# Patient Record
Sex: Female | Born: 1990 | Race: White | Hispanic: No | Marital: Married | Smoking: Never smoker
Health system: Southern US, Community
[De-identification: ages and names within clinical notes are randomized; demographics above are authoritative.]

## PROBLEM LIST (undated history)

## (undated) ENCOUNTER — Ambulatory Visit: Admission: EM | Payer: 59

## (undated) DIAGNOSIS — F32A Depression, unspecified: Secondary | ICD-10-CM

## (undated) DIAGNOSIS — R1310 Dysphagia, unspecified: Secondary | ICD-10-CM

## (undated) DIAGNOSIS — E559 Vitamin D deficiency, unspecified: Secondary | ICD-10-CM

## (undated) DIAGNOSIS — R070 Pain in throat: Secondary | ICD-10-CM

## (undated) DIAGNOSIS — F419 Anxiety disorder, unspecified: Secondary | ICD-10-CM

## (undated) HISTORY — DX: Dysphagia, unspecified: R13.10

## (undated) HISTORY — DX: Pain in throat: R07.0

## (undated) HISTORY — PX: CHOLECYSTECTOMY: SHX55

---

## 2020-04-11 ENCOUNTER — Ambulatory Visit (INDEPENDENT_AMBULATORY_CARE_PROVIDER_SITE_OTHER): Payer: 59 | Admitting: Family Medicine

## 2020-04-11 ENCOUNTER — Other Ambulatory Visit: Payer: Self-pay

## 2020-04-11 ENCOUNTER — Ambulatory Visit: Payer: Self-pay | Admitting: Family Medicine

## 2020-04-11 ENCOUNTER — Encounter: Payer: Self-pay | Admitting: Family Medicine

## 2020-04-11 VITALS — BP 120/64 | HR 96 | Temp 99.3°F | Ht 62.0 in | Wt 239.4 lb

## 2020-04-11 DIAGNOSIS — L301 Dyshidrosis [pompholyx]: Secondary | ICD-10-CM | POA: Diagnosis not present

## 2020-04-11 DIAGNOSIS — R61 Generalized hyperhidrosis: Secondary | ICD-10-CM | POA: Diagnosis not present

## 2020-04-11 MED ORDER — TRIAMCINOLONE ACETONIDE 0.1 % EX CREA: 1.0000 "application " | TOPICAL_CREAM | Freq: Two times a day (BID) | CUTANEOUS | 0 refills | Status: DC

## 2020-04-11 NOTE — Patient Instructions (Addendum)
You can use over the counter Domeboro solution if you develop oozing and blisters to dry them up  Try to keep your hands and feet dry.   Dyshidrotic Eczema Dyshidrotic eczema, also known as pompholyx, is a type of eczema that causes very itchy, fluid-filled blisters (vesicles) to form on the hands and feet. It is more common before age 32, though it can affect people of any age. There is no cure, but treatment and certain lifestyle changes can help relieve symptoms. What are the causes? The cause of this condition is not known. What increases the risk? You are more likely to develop this condition if:  You wash your hands frequently.  You have a personal or family history of eczema, allergies, asthma, or hay fever.  You are allergic to metals, such as nickel or cobalt.  You work with cement.  You smoke. What are the signs or symptoms? Symptoms of this condition may affect the hands, the feet, or both. Symptoms may come and go (recur), and may include:  Severe itching. This may happen before blisters appear.  Blisters. These may form suddenly. ? In the early stages, blisters may form near the fingertips. ? In severe cases, blisters may grow to large blister masses (bullae). ? Blisters resolve in 2-3 weeks without bursting. This is followed by a dry phase in which itching eases.  Pain and swelling.  Cracks or long, narrow openings (fissures) in the skin.  Severe dryness.  Ridges on the nails. How is this diagnosed? This condition may be diagnosed based on:  Your symptoms and a physical exam.  Your medical history.  Skin scrapings to rule out a fungal infection.  Testing a swab of fluid for bacteria (culture).  Removing a small piece of skin (biopsy) to test for infection or to rule out other conditions.  Skin patch tests. These tests involve using patches that contain possible allergens and placing them on your back. Your health care provider will wait a few days and  then check to see if an allergic reaction occurred. These tests may be done if your health care provider suspects allergic reactions, or to rule out other types of eczema. You may be referred to a health care provider who specializes in skin conditions (dermatologist) to help diagnose and treat this condition. How is this treated? There is no cure for this condition, but treatment can help relieve symptoms. Depending on the amount and severity of the blisters, your health care provider may suggest:  Avoiding allergens, irritants, or triggers that worsen symptoms. This may involve lifestyle changes, such as: ? Using different lotions or soaps. ? Avoiding hot weather or places that will cause you to sweat a lot. ? Managing stress with coping techniques, such as relaxation and exercise, and asking for help when you need it. ? Diet changes as recommended by your health care provider.  Using a clean, damp towel (cool compress) to relieve symptoms.  Soaking in a bath that contains a type of salt that relieves irritation (aluminum acetate soaks).  Medicines, such as: ? Medicine taken by mouth to reduce itching (oral antihistamines). ? Medicine applied to the skin to reduce swelling and irritation (topical corticosteroids). ? Medicine that reduces the activity of the body's disease-fighting system (immunosuppressants) to treat inflammation. This may be given in severe cases. ? Antibiotic medicines to treat bacterial infection.  Light therapy (phototherapy). This involves shining ultraviolet (UV) light on the affected skin in order to reduce itchiness and inflammation.  Follow these instructions at home: Bathing and skin care  Wash skin gently. After bathing or washing your hands, pat your skin dry. Avoid rubbing your skin.  Remove all jewelry before bathing. If the skin under the jewelry stays wet, blisters may form or get worse.  Apply cool compresses as told by your health care provider. To  do this: ? Soak a clean towel in cool water. ? Wring out excess water until towel is damp. ? Place the towel over the affected skin. Leave the towel on for 20 minutes at a time, 2-3 times a day.  Use mild soaps, cleansers, and lotions that do not contain dyes, perfumes, or other irritants.  Keep your skin hydrated. To do this: ? Avoid very hot water. Take lukewarm baths or showers. ? Apply moisturizer within 3 minutes of bathing. This locks in moisture.   Medicines  Take and apply over-the-counter and prescription medicines only as told by your health care provider.  If you were prescribed an antibiotic medicine, take or apply it as told by your health care provider. Do not stop using the antibiotic even if you start to feel better. General instructions  Do not use any products that contain nicotine or tobacco. These include cigarettes, chewing tobacco, and vaping devices, such as e-cigarettes. If you need help quitting, ask your health care provider.  Identify and avoid triggers and allergens.  Keep fingernails short to avoid breaking the skin while scratching.  Use waterproof gloves to protect your hands when doing work that keeps your hands wet for a long time.  Wear socks to keep your feet dry.  Keep all follow-up visits. This is important. Contact a health care provider if:  You have symptoms that do not go away.  You have signs of infection, such as: ? Crusting, pus, or a bad smell. ? More redness, swelling, or pain. ? Increased warmth in the affected area. Get help right away if:  Your skin gets streaking redness with associated pain. Summary  Dyshidrotic eczema, also known as pompholyx, is a type of eczema that causes very itchy, fluid-filled blisters (vesicles) to form on the hands and feet.  The cause of this condition is not known.  There is no cure for this condition, but treatment can help relieve symptoms. Treatment depends on the amount and severity of the  blisters.  Use mild soaps, cleansers, and lotions that do not contain dyes, perfumes, or other irritants. Keep your skin hydrated. This information is not intended to replace advice given to you by your health care provider. Make sure you discuss any questions you have with your health care provider. Document Revised: 11/28/2019 Document Reviewed: 11/28/2019 Elsevier Patient Education  2021 [employer].    Obgyn Offices:   [facility] [address] [place], [place] [address] [phone]  [facility] Address: [address] [place], [place] [address] Phone: [phone]  [facility] [address] [place], [place] [address] Phone: [phone]    [facility] [address] [place] [address] [place], [place] [address] Phone: [phone]
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
## 2020-04-19 ENCOUNTER — Telehealth: Payer: Self-pay | Admitting: Family Medicine

## 2020-04-19 NOTE — Telephone Encounter (Signed)
Requested records received from Dr. Chuck Hint

## 2020-05-07 ENCOUNTER — Telehealth: Payer: Self-pay | Admitting: Family Medicine

## 2020-05-07 NOTE — Telephone Encounter (Signed)
Requested records received from [facility]

## 2020-07-11 ENCOUNTER — Telehealth (INDEPENDENT_AMBULATORY_CARE_PROVIDER_SITE_OTHER): Payer: 59 | Admitting: Medical

## 2020-07-11 ENCOUNTER — Other Ambulatory Visit: Payer: Self-pay

## 2020-07-11 VITALS — BP 122/80 | HR 84 | Temp 99.1°F | Ht 62.0 in | Wt 235.0 lb

## 2020-07-11 DIAGNOSIS — Z9049 Acquired absence of other specified parts of digestive tract: Secondary | ICD-10-CM | POA: Diagnosis not present

## 2020-07-11 DIAGNOSIS — R319 Hematuria, unspecified: Secondary | ICD-10-CM | POA: Diagnosis not present

## 2020-07-11 DIAGNOSIS — R1012 Left upper quadrant pain: Secondary | ICD-10-CM | POA: Diagnosis not present

## 2020-07-11 DIAGNOSIS — R112 Nausea with vomiting, unspecified: Secondary | ICD-10-CM | POA: Insufficient documentation

## 2020-07-11 HISTORY — DX: Nausea with vomiting, unspecified: R11.2

## 2020-07-11 HISTORY — DX: Left upper quadrant pain: R10.12

## 2020-07-11 LAB — POCT URINALYSIS DIP (PROADVANTAGE DEVICE)
Bilirubin, UA: NEGATIVE
Glucose, UA: NEGATIVE mg/dL
Ketones, POC UA: NEGATIVE mg/dL
Leukocytes, UA: NEGATIVE
Nitrite, UA: NEGATIVE
Protein Ur, POC: NEGATIVE mg/dL
Specific Gravity, Urine: 1.01
Urobilinogen, Ur: 0.2
pH, UA: 6 (ref 5.0–8.0)

## 2020-07-11 MED ORDER — ONDANSETRON 4 MG PO TBDP: 4.0000 mg | ORAL_TABLET | Freq: Three times a day (TID) | ORAL | 0 refills | Status: DC | PRN

## 2020-07-11 NOTE — Progress Notes (Signed)
Subjective: Chief Complaint  Patient presents with  . Abdominal Pain    Nausea, vomiting and stomach pain started yesterday   Here for abdominal pain.  Started yesterday morning with pain in the upper abdomen mostly, left sometimes radiating to the right.  Had nausea and vomiting last night and had some more nausea and vomiting today at lunch.  Cannot keep anything down.  She has persistent nausea.  The pain is intermittent.  She does get some pain worse after eating. She has a history of chronic loose stools and she did have a bowel movement Monday that was loose.  No bowel movement so far the last 2 days.  he denies any urinary symptoms.  No burning with urination, no frequency, no urgency, no blood in the urine or stool, no blood in the vomit.  She has a history of gallbladder removed in the past.  No history of kidney stones.  Some left back pain.  No shoulder pain, no fever.  No sick contacts.  No respiratory complaint.  She notes 1 prior episode of pancreatitis in the remote past at the same time she was having gallbladder issues but none since.  She occasionally drinks an alcoholic beverage but not regularly and none in the last few days.  No injury or trauma. She does take ibuprofen daily once daily.  Sometimes in past GERD.  She notes hx/o hiatal hernia prior. She notes occasional alcohol use but none heavy.    No concern for pregnancy.  Has had tubal and has IUD.  No other aggravating or relieving factors.    No other c/o.   No past medical history on file.  Current Outpatient Medications on File Prior to Visit  Medication Sig Dispense Refill  . cetirizine (ZYRTEC) 10 MG tablet Take by mouth.    . clonazePAM (KLONOPIN) 0.5 MG tablet Take 0.5 mg by mouth daily as needed.    . ergocalciferol (VITAMIN D2) 1.25 MG (50000 UT) capsule     . ibuprofen (ADVIL) 600 MG tablet     . loratadine (CLARITIN) 10 MG tablet Take by mouth.    . prazosin (MINIPRESS) 5 MG capsule     . sertraline  (ZOLOFT) 100 MG tablet     . traZODone (DESYREL) 150 MG tablet Take 150 mg by mouth at bedtime.    . triamcinolone (KENALOG) 0.1 % Apply 1 application topically 2 (two) times daily. 30 g 0  . busPIRone (BUSPAR) 7.5 MG tablet Take 7.5 mg by mouth 3 (three) times daily. (Patient not taking: Reported on 07/11/2020)    . QUEtiapine (SEROQUEL) 50 MG tablet Take 50 mg by mouth at bedtime. (Patient not taking: Reported on 07/11/2020)     No current facility-administered medications on file prior to visit.     The following portions of the patient's history were reviewed and updated as appropriate: allergies, current medications, past family history, past medical history, past social history, past surgical history and problem list.  ROS Otherwise as in subjective above    Objective: Ht 5\' 2"  (1.575 m)   Wt 235 lb (106.6 kg)   BMI 42.98 kg/m   General appearance: alert, no distress, well developed, well nourished, white female Oral cavity: MMM, no lesions Neck: supple, no lymphadenopathy, no thyromegaly, no masses Heart: RRR, normal S1, S2, no murmurs Lungs: CTA bilaterally, no wheezes, rhonchi, or rales Abdomen: +somewhat decreased bs, port surgical scars noted right side, soft, mild to moderate generalized left upper and epigastric tenderness, otherwise non  tender, non distended, no masses, no hepatomegaly, no splenomegaly Back: nontender, no CVA tenderness Pulses: 2+ radial pulses, 2+ pedal pulses, normal cap refill Ext: no edema     Assessment: Encounter Diagnoses  Name Primary?  . Left upper quadrant abdominal pain Yes  . Nausea and vomiting, intractability of vomiting not specified, unspecified vomiting type   . S/P cholecystectomy      Plan: Discussed symptoms, possible differential which could include dyspepsia, hiatal hernia, GERD, pancreatitis or other.  Labs today as below.  She will use Tylenol for pain.  She declined stronger pain medication.  I advise no solid food  for the next 24 hours only clear fluids.  Can use Zofran for nausea.  Follow-up pending labs.  If worse in the meantime go to the emergency department  Hollee was seen today for abdominal pain.  Diagnoses and all orders for this visit:  Left upper quadrant abdominal pain -     CBC with Differential/Platelet -     Lipase -     Comprehensive metabolic panel -     POCT Urinalysis DIP (Proadvantage Device) -     POCT urine pregnancy  Nausea and vomiting, intractability of vomiting not specified, unspecified vomiting type -     CBC with Differential/Platelet -     Lipase -     Comprehensive metabolic panel -     POCT Urinalysis DIP (Proadvantage Device)  S/P cholecystectomy  Other orders -     ondansetron (ZOFRAN ODT) 4 MG disintegrating tablet; Take 1 tablet (4 mg total) by mouth every 8 (eight) hours as needed for nausea or vomiting.    Follow up: pending labs

## 2020-07-11 NOTE — Addendum Note (Signed)
Addended by: Victorio Palm on: 07/11/2020 04:11 PM   Modules accepted: Orders

## 2020-07-12 LAB — CBC WITH DIFFERENTIAL/PLATELET
Basophils Absolute: 0.1 10*3/uL (ref 0.0–0.2)
Basos: 1 %
EOS (ABSOLUTE): 0.3 10*3/uL (ref 0.0–0.4)
Eos: 3 %
Hematocrit: 40.1 % (ref 34.0–46.6)
Hemoglobin: 13.7 g/dL (ref 11.1–15.9)
Immature Grans (Abs): 0 10*3/uL (ref 0.0–0.1)
Immature Granulocytes: 0 %
Lymphocytes Absolute: 2.5 10*3/uL (ref 0.7–3.1)
Lymphs: 27 %
MCH: 31.8 pg (ref 26.6–33.0)
MCHC: 34.2 g/dL (ref 31.5–35.7)
MCV: 93 fL (ref 79–97)
Monocytes Absolute: 0.7 10*3/uL (ref 0.1–0.9)
Monocytes: 7 %
Neutrophils Absolute: 5.6 10*3/uL (ref 1.4–7.0)
Neutrophils: 62 %
Platelets: 275 10*3/uL (ref 150–450)
RBC: 4.31 x10E6/uL (ref 3.77–5.28)
RDW: 11.8 % (ref 11.7–15.4)
WBC: 9.2 10*3/uL (ref 3.4–10.8)

## 2020-07-12 LAB — COMPREHENSIVE METABOLIC PANEL
ALT: 45 IU/L — ABNORMAL HIGH (ref 0–32)
AST: 23 IU/L (ref 0–40)
Albumin/Globulin Ratio: 1.9 (ref 1.2–2.2)
Albumin: 4.5 g/dL (ref 3.8–4.8)
Alkaline Phosphatase: 79 IU/L (ref 44–121)
BUN/Creatinine Ratio: 15 (ref 9–23)
BUN: 12 mg/dL (ref 6–20)
Bilirubin Total: 0.4 mg/dL (ref 0.0–1.2)
CO2: 23 mmol/L (ref 20–29)
Calcium: 9.1 mg/dL (ref 8.7–10.2)
Chloride: 104 mmol/L (ref 96–106)
Creatinine, Ser: 0.81 mg/dL (ref 0.57–1.00)
Globulin, Total: 2.4 g/dL (ref 1.5–4.5)
Glucose: 89 mg/dL (ref 65–99)
Potassium: 4.3 mmol/L (ref 3.5–5.2)
Sodium: 139 mmol/L (ref 134–144)
Total Protein: 6.9 g/dL (ref 6.0–8.5)
eGFR: 99 mL/min/{1.73_m2} (ref >59)

## 2020-07-12 LAB — LIPASE: Lipase: 26 U/L (ref 14–72)

## 2020-07-14 LAB — URINE CULTURE

## 2020-10-16 ENCOUNTER — Other Ambulatory Visit: Payer: Self-pay

## 2020-10-16 ENCOUNTER — Ambulatory Visit: Payer: 59 | Admitting: Podiatry

## 2020-10-16 ENCOUNTER — Ambulatory Visit (INDEPENDENT_AMBULATORY_CARE_PROVIDER_SITE_OTHER): Payer: 59

## 2020-10-16 DIAGNOSIS — L600 Ingrowing nail: Secondary | ICD-10-CM

## 2020-10-16 DIAGNOSIS — M778 Other enthesopathies, not elsewhere classified: Secondary | ICD-10-CM

## 2020-10-16 MED ORDER — NEOMYCIN-POLYMYXIN-HC 1 % OT SOLN: OTIC | 1 refills | Status: DC

## 2020-10-16 NOTE — Patient Instructions (Addendum)

## 2020-10-17 NOTE — Progress Notes (Signed)
Subjective:  Patient ID: Cynthia Frazier, female    DOB: 04-Jan-1991,  MRN: [id number] HPI Chief Complaint  Patient presents with   Foot Pain    left foot pain, ingrown great toenail    30 y.o. female presents with the above complaint.   ROS: Denies fever chills nausea vomiting muscle aches pains calf pain back pain chest pain shortness of breath.  No past medical history on file. No past surgical history on file.  Current Outpatient Medications:    NEOMYCIN-POLYMYXIN-HYDROCORTISONE (CORTISPORIN) 1 % SOLN OTIC solution, Apply 1-2 drops to toe BID after soaking, Disp: 10 mL, Rfl: 1   busPIRone (BUSPAR) 7.5 MG tablet, Take 7.5 mg by mouth 3 (three) times daily. (Patient not taking: Reported on 07/11/2020), Disp: , Rfl:    cetirizine (ZYRTEC) 10 MG tablet, Take by mouth., Disp: , Rfl:    clonazePAM (KLONOPIN) 0.5 MG tablet, Take 0.5 mg by mouth daily as needed., Disp: , Rfl:    ergocalciferol (VITAMIN D2) 1.25 MG (50000 UT) capsule, , Disp: , Rfl:    ibuprofen (ADVIL) 600 MG tablet, , Disp: , Rfl:    loratadine (CLARITIN) 10 MG tablet, Take by mouth., Disp: , Rfl:    ondansetron (ZOFRAN ODT) 4 MG disintegrating tablet, Take 1 tablet (4 mg total) by mouth every 8 (eight) hours as needed for nausea or vomiting., Disp: 20 tablet, Rfl: 0   prazosin (MINIPRESS) 5 MG capsule, , Disp: , Rfl:    QUEtiapine (SEROQUEL) 50 MG tablet, Take 50 mg by mouth at bedtime. (Patient not taking: Reported on 07/11/2020), Disp: , Rfl:    sertraline (ZOLOFT) 100 MG tablet, , Disp: , Rfl:    traZODone (DESYREL) 150 MG tablet, Take 150 mg by mouth at bedtime., Disp: , Rfl:    triamcinolone (KENALOG) 0.1 %, Apply 1 application topically 2 (two) times daily., Disp: 30 g, Rfl: 0  Allergies  Allergen Reactions   Sumatriptan Anaphylaxis   Vancomycin Anaphylaxis   Latex Hives   Levofloxacin Rash   Review of Systems Objective:  There were no vitals filed for this visit.  General: Well developed, nourished, in no  acute distress, alert and oriented x3   Dermatological: Skin is warm, dry and supple bilateral. Nails x 10 are well maintained; remaining integument appears unremarkable at this time. There are no open sores, no preulcerative lesions, no rash or signs of infection present.  Sharp incurvated nail margin along the tibiofibular border of the hallux bilaterally.  Mild erythema no purulence no malodor  Vascular: Dorsalis Pedis artery and Posterior Tibial artery pedal pulses are 2/4 bilateral with immedate capillary fill time. Pedal hair growth present. No varicosities and no lower extremity edema present bilateral.   Neruologic: Grossly intact via light touch bilateral. Vibratory intact via tuning fork bilateral. Protective threshold with Semmes Wienstein monofilament intact to all pedal sites bilateral. Patellar and Achilles deep tendon reflexes 2+ bilateral. No Babinski or clonus noted bilateral.   Musculoskeletal: No gross boney pedal deformities bilateral. No pain, crepitus, or limitation noted with foot and ankle range of motion bilateral. Muscular strength 5/5 in all groups tested bilateral.  Gait: Unassisted, Nonantalgic.    Radiographs:  None taken  Assessment & Plan:   Assessment: Ingrown nails hallux bilateral  Plan: Discussed etiology pathology conservative surgical therapies at this point in time went ahead and performed a chemical matricectomy to the tibiofibular border of the hallux bilateral after local anesthetic was administered.  She tolerated procedure well without complications.  Provided her  with both oral and written home-going instruction for the care and soaking of the foot.  Also provided her with a prescription for Corticosporin otic to be applied twice daily after soaking.     [name] T. Buckner, [place]

## 2020-10-30 ENCOUNTER — Ambulatory Visit (INDEPENDENT_AMBULATORY_CARE_PROVIDER_SITE_OTHER): Payer: 59 | Admitting: Podiatry

## 2020-10-30 ENCOUNTER — Encounter: Payer: Self-pay | Admitting: Podiatry

## 2020-10-30 ENCOUNTER — Other Ambulatory Visit: Payer: Self-pay

## 2020-10-30 DIAGNOSIS — L03032 Cellulitis of left toe: Secondary | ICD-10-CM

## 2020-10-30 MED ORDER — DOXYCYCLINE HYCLATE 100 MG PO TABS
100.0000 mg | ORAL_TABLET | Freq: Two times a day (BID) | ORAL | 0 refills | Status: DC
Start: 2020-10-30 — End: 2021-01-29

## 2020-10-30 NOTE — Progress Notes (Signed)
She presents today for follow-up of her matricectomy's hallux bilateral.  States the right was doing great the left with is a little red with drainage.  Continues to soak Epson salts and warm water and vinegar and warm water.  Objective: Vital signs are stable alert oriented x3.  There is no erythema right left foot does demonstrate some mild erythema with serosanguineous drainage tibial border hallux left.  Assessment: Mild paronychia hallux left well-healing surgical toe hallux right.  Plan: At this point start her on doxycycline continue Epson salts and warm water soaks bilaterally follow-up with me in 2 weeks

## 2020-11-13 ENCOUNTER — Other Ambulatory Visit (INDEPENDENT_AMBULATORY_CARE_PROVIDER_SITE_OTHER): Payer: 59

## 2020-11-13 ENCOUNTER — Other Ambulatory Visit: Payer: Self-pay

## 2020-11-13 DIAGNOSIS — Z23 Encounter for immunization: Secondary | ICD-10-CM

## 2020-11-22 ENCOUNTER — Encounter: Payer: Self-pay | Admitting: Podiatry

## 2020-11-22 ENCOUNTER — Other Ambulatory Visit: Payer: Self-pay

## 2020-11-22 ENCOUNTER — Ambulatory Visit (INDEPENDENT_AMBULATORY_CARE_PROVIDER_SITE_OTHER): Payer: 59 | Admitting: Podiatry

## 2020-11-22 DIAGNOSIS — Z9889 Other specified postprocedural states: Secondary | ICD-10-CM | POA: Diagnosis not present

## 2020-11-22 DIAGNOSIS — L03032 Cellulitis of left toe: Secondary | ICD-10-CM

## 2020-11-22 NOTE — Progress Notes (Signed)
She presents today for follow-up of her matrixectomy that was performed back on 10/16/2020.  States that just has not healed up on the side both other sites have healed up nicely with exception of this point.  She states has been soaking and trying to get to heal as well as taking the antibiotics.  Objective: Vital signs stable alert oriented x3 fibular border of the hallux left still demonstrates tenderness moderate erythema no cellulitis drainage or odor.  Assessment: Mild paronychia fibular border hallux left.  Plan: Went ahead and numbed the toe up again today and performed an I&D he did have a small spicule of nail along the proximal nail fold laterally which was removed.  This should alleviate her symptoms 100% there is no signs of infection proximally.  She was provided with both oral written home-going instruction for the care and soaking the toe and I will follow-up with her on an as-needed basis.  She will follow-up with me for capsulitis also of that foot.

## 2020-12-26 ENCOUNTER — Encounter: Payer: Self-pay | Admitting: Family Medicine

## 2021-01-01 ENCOUNTER — Ambulatory Visit: Payer: 59 | Admitting: Podiatry

## 2021-01-10 ENCOUNTER — Ambulatory Visit: Payer: 59

## 2021-01-10 ENCOUNTER — Encounter: Payer: 59 | Admitting: Podiatry

## 2021-01-10 DIAGNOSIS — M778 Other enthesopathies, not elsewhere classified: Secondary | ICD-10-CM

## 2021-01-10 NOTE — Progress Notes (Signed)
This encounter was created in error - please disregard.

## 2021-01-29 ENCOUNTER — Other Ambulatory Visit: Payer: Self-pay

## 2021-01-29 ENCOUNTER — Ambulatory Visit
Admission: EM | Admit: 2021-01-29 | Discharge: 2021-01-29 | Disposition: A | Discharge status: Home / Self Care | Payer: 59 | Attending: Emergency Medicine | Admitting: Emergency Medicine

## 2021-01-29 DIAGNOSIS — J101 Influenza due to other identified influenza virus with other respiratory manifestations: Secondary | ICD-10-CM

## 2021-01-29 DIAGNOSIS — J9801 Acute bronchospasm: Secondary | ICD-10-CM

## 2021-01-29 DIAGNOSIS — J208 Acute bronchitis due to other specified organisms: Secondary | ICD-10-CM

## 2021-01-29 MED ORDER — METHYLPREDNISOLONE SODIUM SUCC 125 MG IJ SOLR
125.0000 mg | Freq: Once | INTRAMUSCULAR | Status: AC
Start: 2021-01-29 — End: 2021-01-29
  Administered 2021-01-29: 125 mg via INTRAMUSCULAR

## 2021-01-29 MED ORDER — PROMETHAZINE-DM 6.25-15 MG/5ML PO SYRP: 5.0000 mL | ORAL_SOLUTION | Freq: Four times a day (QID) | ORAL | 0 refills | Status: DC | PRN

## 2021-01-29 NOTE — Discharge Instructions (Addendum)
Your symptoms are most consistent with a viral upper respiratory illness.  Rapid influenza testing today was positive for influenza A.  Given the duration of your illness, Tamiflu is no longer indicated and would be of no benefit to you.  For acute bronchospasm appreciated on physical exam today, as well as your prolonged cough, I think it is likely that you have again developed bronchitis secondary to your viral infection with influenza.  This makes sense because azithromycin would have no effect on a viral bronchitis.    You were provided with an injection of Solu-Medrol, also noticed methylprednisolone, in the office today, this seemed to significantly improve your work of breathing so recommend that you continue a tapering dose of methylprednisolone to help keep your lungs calm, this will hasten your recovery from influenza which has been prolonged by the bronchitis.  I also provided you with a cough syrup called Promethazine DM that she can take 4 times daily to suppress your cough, it will also help you sleep at night.  Please remain home from work, school, public places until you you are feeling significantly better and not having any fever.  I provided you with a note for work.  Conservative care is also recommended at this time.  This includes rest, pushing clear fluids and activity as tolerated.  You may also noticed that your appetite is reduced, this is okay as long as they are drinking plenty of clear fluids.  Acetaminophen (Tylenol): This is a good fever reducer.  If there body temperature rises above 101.5 as measured with a thermometer, it is recommended that you give them 1,000 mg every 6-8 hours until they are temperature falls below 101.5, please not take more than 3,000 mg of acetaminophen either as a separate medication or as in ingredient in an over-the-counter cold/flu preparation within a 24-hour period  Ibuprofen  (Advil, Motrin): This is a good anti-inflammatory medication  which addresses aches and pains and, to some degree, congestion in the nasal passages.  I recommend giving between 400 to 600 mg every 6-8 hours as needed.  Pseudoephedrine (Sudafed): This is a decongestant.  This medication has to be purchased from the pharmacist counter, I recommend giving 2 tablets, 60 mg, 2-3 times a day as needed to relieve runny nose and sinus drainage.  Guaifenesin (Robitussin, Mucinex): This is an expectorant.  This helps break up chest congestion and loosen up thick nasal drainage making phlegm and drainage more liquid and therefore easier to remove.  I recommend being 400 mg three times daily as needed.  Dextromethorphan (any cough medicine with the letters "DM" added to it's name such as Robitussin DM): This is a cough suppressant.  This is often recommended to be taken at nighttime to suppress cough and help children sleep.  Give dosage as directed on the bottle.   Chloraseptic Throat Spray: Spray 5 sprays into affected area every 2 hours, hold for 15 seconds and either swallow or spit it out.  This is a excellent numbing medication because it is a spray, you can put it right where you needed and so sucking on a lozenge and numbing your entire mouth.  Based on my physical exam findings and the history provided  today, I do not see any evidence of bacterial infection therefore treatment with antibiotics would be of no benefit.  Please follow-up within the next 3 to 5 days either with your primary care provider or urgent care if your symptoms do not resolve.  If you  do not have a primary care provider, we will assist you in finding one.

## 2021-01-29 NOTE — ED Provider Notes (Signed)
[facility]    CSN: [id number] Arrival date & time: 01/29/21  1134    HISTORY  No chief complaint on file.  HPI Cynthia Frazier is a 30 y.o. female. Patient states on November 11 she began to have a cough which lingered for a few weeks then she sought medical attention on November 23 because she began to have sinus pressure, sinus headache.  Patient was prescribed azithromycin, states she took all as prescribed, states she now has worsening cough, chest feels tight, increased work of breathing.  Patient states she is also had headache, body aches, intermittent low-grade fever, states cough is productive of sputum, states what she coughed so hard that she threw up food.  Patient states she also was having clear sinus drainage.  Patient states that no one in her household is sick.  Patient denies known sick contacts.  The history is provided by the patient.  History reviewed. No pertinent past medical history. Patient Active Problem List   Diagnosis Date Noted   Left upper quadrant abdominal pain 07/11/2020   Nausea and vomiting 07/11/2020   S/P cholecystectomy 07/11/2020   Past Surgical History:  Procedure Laterality Date   CESAREAN SECTION     CHOLECYSTECTOMY     OB History   No obstetric history on file.    Home Medications    Prior to Admission medications   Medication Sig Start Date End Date Taking? Authorizing Provider  busPIRone (BUSPAR) 15 MG tablet Take 15 mg by mouth 3 (three) times daily. 10/17/20   [provider]  cetirizine (ZYRTEC) 10 MG tablet Take by mouth.    [provider]  clonazePAM (KLONOPIN) 0.5 MG tablet Take 0.5 mg by mouth daily as needed. 04/10/20   [provider]  doxepin (SINEQUAN) 25 MG capsule Take 25 mg by mouth at bedtime. 10/17/20   [provider]  ergocalciferol (VITAMIN D2) 1.25 MG (50000 UT) capsule  12/23/19   [provider]  ibuprofen (ADVIL) 600 MG tablet  10/21/19   [provider]  loratadine (CLARITIN) 10 MG tablet Take by mouth.    [provider]  promethazine-dextromethorphan (PROMETHAZINE-DM) 6.25-15 MG/5ML syrup Take 5 mLs by mouth 4 (four) times daily as needed for cough. 01/29/21  Yes Theadora Rama Scales, PA-C  triamcinolone (KENALOG) 0.1 % Apply 1 application topically 2 (two) times daily. 04/11/20   Avanell Shackleton, PA-C   Family History History reviewed. No pertinent family history. Social History Social History   Tobacco Use   Smoking status: Never   Smokeless tobacco: Never  Substance Use Topics   Alcohol use: Yes   Drug use: Never   Allergies   Sumatriptan, Vancomycin, Latex, and Levofloxacin  Review of Systems Review of Systems Pertinent findings noted in history of present illness.   Physical Exam Triage Vital Signs ED Triage Vitals  Enc Vitals Group     BP 12/28/20 0827 (!) 147/82     Pulse Rate 12/28/20 0827 72     Resp 12/28/20 0827 18     Temp 12/28/20 0827 98.3 F (36.8 C)     Temp Source 12/28/20 0827 Oral     SpO2 12/28/20 0827 98 %     Weight --      Height --      Head Circumference --      Peak Flow --      Pain Score 12/28/20 0826 5     Pain Loc --  Pain Edu? --      Excl. in GC? --   No data found.  Updated Vital Signs BP (!) 157/86 (BP Location: Left Arm)   Pulse 74   Temp 98.9 F (37.2 C) (Oral)   Resp 18   SpO2 96%   Physical Exam Vitals and nursing note reviewed.  Constitutional:      General: She is not in acute distress.    Appearance: Normal appearance. She is ill-appearing.  HENT:     Head: Normocephalic and atraumatic.     Salivary Glands: Right salivary gland is not diffusely enlarged or tender. Left salivary gland is not diffusely enlarged or tender.     Right Ear: Tympanic membrane, ear canal and external ear normal. No drainage. No middle ear effusion. There is no impacted cerumen. Tympanic membrane is not erythematous or bulging.     Left Ear: Tympanic  membrane, ear canal and external ear normal. No drainage.  No middle ear effusion. There is no impacted cerumen. Tympanic membrane is not erythematous or bulging.     Nose: Mucosal edema, congestion and rhinorrhea present. No nasal deformity or septal deviation. Rhinorrhea is clear.     Right Turbinates: Not enlarged, swollen or pale.     Left Turbinates: Not enlarged, swollen or pale.     Right Sinus: No maxillary sinus tenderness or frontal sinus tenderness.     Left Sinus: No maxillary sinus tenderness or frontal sinus tenderness.     Mouth/Throat:     Lips: Pink. No lesions.     Mouth: Mucous membranes are moist. No oral lesions.     Pharynx: Uvula midline. Pharyngeal swelling, posterior oropharyngeal erythema and uvula swelling present.     Tonsils: No tonsillar exudate. 0 on the right. 0 on the left.  Eyes:     General: Lids are normal.        Right eye: No discharge.        Left eye: No discharge.     Extraocular Movements: Extraocular movements intact.     Conjunctiva/sclera: Conjunctivae normal.     Right eye: Right conjunctiva is not injected.     Left eye: Left conjunctiva is not injected.  Neck:     Trachea: Trachea and phonation normal.  Cardiovascular:     Rate and Rhythm: Normal rate and regular rhythm.     Pulses: Normal pulses.     Heart sounds: Normal heart sounds. No murmur heard.   No friction rub. No gallop.  Pulmonary:     Effort: Pulmonary effort is normal. No accessory muscle usage, prolonged expiration or respiratory distress.     Breath sounds: No stridor, decreased air movement or transmitted upper airway sounds. No decreased breath sounds, wheezing, rhonchi or rales.     Comments: Prolonged exhalation with cough, bronchospasm Chest:     Chest wall: No tenderness.  Musculoskeletal:        General: Normal range of motion.     Cervical back: Normal range of motion and neck supple. Normal range of motion.  Lymphadenopathy:     Cervical: Cervical adenopathy  present.     Right cervical: Superficial cervical adenopathy and posterior cervical adenopathy present.     Left cervical: Superficial cervical adenopathy and posterior cervical adenopathy present.  Skin:    General: Skin is warm.     Findings: No erythema or rash.  Neurological:     General: No focal deficit present.     Mental Status: She is alert and  oriented to person, place, and time.  Psychiatric:        Mood and Affect: Mood normal.        Behavior: Behavior normal.    Visual Acuity Right Eye Distance:   Left Eye Distance:   Bilateral Distance:    Right Eye Near:   Left Eye Near:    Bilateral Near:     UC Couse / Diagnostics / Procedures:    EKG  Radiology No results found.  Procedures Procedures (including critical care time)  UC Diagnoses / Final Clinical Impressions(s)   I have reviewed the triage vital signs and the nursing notes.  Pertinent labs & imaging results that were available during my care of the patient were reviewed by me and considered in my medical decision making (see chart for details).   Final diagnoses:  Influenza A  Acute bronchitis due to other specified organisms  Bronchospasm, acute   Viral bronchitis.  Patient had significant improvement of work of breathing and breath sounds after Solu-Medrol injection, Medrol Dosepak prescription provided, return precautions advised.  Conservative care recommended.  Disposition Upon Discharge:  Condition: stable for discharge home Home: take medications as prescribed; routine discharge instructions as discussed; follow up as advised.  Patient presented with an acute illness with associated systemic symptoms and significant discomfort requiring urgent management. In my opinion, this is a condition that a prudent lay person (someone who possesses an average knowledge of health and medicine) may potentially expect to result in complications if not addressed urgently such as respiratory distress,  impairment of bodily function or dysfunction of bodily organs.   Routine symptom specific, illness specific and/or disease specific instructions were discussed with the patient and/or caregiver at length.   As such, the patient has been evaluated and assessed, work-up was performed and treatment was provided in alignment with urgent care protocols and evidence based medicine.  Patient/parent/caregiver has been advised that the patient may require follow up for further testing and treatment if the symptoms continue in spite of treatment, as clinically indicated and appropriate.  The patient was tested for COVID-19, Influenza and/or RSV, then the patient/parent/guardian was advised to isolate at home pending the results of his/her diagnostic coronavirus test and potentially longer if they're positive. I have also advised pt that if his/her COVID-19 test returns positive, it's recommended to self-isolate for at least 10 days after symptoms first appeared AND until fever-free for 24 hours without fever reducer AND other symptoms have improved or resolved. Discussed self-isolation recommendations as well as instructions for household member/close contacts as per the [facility] and [place] DHHS, and also gave patient the COVID packet with this information.  Patient/parent/caregiver has been advised to return to the [facility] or PCP in 3-5 days if no better; to PCP or the Emergency Department if new signs and symptoms develop, or if the current signs or symptoms continue to change or worsen for further workup, evaluation and treatment as clinically indicated and appropriate  The patient will follow up with their current PCP if and as advised. If the patient does not currently have a PCP we will assist them in obtaining one.   The patient may need specialty follow up if the symptoms continue, in spite of conservative treatment and management, for further workup, evaluation, consultation and treatment as clinically indicated and  appropriate.  Patient/parent/caregiver verbalized understanding and agreement of plan as discussed.  All questions were addressed during visit.  Please see discharge instructions below for further details of plan.  ED Prescriptions     Medication Sig Dispense Auth. Provider   promethazine-dextromethorphan (PROMETHAZINE-DM) 6.25-15 MG/5ML syrup Take 5 mLs by mouth 4 (four) times daily as needed for cough. 118 mL Theadora Rama Scales, PA-C      PDMP not reviewed this encounter.  Pending results:  Labs Reviewed - No data to display  Medications Ordered in UC: Medications  methylPREDNISolone sodium succinate (SOLU-MEDROL) 125 mg/2 mL injection 125 mg (125 mg Intramuscular Given 01/29/21 1403)    Discharge Instructions:   Discharge Instructions      Your symptoms are most consistent with a viral upper respiratory illness.  Rapid influenza testing today was positive for influenza A.  Given the duration of your illness, Tamiflu is no longer indicated and would be of no benefit to you.  For acute bronchospasm appreciated on physical exam today, as well as your prolonged cough, I think it is likely that you have again developed bronchitis secondary to your viral infection with influenza.  This makes sense because azithromycin would have no effect on a viral bronchitis.    You were provided with an injection of Solu-Medrol, also noticed methylprednisolone, in the office today, this seemed to significantly improve your work of breathing so recommend that you continue a tapering dose of methylprednisolone to help keep your lungs calm, this will hasten your recovery from influenza which has been prolonged by the bronchitis.  I also provided you with a cough syrup called Promethazine DM that she can take 4 times daily to suppress your cough, it will also help you sleep at night.  Please remain home from work, school, public places until you you are feeling significantly better and not having  any fever.  I provided you with a note for work.  Conservative care is also recommended at this time.  This includes rest, pushing clear fluids and activity as tolerated.  You may also noticed that your appetite is reduced, this is okay as long as they are drinking plenty of clear fluids.  Acetaminophen (Tylenol): This is a good fever reducer.  If there body temperature rises above 101.5 as measured with a thermometer, it is recommended that you give them 1,000 mg every 6-8 hours until they are temperature falls below 101.5, please not take more than 3,000 mg of acetaminophen either as a separate medication or as in ingredient in an over-the-counter cold/flu preparation within a 24-hour period  Ibuprofen  (Advil, Motrin): This is a good anti-inflammatory medication which addresses aches and pains and, to some degree, congestion in the nasal passages.  I recommend giving between 400 to 600 mg every 6-8 hours as needed.  Pseudoephedrine (Sudafed): This is a decongestant.  This medication has to be purchased from the pharmacist counter, I recommend giving 2 tablets, 60 mg, 2-3 times a day as needed to relieve runny nose and sinus drainage.  Guaifenesin (Robitussin, Mucinex): This is an expectorant.  This helps break up chest congestion and loosen up thick nasal drainage making phlegm and drainage more liquid and therefore easier to remove.  I recommend being 400 mg three times daily as needed.  Dextromethorphan (any cough medicine with the letters "DM" added to it's name such as Robitussin DM): This is a cough suppressant.  This is often recommended to be taken at nighttime to suppress cough and help children sleep.  Give dosage as directed on the bottle.   Chloraseptic Throat Spray: Spray 5 sprays into affected area every 2 hours, hold for 15 seconds and either  swallow or spit it out.  This is a excellent numbing medication because it is a spray, you can put it right where you needed and so sucking on a  lozenge and numbing your entire mouth.  Based on my physical exam findings and the history provided  today, I do not see any evidence of bacterial infection therefore treatment with antibiotics would be of no benefit.  Please follow-up within the next 3 to 5 days either with your primary care provider or urgent care if your symptoms do not resolve.  If you do not have a primary care provider, we will assist you in finding one.         Theadora Rama Scales, PA-C 01/29/21 1409

## 2021-01-29 NOTE — ED Triage Notes (Signed)
Pt reports having a cough since the 11th, she was recently on abx for sinus infection. Patient states she has congestion.

## 2021-01-30 ENCOUNTER — Ambulatory Visit (HOSPITAL_COMMUNITY): Payer: Self-pay

## 2021-01-30 ENCOUNTER — Telehealth: Payer: Self-pay

## 2021-01-30 MED ORDER — METHYLPREDNISOLONE 4 MG PO TBPK: ORAL_TABLET | ORAL | 0 refills | Status: DC

## 2021-01-30 NOTE — Telephone Encounter (Signed)
Patient seen January 29, 2021, prescription for Medrol Dosepak was not sent as advised.  Prescription sent.

## 2021-01-30 NOTE — Telephone Encounter (Signed)
Patient called about medications, all questions answered.

## 2021-05-07 ENCOUNTER — Other Ambulatory Visit: Payer: Self-pay

## 2021-05-07 ENCOUNTER — Ambulatory Visit
Admission: RE | Admit: 2021-05-07 | Discharge: 2021-05-07 | Disposition: A | Discharge status: Home / Self Care | Payer: 59 | Source: Ambulatory Visit | Attending: Internal Medicine | Admitting: Internal Medicine

## 2021-05-07 VITALS — BP 129/85 | HR 112 | Temp 100.4°F | Resp 18

## 2021-05-07 DIAGNOSIS — J069 Acute upper respiratory infection, unspecified: Secondary | ICD-10-CM | POA: Diagnosis not present

## 2021-05-07 LAB — POCT INFLUENZA A/B
Influenza A, POC: NEGATIVE
Influenza B, POC: NEGATIVE

## 2021-05-07 MED ORDER — FLUTICASONE PROPIONATE 50 MCG/ACT NA SUSP: 1.0000 | Freq: Every day | NASAL | 0 refills | Status: DC

## 2021-05-07 MED ORDER — BENZONATATE 100 MG PO CAPS: 100.0000 mg | ORAL_CAPSULE | Freq: Three times a day (TID) | ORAL | 0 refills | Status: DC | PRN

## 2021-05-07 NOTE — ED Triage Notes (Signed)
Pt here for fever, productive cough and nasal congestion x 3 days; fever started yesterday ?

## 2021-05-07 NOTE — ED Provider Notes (Signed)
?[facility] ? ? ? ?CSN: [id number] ?Arrival date & time: 05/07/21  0946 ? ? ?  ? ?History   ?Chief Complaint ?Chief Complaint  ?Patient presents with  ? Appointment  ?  1000  ? Fever  ? Cough  ? ? ?HPI ?Cynthia Frazier is a 31 y.o. female.  ? ?Patient presents with fever, cough, nasal congestion that started approximately 3 days ago.  Her child has similar symptoms currently.  Tmax at home was 102.  Denies chest pain, shortness of breath, sore throat, ear pain, nausea, vomiting, diarrhea, abdominal pain.  Patient has taken over-the-counter cold and flu medications with minimal improvement in symptoms. ? ? ?Fever ?Cough ? ?History reviewed. No pertinent past medical history. ? ?Patient Active Problem List  ? Diagnosis Date Noted  ? Left upper quadrant abdominal pain 07/11/2020  ? Nausea and vomiting 07/11/2020  ? S/P cholecystectomy 07/11/2020  ? ? ?Past Surgical History:  ?Procedure Laterality Date  ? CESAREAN SECTION    ? CHOLECYSTECTOMY    ? ? ?OB History   ?No obstetric history on file. ?  ? ? ? ?Home Medications   ? ?Prior to Admission medications   ?Medication Sig Start Date End Date Taking? Authorizing Provider  ?benzonatate (TESSALON) 100 MG capsule Take 1 capsule (100 mg total) by mouth every 8 (eight) hours as needed for cough. 05/07/21  Yes Gustavus Bryant, FNP  ?fluticasone (FLONASE) 50 MCG/ACT nasal spray Place 1 spray into both nostrils daily for 3 days. 05/07/21 05/10/21 Yes Tangia Pinard, Acie Fredrickson, FNP  ?busPIRone (BUSPAR) 15 MG tablet Take 15 mg by mouth 3 (three) times daily. 10/17/20   [provider]  ?cetirizine (ZYRTEC) 10 MG tablet Take by mouth.    [provider]  ?clonazePAM (KLONOPIN) 0.5 MG tablet Take 0.5 mg by mouth daily as needed. 04/10/20   [provider]  ?doxepin (SINEQUAN) 25 MG capsule Take 25 mg by mouth at bedtime. 10/17/20   [provider]  ?ergocalciferol (VITAMIN D2) 1.25 MG (50000 UT) capsule  12/23/19   [provider]  ?ibuprofen  (ADVIL) 600 MG tablet  10/21/19   [provider]  ?loratadine (CLARITIN) 10 MG tablet Take by mouth.    [provider]  ?methylPREDNISolone (MEDROL DOSEPAK) 4 MG TBPK tablet Take 24 mg on day 1, 20 mg on day 2, 16 mg on day 3, 12 mg on day 4, 8 mg on day 5, 4 mg on day 6. 01/30/21   Theadora Rama Scales, PA-C  ?promethazine-dextromethorphan (PROMETHAZINE-DM) 6.25-15 MG/5ML syrup Take 5 mLs by mouth 4 (four) times daily as needed for cough. 01/29/21   Theadora Rama Scales, PA-C  ?triamcinolone (KENALOG) 0.1 % Apply 1 application topically 2 (two) times daily. 04/11/20   Henson, Vickie L, PA-C  ? ? ?Family History ?History reviewed. No pertinent family history. ? ?Social History ?Social History  ? ?Tobacco Use  ? Smoking status: Never  ? Smokeless tobacco: Never  ?Substance Use Topics  ? Alcohol use: Yes  ? Drug use: Never  ? ? ? ?Allergies   ?Sumatriptan, Vancomycin, Latex, and Levofloxacin ? ? ?Review of Systems ?Review of Systems ?Per HPI ? ?Physical Exam ?Triage Vital Signs ?ED Triage Vitals [05/07/21 1027]  ?Enc Vitals Group  ?   BP 129/85  ?   Pulse Rate (!) 112  ?   Resp 18  ?   Temp (!) 100.4 ?F (38 ?C)  ?   Temp Source Oral  ?   SpO2  95 %  ?   Weight   ?   Height   ?   Head Circumference   ?   Peak Flow   ?   Pain Score 5  ?   Pain Loc   ?   Pain Edu?   ?   Excl. in GC?   ? ?No data found. ? ?Updated Vital Signs ?BP 129/85 (BP Location: Left Arm)   Pulse (!) 112   Temp (!) 100.4 ?F (38 ?C) (Oral)   Resp 18   SpO2 95%  ? ?Visual Acuity ?Right Eye Distance:   ?Left Eye Distance:   ?Bilateral Distance:   ? ?Right Eye Near:   ?Left Eye Near:    ?Bilateral Near:    ? ?Physical Exam ?Constitutional:   ?   General: She is not in acute distress. ?   Appearance: Normal appearance. She is not toxic-appearing or diaphoretic.  ?HENT:  ?   Head: Normocephalic and atraumatic.  ?   Right Ear: Tympanic membrane and ear canal normal.  ?   Left Ear: Tympanic membrane and ear canal normal.  ?   Nose:  Congestion present.  ?   Mouth/Throat:  ?   Mouth: Mucous membranes are moist.  ?   Pharynx: No posterior oropharyngeal erythema.  ?Eyes:  ?   Extraocular Movements: Extraocular movements intact.  ?   Conjunctiva/sclera: Conjunctivae normal.  ?   Pupils: Pupils are equal, round, and reactive to light.  ?Cardiovascular:  ?   Rate and Rhythm: Normal rate and regular rhythm.  ?   Pulses: Normal pulses.  ?   Heart sounds: Normal heart sounds.  ?Pulmonary:  ?   Effort: Pulmonary effort is normal. No respiratory distress.  ?   Breath sounds: Normal breath sounds. No stridor. No wheezing, rhonchi or rales.  ?Abdominal:  ?   General: Abdomen is flat. Bowel sounds are normal.  ?   Palpations: Abdomen is soft.  ?Musculoskeletal:     ?   General: Normal range of motion.  ?   Cervical back: Normal range of motion.  ?Skin: ?   General: Skin is warm and dry.  ?Neurological:  ?   General: No focal deficit present.  ?   Mental Status: She is alert and oriented to person, place, and time. Mental status is at baseline.  ?Psychiatric:     ?   Mood and Affect: Mood normal.     ?   Behavior: Behavior normal.  ? ? ? ?UC Treatments / Results  ?Labs ?(all labs ordered are listed, but only abnormal results are displayed) ?Labs Reviewed  ?NOVEL CORONAVIRUS, NAA  ?POCT INFLUENZA A/B  ? ? ?EKG ? ? ?Radiology ?No results found. ? ?Procedures ?Procedures (including critical care time) ? ?Medications Ordered in UC ?Medications - No data to display ? ?Initial Impression / Assessment and Plan / UC Course  ?I have reviewed the triage vital signs and the nursing notes. ? ?Pertinent labs & imaging results that were available during my care of the patient were reviewed by me and considered in my medical decision making (see chart for details). ? ?  ? ?Patient presents with symptoms likely from a viral upper respiratory infection. Differential includes bacterial pneumonia, sinusitis, allergic rhinitis, COVID-19, flu. Do not suspect underlying  cardiopulmonary process. Symptoms seem unlikely related to ACS, CHF or COPD exacerbations, pneumonia, pneumothorax. Patient is nontoxic appearing and not in need of emergent medical intervention.  Rapid flu was negative.  COVID test  is pending. ? ?Recommended symptom control with over the counter medications: Daily oral anti-histamine, Oral decongestant or IN corticosteroid, saline irrigations, cepacol lozenges, Robitussin, Delsym, honey tea.  Patient sent prescriptions. ? ?Return if symptoms fail to improve in 1-2 weeks or you develop shortness of breath, chest pain, severe headache. Patient states understanding and is agreeable. ? ?Discharged with PCP followup.  ?Final Clinical Impressions(s) / UC Diagnoses  ? ?Final diagnoses:  ?Viral upper respiratory tract infection with cough  ? ? ? ?Discharge Instructions   ? ?  ?Your rapid flu was negative.  COVID test is pending.  We will call if it is positive.  It appears that you have a viral upper respiratory infection that should self resolve in the next few days.  You have been prescribed 2 medications to help alleviate symptoms. ? ? ? ?ED Prescriptions   ? ? Medication Sig Dispense Auth. Provider  ? benzonatate (TESSALON) 100 MG capsule Take 1 capsule (100 mg total) by mouth every 8 (eight) hours as needed for cough. 21 capsule Gustavus Bryant, [place]  ? fluticasone (FLONASE) 50 MCG/ACT nasal spray Place 1 spray into both nostrils daily for 3 days. 16 g Gustavus Bryant, [place]  ? ?  ? ?PDMP not reviewed this encounter. ?  ?Gustavus Bryant, [place] ?05/07/21 1058 ? ?

## 2021-05-07 NOTE — Discharge Instructions (Signed)
Your rapid flu was negative.  COVID test is pending.  We will call if it is positive.  It appears that you have a viral upper respiratory infection that should self resolve in the next few days.  You have been prescribed 2 medications to help alleviate symptoms. ?

## 2021-05-08 LAB — NOVEL CORONAVIRUS, NAA: SARS-CoV-2, NAA: NOT DETECTED

## 2021-05-10 ENCOUNTER — Ambulatory Visit
Admission: RE | Admit: 2021-05-10 | Discharge: 2021-05-10 | Disposition: A | Discharge status: Home / Self Care | Payer: 59 | Source: Ambulatory Visit

## 2021-05-10 ENCOUNTER — Other Ambulatory Visit: Payer: Self-pay

## 2021-05-10 VITALS — BP 114/80 | HR 75 | Temp 99.0°F | Resp 18

## 2021-05-10 DIAGNOSIS — H66002 Acute suppurative otitis media without spontaneous rupture of ear drum, left ear: Secondary | ICD-10-CM

## 2021-05-10 DIAGNOSIS — B349 Viral infection, unspecified: Secondary | ICD-10-CM

## 2021-05-10 DIAGNOSIS — J4521 Mild intermittent asthma with (acute) exacerbation: Secondary | ICD-10-CM | POA: Diagnosis not present

## 2021-05-10 DIAGNOSIS — R112 Nausea with vomiting, unspecified: Secondary | ICD-10-CM

## 2021-05-10 DIAGNOSIS — R058 Other specified cough: Secondary | ICD-10-CM

## 2021-05-10 HISTORY — DX: Depression, unspecified: F32.A

## 2021-05-10 HISTORY — DX: Vitamin D deficiency, unspecified: E55.9

## 2021-05-10 HISTORY — DX: Anxiety disorder, unspecified: F41.9

## 2021-05-10 MED ORDER — IBUPROFEN 400 MG PO TABS: 400.0000 mg | ORAL_TABLET | Freq: Three times a day (TID) | ORAL | 0 refills | Status: DC | PRN

## 2021-05-10 MED ORDER — CEFDINIR 300 MG PO CAPS: 300.0000 mg | ORAL_CAPSULE | Freq: Two times a day (BID) | ORAL | 0 refills | Status: AC

## 2021-05-10 MED ORDER — CETIRIZINE HCL 10 MG PO TABS: 10.0000 mg | ORAL_TABLET | Freq: Every day | ORAL | 1 refills | Status: DC

## 2021-05-10 MED ORDER — GUAIFENESIN 400 MG PO TABS: ORAL_TABLET | ORAL | 0 refills | Status: DC

## 2021-05-10 MED ORDER — IPRATROPIUM BROMIDE 0.06 % NA SOLN: 2.0000 | Freq: Four times a day (QID) | NASAL | 2 refills | Status: DC

## 2021-05-10 MED ORDER — MOMETASONE FUROATE 50 MCG/ACT NA SUSP
2.0000 | Freq: Every day | NASAL | 5 refills | Status: DC
Start: 2021-05-10 — End: 2021-06-04

## 2021-05-10 MED ORDER — PROMETHAZINE-DM 6.25-15 MG/5ML PO SYRP: 5.0000 mL | ORAL_SOLUTION | Freq: Four times a day (QID) | ORAL | 0 refills | Status: DC | PRN

## 2021-05-10 MED ORDER — ALBUTEROL SULFATE HFA 108 (90 BASE) MCG/ACT IN AERS: 2.0000 | INHALATION_SPRAY | Freq: Four times a day (QID) | RESPIRATORY_TRACT | 2 refills | Status: DC | PRN

## 2021-05-10 MED ORDER — AEROCHAMBER PLUS FLO-VU LARGE MISC: 1.0000 | Freq: Once | 0 refills | Status: AC

## 2021-05-10 NOTE — ED Provider Notes (Signed)
[facility]    CSN: [id number] Arrival date & time: 05/10/21  1042    HISTORY   Chief Complaint  Patient presents with   Emesis    APPT 1100    Letter for School/Work   HPI Cynthia Frazier is a 31 y.o. female. Pt reports an episode of emesis this morning after a coughing fit.  Reports streaks of bright red blood in the vomit as well as when coughing.  Patient states she was seen at the [facility] urgent care on March 7 for a respiratory infection that began on March 4.  Patient was prescribed Flonase and Tessalon perles with little relief.  Patient states that the main reason she is here today is because she left work early and needs a note for her employer.  Patient states she also feels like she is also starting to develop more congestion in her chest and having pain in her right ear.  The history is provided by the patient.  Past Medical History:  Diagnosis Date   Anxiety    Depression    Vitamin D deficiency    Patient Active Problem List   Diagnosis Date Noted   Left upper quadrant abdominal pain 07/11/2020   Nausea and vomiting 07/11/2020   S/P cholecystectomy 07/11/2020   Past Surgical History:  Procedure Laterality Date   CESAREAN SECTION     CHOLECYSTECTOMY     OB History   No obstetric history on file.    Home Medications    Prior to Admission medications   Medication Sig Start Date End Date Taking? Authorizing Provider  benzonatate (TESSALON) 100 MG capsule Take 1 capsule (100 mg total) by mouth every 8 (eight) hours as needed for cough. 05/07/21   Gustavus BryantMound, Haley E, FNP  busPIRone (BUSPAR) 15 MG tablet Take 15 mg by mouth 3 (three) times daily. 10/17/20   [provider]  cetirizine (ZYRTEC) 10 MG tablet Take by mouth.    [provider]  clonazePAM (KLONOPIN) 0.5 MG tablet Take 0.5 mg by mouth daily as needed. 04/10/20   [provider]  doxepin (SINEQUAN) 25 MG capsule Take 25 mg by mouth at bedtime. 10/17/20   [provider]  ergocalciferol (VITAMIN D2) 1.25 MG (50000 UT) capsule  12/23/19   [provider]  fluticasone (FLONASE) 50 MCG/ACT nasal spray Place 1 spray into both nostrils daily for 3 days. 05/07/21 05/10/21  Gustavus BryantMound, Haley E, FNP  ibuprofen (ADVIL) 600 MG tablet  10/21/19   [provider]  loratadine (CLARITIN) 10 MG tablet Take by mouth.    [provider]  methylPREDNISolone (MEDROL DOSEPAK) 4 MG TBPK tablet Take 24 mg on day 1, 20 mg on day 2, 16 mg on day 3, 12 mg on day 4, 8 mg on day 5, 4 mg on day 6. 01/30/21   Theadora RamaMorgan, Emlyn Maves Scales, PA-C  promethazine-dextromethorphan (PROMETHAZINE-DM) 6.25-15 MG/5ML syrup Take 5 mLs by mouth 4 (four) times daily as needed for cough. 01/29/21   Theadora RamaMorgan, Baily Serpe Scales, PA-C  triamcinolone (KENALOG) 0.1 % Apply 1 application topically 2 (two) times daily. 04/11/20   Avanell ShackletonHenson, Vickie L, PA-C   Family History Family History  Adopted: Yes   Social History Social History   Tobacco Use   Smoking status: Never   Smokeless tobacco: Never  Vaping Use   Vaping Use: Never used  Substance Use Topics   Alcohol use: Yes    Comment: 2 times/week   Drug use: Never   Allergies  Sumatriptan, Vancomycin, Latex, and Levofloxacin  Review of Systems Review of Systems Pertinent findings noted in history of present illness.   Physical Exam Triage Vital Signs ED Triage Vitals  Enc Vitals Group     BP 12/28/20 0827 (!) 147/82     Pulse Rate 12/28/20 0827 72     Resp 12/28/20 0827 18     Temp 12/28/20 0827 98.3 F (36.8 C)     Temp Source 12/28/20 0827 Oral     SpO2 12/28/20 0827 98 %     Weight --      Height --      Head Circumference --      Peak Flow --      Pain Score 12/28/20 0826 5     Pain Loc --      Pain Edu? --      Excl. in GC? --   No data found.  Updated Vital Signs BP 114/80 (BP Location: Left Arm)    Pulse 75    Temp 99 F (37.2 C) (Oral)    Resp 18    SpO2 97%   Physical Exam Vitals and nursing note  reviewed.  Constitutional:      General: She is awake. She is not in acute distress.    Appearance: Normal appearance. She is ill-appearing.  HENT:     Head: Normocephalic and atraumatic.     Salivary Glands: Right salivary gland is not diffusely enlarged or tender. Left salivary gland is not diffusely enlarged or tender.     Right Ear: Hearing, ear canal and external ear normal. No drainage. No middle ear effusion. There is no impacted cerumen. Tympanic membrane is bulging (Clear fluid). Tympanic membrane is not injected or erythematous.     Left Ear: Hearing, ear canal and external ear normal. No drainage. A middle ear effusion (Suppurative) is present. There is no impacted cerumen. Tympanic membrane is bulging. Tympanic membrane is not injected or erythematous.     Nose: Mucosal edema and rhinorrhea present. No nasal deformity, septal deviation, signs of injury, nasal tenderness or congestion. Rhinorrhea is clear.     Right Nostril: Occlusion present. No foreign body, epistaxis or septal hematoma.     Left Nostril: Occlusion present. No foreign body, epistaxis or septal hematoma.     Right Turbinates: Enlarged, swollen and pale.     Left Turbinates: Enlarged, swollen and pale.     Right Sinus: No maxillary sinus tenderness or frontal sinus tenderness.     Left Sinus: No maxillary sinus tenderness or frontal sinus tenderness.     Mouth/Throat:     Lips: Pink. No lesions.     Mouth: Mucous membranes are moist. No oral lesions.     Pharynx: Oropharynx is clear. Uvula midline. Posterior oropharyngeal erythema present. No pharyngeal swelling, oropharyngeal exudate or uvula swelling.     Tonsils: No tonsillar exudate. 0 on the right. 0 on the left.     Comments: Postnasal drip Eyes:     General: Lids are normal.        Right eye: No discharge.        Left eye: No discharge.     Extraocular Movements: Extraocular movements intact.     Conjunctiva/sclera: Conjunctivae normal.     Right eye:  Right conjunctiva is not injected.     Left eye: Left conjunctiva is not injected.  Neck:     Trachea: Trachea and phonation normal.  Cardiovascular:     Rate and Rhythm: Normal  rate and regular rhythm.     Pulses: Normal pulses.     Heart sounds: Normal heart sounds. No murmur heard.   No friction rub. No gallop.  Pulmonary:     Effort: Pulmonary effort is normal. No accessory muscle usage, prolonged expiration or respiratory distress.     Breath sounds: No stridor, decreased air movement or transmitted upper airway sounds. Examination of the right-middle field reveals decreased breath sounds. Examination of the left-middle field reveals decreased breath sounds. Examination of the right-lower field reveals decreased breath sounds. Examination of the left-lower field reveals decreased breath sounds. Decreased breath sounds present. No wheezing, rhonchi or rales.  Chest:     Chest wall: No tenderness.  Musculoskeletal:        General: Normal range of motion.     Cervical back: Normal range of motion and neck supple. Normal range of motion.  Lymphadenopathy:     Cervical: Cervical adenopathy present.     Right cervical: Superficial cervical adenopathy and posterior cervical adenopathy present.     Left cervical: Superficial cervical adenopathy and posterior cervical adenopathy present.  Skin:    General: Skin is warm and dry.     Findings: No erythema or rash.  Neurological:     General: No focal deficit present.     Mental Status: She is alert and oriented to person, place, and time.  Psychiatric:        Mood and Affect: Mood normal.        Behavior: Behavior normal. Behavior is cooperative.    Visual Acuity Right Eye Distance:   Left Eye Distance:   Bilateral Distance:    Right Eye Near:   Left Eye Near:    Bilateral Near:     UC Couse / Diagnostics / Procedures:    EKG  Radiology No results found.  Procedures Procedures (including critical care time)  UC Diagnoses  / Final Clinical Impressions(s)   I have reviewed the triage vital signs and the nursing notes.  Pertinent labs & imaging results that were available during my care of the patient were reviewed by me and considered in my medical decision making (see chart for details).   Final diagnoses:  Viral illness  Nausea and vomiting, unspecified vomiting type  Productive cough  Acute suppurative otitis media of left ear  Mild intermittent exacerbation of reactive airway disease   At end of visit, patient reports a history of having been prescribed albuterol in the past but states she is never been diagnosed with asthma.  Patient has significantly decreased breath sounds on exam today, I believe she would benefit from albuterol and a steroid Dosepak.  Patient also provided with a prescription for Promethazine DM for dual action of cough suppression as well as nausea treatment.  Patient advised that I recommend she begin cefdinir for acute left otitis media (despite having pain in her right ear on arrival), which should also keep her lungs from developing any bacterial superinfection.  I believe that overall, patient has some underlying allergies that make her more susceptible to a viral infection that has since evolved into bacterial infection in her ears and possibly in her lungs.  Allergy medications prescribed as well, patient advised to continue allergy medications throughout the spring season.  Viral testing performed at prior urgent care visit was negative, do not believe there is indication to repeat this today.  Note provided for work.  Return precautions advised. ED Prescriptions     Medication Sig Dispense Auth.  Provider   promethazine-dextromethorphan (PROMETHAZINE-DM) 6.25-15 MG/5ML syrup Take 5 mLs by mouth 4 (four) times daily as needed for cough. 180 mL Theadora Rama Scales, PA-C   guaifenesin (HUMIBID E) 400 MG TABS tablet Take 1 tablet 3 times daily as needed for chest congestion and cough 21  tablet Theadora Rama Scales, PA-C   albuterol (VENTOLIN HFA) 108 (90 Base) MCG/ACT inhaler Inhale 2 puffs into the lungs every 6 (six) hours as needed for wheezing or shortness of breath (Cough). 18 g Theadora Rama Scales, PA-C   Spacer/Aero-Holding Chambers (AEROCHAMBER PLUS FLO-VU LARGE) MISC 1 each by Other route once for 1 dose. 1 each Theadora Rama Scales, PA-C   ipratropium (ATROVENT) 0.06 % nasal spray Place 2 sprays into both nostrils 4 (four) times daily. As needed for nasal congestion, runny nose 15 mL Theadora Rama Scales, PA-C   ibuprofen (ADVIL) 400 MG tablet Take 1 tablet (400 mg total) by mouth every 8 (eight) hours as needed for up to 30 doses. 30 tablet Theadora Rama Scales, PA-C   cetirizine (ZYRTEC ALLERGY) 10 MG tablet Take 1 tablet (10 mg total) by mouth at bedtime. 90 tablet Theadora Rama Scales, PA-C   mometasone (NASONEX) 50 MCG/ACT nasal spray Place 2 sprays into the nose daily. 1 each Theadora Rama Scales, PA-C   cefdinir (OMNICEF) 300 MG capsule Take 1 capsule (300 mg total) by mouth 2 (two) times daily for 10 days. 20 capsule Theadora Rama Scales, PA-C      PDMP not reviewed this encounter.  Pending results:  Labs Reviewed - No data to display  Medications Ordered in UC: Medications - No data to display  Disposition Upon Discharge:  Condition: stable for discharge home Home: take medications as prescribed; routine discharge instructions as discussed; follow up as advised.  Patient presented with an acute illness with associated systemic symptoms and significant discomfort requiring urgent management. In my opinion, this is a condition that a prudent lay person (someone who possesses an average knowledge of health and medicine) may potentially expect to result in complications if not addressed urgently such as respiratory distress, impairment of bodily function or dysfunction of bodily organs.   Routine symptom specific, illness specific and/or  disease specific instructions were discussed with the patient and/or caregiver at length.   As such, the patient has been evaluated and assessed, work-up was performed and treatment was provided in alignment with urgent care protocols and evidence based medicine.  Patient/parent/caregiver has been advised that the patient may require follow up for further testing and treatment if the symptoms continue in spite of treatment, as clinically indicated and appropriate.  If the patient was tested for COVID-19, Influenza and/or RSV, then the patient/parent/guardian was advised to isolate at home pending the results of his/her diagnostic coronavirus test and potentially longer if theyre positive. I have also advised pt that if his/her COVID-19 test returns positive, it's recommended to self-isolate for at least 10 days after symptoms first appeared AND until fever-free for 24 hours without fever reducer AND other symptoms have improved or resolved. Discussed self-isolation recommendations as well as instructions for household member/close contacts as per the [facility] and [place] DHHS, and also gave patient the COVID packet with this information.  Patient/parent/caregiver has been advised to return to the [facility] or PCP in 3-5 days if no better; to PCP or the Emergency Department if new signs and symptoms develop, or if the current signs or symptoms continue to change or worsen for further workup, evaluation and treatment  as clinically indicated and appropriate  The patient will follow up with their current PCP if and as advised. If the patient does not currently have a PCP we will assist them in obtaining one.   The patient may need specialty follow up if the symptoms continue, in spite of conservative treatment and management, for further workup, evaluation, consultation and treatment as clinically indicated and appropriate.  Patient/parent/caregiver verbalized understanding and agreement of plan as discussed.  All  questions were addressed during visit.  Please see discharge instructions below for further details of plan.  Discharge Instructions:   Discharge Instructions      Your symptoms and physical exam findings are concerning for a viral respiratory infection that has evolved and cause you to have exacerbation of mild reactive airway disease and a bacterial infection behind your left eardrum.  I also believe that you may be suffering from seasonal allergies that are hitting a little harder this year than they have in seasons past.   Please see the list below for recommended medications, dosages and frequencies to provide relief of your current symptoms:    Cefdinir (Omnicef):  1 capsule twice daily for 10 days to resolve the bacterial infection in your left ear.  You can take it with or without food.  This antibiotic can cause upset stomach, this will resolve once antibiotics are complete.  You are welcome to use a probiotic, eat yogurt, take Imodium while taking this medication.  Please avoid other systemic medications such as Maalox, Pepto-Bismol or milk of magnesia as they can interfere with your body's ability to absorb the antibiotics.  If you are currently taking amoxicillin, please discontinue this at this time.   Albuterol HFA: This is a bronchodilator, it relaxes the smooth muscles that constrict your airway in your lungs when you are feeling sick or having inflammation secondary to allergies or upper respiratory infection.  Please inhale 2 puffs twice daily every day using the spacer provided.  You can also inhale 2 more puffs as often as needed throughout the day for aggravating cough, chest tightness, feeling short of breath, wheezing.      Ibuprofen  (Advil, Motrin): This is a good anti-inflammatory medication which addresses aches, pains and inflammation of the upper airways that causes sinus and nasal congestion as well as in the lower airways which makes your cough feel tight and sometimes  burn.  I recommend that you take between 400 to 600 mg every 6-8 hours as needed, I have provided you with a prescription for 400 mg.      Mometasone (Nasonex): This is a steroid nasal spray that you use once daily, 1 spray in each nare.  After 3 to 5 days of use, you will have significant improvement of the inflammation and mucus production that is being caused by exposure to allergens.  This medication can be purchased over-the-counter however I have provided you with a prescription.      Cetirizine (Zyrtec): This is an excellent second-generation antihistamine that helps to reduce respiratory inflammatory response to viruses and environmental allergens.  Please take 1 tablet daily at bedtime.   Ipratropium (Atrovent): This is an excellent nasal decongestant spray that does not cause rebound congestion, can be used up to 4 times daily as needed, instill 2 sprays into each nare with each use.  I have provided you with a prescription for this medication.      Guaifenesin (Robitussin, Mucinex): This is an expectorant.  This helps break up chest congestion  and loosen up thick nasal drainage making phlegm and drainage more liquid and therefore easier to remove.  I recommend being 400 mg three times daily as needed.      Promethazine DM: Promethazine is both a nasal decongestant and an antinausea medication that makes most patients feel fairly sleepy.  The DM is dextromethorphan, a cough suppressant found in many over-the-counter cough medications.  Please take 5 mL before bedtime to minimize your cough which will help you sleep better.  I have provided you with a prescription for this medication.      Conservative care is also recommended at this time.  This includes rest, pushing clear fluids and activity as tolerated.  Warm beverages such as teas and broths versus cold beverages/popsicles and frozen sherbet/sorbet are your choice, both warm and cold are beneficial.  You may also notice that your appetite  is reduced; this is okay as long as you are drinking plenty of clear fluids.    Please follow-up within the next 5-7 days either with your primary care provider or urgent care if your symptoms do not resolve.  If you do not have a primary care provider, we will assist you in finding one.   Thank you for visiting urgent care today.  We appreciate the opportunity to participate in your care.   For your cough, please begin Mucinex 1 tablet 3 times daily as needed for to loosen chest congestion and make your coughing effort much easier.  For cough suppression and to help you sleep at night, please begin Promethazine DM, 5 mL.  Promethazine is also used to help with nausea so do keep in mind that you can take this medication up to 4 times daily as needed for both cough and nausea.  Please discontinue Zofran and Tessalon Perles at this time as they are contraindicated when taking Promethazine DM.  I provided you with a note to be out of work as you requested.  Sincerely hope you feel better soon.  Thank you for visiting urgent care today.      This office note has been dictated using Teaching laboratory technician.  Unfortunately, and despite my best efforts, this method of dictation can sometimes lead to occasional typographical or grammatical errors.  I apologize in advance if this occurs.     Theadora Rama Scales, PA-C 05/10/21 1404

## 2021-05-10 NOTE — ED Triage Notes (Signed)
Pt reports an episode of emesis this morning after a coughing fit.  Reports streaks of bright red blood in the vomit as well as when coughing.  Has has resp illness - coughing etc since 03/04.  Was seen at [facility] on 03/07.  Left work today and needs work note.   ? ?Has Zofran and Tessalon at home.  Feels like congestion is more in chest now.   ?

## 2021-05-10 NOTE — Discharge Instructions (Addendum)
Your symptoms and physical exam findings are concerning for a viral respiratory infection that has evolved and cause you to have exacerbation of mild reactive airway disease and a bacterial infection behind your left eardrum.  I also believe that you may be suffering from seasonal allergies that are hitting a little harder this year than they have in seasons past. ? ? ?Please see the list below for recommended medications, dosages and frequencies to provide relief of your current symptoms:   ? ?Cefdinir (Omnicef):  1 capsule twice daily for 10 days to resolve the bacterial infection in your left ear.  You can take it with or without food.  This antibiotic can cause upset stomach, this will resolve once antibiotics are complete.  You are welcome to use a probiotic, eat yogurt, take Imodium while taking this medication.  Please avoid other systemic medications such as Maalox, Pepto-Bismol or milk of magnesia as they can interfere with your body's ability to absorb the antibiotics.  If you are currently taking amoxicillin, please discontinue this at this time. ?  ?Albuterol HFA: This is a bronchodilator, it relaxes the smooth muscles that constrict your airway in your lungs when you are feeling sick or having inflammation secondary to allergies or upper respiratory infection.  Please inhale 2 puffs twice daily every day using the spacer provided.  You can also inhale 2 more puffs as often as needed throughout the day for aggravating cough, chest tightness, feeling short of breath, wheezing.    ?  ?Ibuprofen  (Advil, Motrin): This is a good anti-inflammatory medication which addresses aches, pains and inflammation of the upper airways that causes sinus and nasal congestion as well as in the lower airways which makes your cough feel tight and sometimes burn.  I recommend that you take between 400 to 600 mg every 6-8 hours as needed, I have provided you with a prescription for 400 mg.    ?  ?Mometasone (Nasonex): This is  a steroid nasal spray that you use once daily, 1 spray in each nare.  After 3 to 5 days of use, you will have significant improvement of the inflammation and mucus production that is being caused by exposure to allergens.  This medication can be purchased over-the-counter however I have provided you with a prescription.    ?  ?Cetirizine (Zyrtec): This is an excellent second-generation antihistamine that helps to reduce respiratory inflammatory response to viruses and environmental allergens.  Please take 1 tablet daily at bedtime. ?  ?Ipratropium (Atrovent): This is an excellent nasal decongestant spray that does not cause rebound congestion, can be used up to 4 times daily as needed, instill 2 sprays into each nare with each use.  I have provided you with a prescription for this medication.    ?  ?Guaifenesin (Robitussin, Mucinex): This is an expectorant.  This helps break up chest congestion and loosen up thick nasal drainage making phlegm and drainage more liquid and therefore easier to remove.  I recommend being 400 mg three times daily as needed.    ?  ?Promethazine DM: Promethazine is both a nasal decongestant and an antinausea medication that makes most patients feel fairly sleepy.  The DM is dextromethorphan, a cough suppressant found in many over-the-counter cough medications.  Please take 5 mL before bedtime to minimize your cough which will help you sleep better.  I have provided you with a prescription for this medication.    ?  ?Conservative care is also recommended at this time.  This includes rest, pushing clear fluids and activity as tolerated.  Warm beverages such as teas and broths versus cold beverages/popsicles and frozen sherbet/sorbet are your choice, both warm and cold are beneficial.  You may also notice that your appetite is reduced; this is okay as long as you are drinking plenty of clear fluids.  ?  ?Please follow-up within the next 5-7 days either with your primary care provider or  urgent care if your symptoms do not resolve.  If you do not have a primary care provider, we will assist you in finding one. ?  ?Thank you for visiting urgent care today.  We appreciate the opportunity to participate in your care. ? ? ?For your cough, please begin Mucinex 1 tablet 3 times daily as needed for to loosen chest congestion and make your coughing effort much easier. ? ?For cough suppression and to help you sleep at night, please begin Promethazine DM, 5 mL.  Promethazine is also used to help with nausea so do keep in mind that you can take this medication up to 4 times daily as needed for both cough and nausea.  Please discontinue Zofran and Tessalon Perles at this time as they are contraindicated when taking Promethazine DM. ? ?I provided you with a note to be out of work as you requested.  Sincerely hope you feel better soon. ? ?Thank you for visiting urgent care today. ?

## 2021-05-13 ENCOUNTER — Ambulatory Visit (INDEPENDENT_AMBULATORY_CARE_PROVIDER_SITE_OTHER): Payer: 59 | Admitting: Medical

## 2021-05-13 ENCOUNTER — Other Ambulatory Visit: Payer: Self-pay

## 2021-05-13 VITALS — BP 120/80 | HR 59 | Temp 98.5°F | Wt 257.8 lb

## 2021-05-13 DIAGNOSIS — J0110 Acute frontal sinusitis, unspecified: Secondary | ICD-10-CM

## 2021-05-13 DIAGNOSIS — R051 Acute cough: Secondary | ICD-10-CM

## 2021-05-13 NOTE — Progress Notes (Signed)
Subjective: ? Cynthia Frazier is a 31 y.o. female who presents for ?Chief Complaint  ?Patient presents with  ? trouble swallowing  ?  Trouble swaollowing, swollen lymph nodes been going on for the last 5 days. On antibiotic for possible strep as daughter had strep when her systems started  ?   ?Here for follow-up on illness.  She saw urgent care about a week ago for respiratory symptoms including fever, cough, ear pressure, vomiting, face swelling, sinus pressure, congestion, had some yellow-tinged and bloody mucus from the nose, and felt lymph nodes swollen.  Initially had flu and COVID test that were negative.  She also checks COVID test regular since she works in [employer] and has had consistent negative COVID test ? ?Her face still feels swollen, she has a hard time swallowing although she does not have severe sore throat.  Still has a lot of congestion and sinus pressure ? ?She was concerned because she went back to the urgent care later in the week and they added on several medications.  They have been out of the steroid which she has not picked up from the pharmacy yet ? ?Her son had a strep exposure stay added on amoxicillin, added cough syrup, 2 different nasal sprays, cough medicine otherwise. ? ?Currently is on amoxicillin and cefdinir ? ?No other aggravating or relieving factors.   ? ?No other c/o. ? ?Past Medical History:  ?Diagnosis Date  ? Anxiety   ? Depression   ? Vitamin D deficiency   ? ?Current Outpatient Medications on File Prior to Visit  ?Medication Sig Dispense Refill  ? albuterol (VENTOLIN HFA) 108 (90 Base) MCG/ACT inhaler Inhale 2 puffs into the lungs every 6 (six) hours as needed for wheezing or shortness of breath (Cough). 18 g 2  ? busPIRone (BUSPAR) 15 MG tablet Take 15 mg by mouth 3 (three) times daily.    ? cefdinir (OMNICEF) 300 MG capsule Take 1 capsule (300 mg total) by mouth 2 (two) times daily for 10 days. 20 capsule 0  ? cetirizine (ZYRTEC ALLERGY) 10 MG tablet Take 1 tablet  (10 mg total) by mouth at bedtime. 90 tablet 1  ? clonazePAM (KLONOPIN) 0.5 MG tablet Take 0.5 mg by mouth daily as needed.    ? doxepin (SINEQUAN) 25 MG capsule Take 25 mg by mouth at bedtime.    ? ergocalciferol (VITAMIN D2) 1.25 MG (50000 UT) capsule     ? guaifenesin (HUMIBID E) 400 MG TABS tablet Take 1 tablet 3 times daily as needed for chest congestion and cough 21 tablet 0  ? ibuprofen (ADVIL) 400 MG tablet Take 1 tablet (400 mg total) by mouth every 8 (eight) hours as needed for up to 30 doses. 30 tablet 0  ? ipratropium (ATROVENT) 0.06 % nasal spray Place 2 sprays into both nostrils 4 (four) times daily. As needed for nasal congestion, runny nose 15 mL 2  ? mometasone (NASONEX) 50 MCG/ACT nasal spray Place 2 sprays into the nose daily. 1 each 5  ? promethazine-dextromethorphan (PROMETHAZINE-DM) 6.25-15 MG/5ML syrup Take 5 mLs by mouth 4 (four) times daily as needed for cough. 180 mL 0  ? ?No current facility-administered medications on file prior to visit.  ? ? ? ?The following portions of the patient's history were reviewed and updated as appropriate: allergies, current medications, past family history, past medical history, past social history, past surgical history and problem list. ? ?ROS ?Otherwise as in subjective above ? ?Objective: ?BP 120/80   Pulse Marland Kitchen)  59   Temp 98.5 ?F (36.9 ?C)   Wt 257 lb 12.8 oz (116.9 kg)   SpO2 99%   BMI 47.15 kg/m?  ? ?General appearance: alert, no distress, well developed, well nourished ?HEENT: normocephalic, sclerae anicteric, conjunctiva pink and moist, TMs flat, nares with turbinate edema and mucoid discharge in both nostrils, mild erythema ?Oral cavity: MMM, no lesions ?Neck: supple, no lymphadenopathy, no thyromegaly, no masses ?Heart: RRR, normal S1, S2, no murmurs ?Lungs: CTA bilaterally, no wheezes, rhonchi, or rales ?Abdomen: +bs, soft, non tender, non distended, no masses, no hepatomegaly, no splenomegaly ?Pulses: 2+ radial pulses, 2+ pedal pulses, normal  cap refill ?Ext: no edema ? ? ?Assessment: ?Encounter Diagnoses  ?Name Primary?  ? Acute cough Yes  ? Acute non-recurrent frontal sinusitis   ? ? ? ?Plan: ?We discussed her symptoms and concerns.  Advise she stop Omnicef and just finish out the amoxicillin.  Likewise just use warm nasal sprays not both.  She will use Flonase.  Continue guaifenesin to help get up mucus.  Rest, hydrate well, can use salt water gargles and add nasal saline flush.  CBC today given her concern for lymph node swollen. ? ?Her ears look fine today so what ever ear infection she had is improving.  Reassured.  Follow-up pending CBC ? ?Cynthia Frazier was seen today for trouble swallowing. ? ?Diagnoses and all orders for this visit: ? ?Acute cough ?-     CBC with Differential/Platelet ? ?Acute non-recurrent frontal sinusitis ? ? ? ?Follow up: pending labs ? ? ? ? ?

## 2021-05-13 NOTE — Patient Instructions (Addendum)
Recommendations: ?Hydrate well with water throughout the day ?Consider salt water gargles for throat irritation ?Continue the Guaifenesin/Humibid for mucous another 3-5 days ?Consider nasal saline flush daily ?Consider using 1 of the nasal sprays, either Nasonex or Atroven, but maybe not both ?Use 1 of the antibiotics, Amoxicillin or Omnicef, not necessarily both ?Continue Promethazine DM for cough as needed ?If not much improved within 3-5 days, the call back ?

## 2021-05-14 LAB — CBC WITH DIFFERENTIAL/PLATELET
Basophils Absolute: 0.1 10*3/uL (ref 0.0–0.2)
Basos: 1 %
EOS (ABSOLUTE): 0.4 10*3/uL (ref 0.0–0.4)
Eos: 3 %
Hematocrit: 39.9 % (ref 34.0–46.6)
Hemoglobin: 13.7 g/dL (ref 11.1–15.9)
Immature Grans (Abs): 0.1 10*3/uL (ref 0.0–0.1)
Immature Granulocytes: 1 %
Lymphocytes Absolute: 2.5 10*3/uL (ref 0.7–3.1)
Lymphs: 22 %
MCH: 30.9 pg (ref 26.6–33.0)
MCHC: 34.3 g/dL (ref 31.5–35.7)
MCV: 90 fL (ref 79–97)
Monocytes Absolute: 0.7 10*3/uL (ref 0.1–0.9)
Monocytes: 7 %
Neutrophils Absolute: 7.6 10*3/uL — ABNORMAL HIGH (ref 1.4–7.0)
Neutrophils: 66 %
Platelets: 325 10*3/uL (ref 150–450)
RBC: 4.43 x10E6/uL (ref 3.77–5.28)
RDW: 11.8 % (ref 11.7–15.4)
WBC: 11.3 10*3/uL — ABNORMAL HIGH (ref 3.4–10.8)

## 2021-05-15 ENCOUNTER — Ambulatory Visit: Payer: Self-pay | Admitting: Medical

## 2021-05-18 ENCOUNTER — Ambulatory Visit: Payer: 59

## 2021-05-18 ENCOUNTER — Ambulatory Visit (HOSPITAL_COMMUNITY): Payer: 59

## 2021-05-19 ENCOUNTER — Ambulatory Visit (HOSPITAL_COMMUNITY): Payer: 59

## 2021-05-20 ENCOUNTER — Ambulatory Visit (HOSPITAL_COMMUNITY)
Admission: EM | Admit: 2021-05-20 | Discharge: 2021-05-20 | Disposition: A | Discharge status: Home / Self Care | Payer: 59 | Attending: Nurse Practitioner | Admitting: Nurse Practitioner

## 2021-05-20 ENCOUNTER — Other Ambulatory Visit: Payer: Self-pay

## 2021-05-20 DIAGNOSIS — R6889 Other general symptoms and signs: Secondary | ICD-10-CM | POA: Diagnosis not present

## 2021-05-20 DIAGNOSIS — J029 Acute pharyngitis, unspecified: Secondary | ICD-10-CM | POA: Diagnosis not present

## 2021-05-20 LAB — POCT RAPID STREP A, ED / UC: Streptococcus, Group A Screen (Direct): NEGATIVE

## 2021-05-20 MED ORDER — PENICILLIN G BENZATHINE 1200000 UNIT/2ML IM SUSY
PREFILLED_SYRINGE | INTRAMUSCULAR | Status: AC
  Filled 2021-05-20: qty 2

## 2021-05-20 MED ORDER — PENICILLIN G BENZATHINE 1200000 UNIT/2ML IM SUSY
1.2000 10*6.[IU] | PREFILLED_SYRINGE | Freq: Once | INTRAMUSCULAR | Status: AC
  Administered 2021-05-20: 1.2 10*6.[IU] via INTRAMUSCULAR

## 2021-05-20 NOTE — ED Notes (Signed)
Labeled throat swab and placed in lab room ?

## 2021-05-20 NOTE — Discharge Instructions (Signed)
-   We are giving you a shot of penicillin today for probable strep throat ?- This should take care of your symptoms ?- Please follow up with your primary care provider if your symptoms persist despite treatment. ?

## 2021-05-20 NOTE — ED Provider Notes (Signed)
?[facility] ? ? ? ?CSN: [id number] ?Arrival date & time: 05/20/21  1845 ? ? ?  ? ?History   ?Chief Complaint ?Chief Complaint  ?Patient presents with  ? Sore Throat  ?  Entered by patient  ? ? ?HPI ?Cynthia Frazier is a 31 y.o. female.  ? ?Patient reports a 16-day history of cough, congestion, sore throat, headache.  She denies abdominal pain, nausea, vomiting.  Her daughter did have strep throat when her symptoms initially started.  The patient was treated with cefdinir for an ear infection and was told this would also cover for strep throat.  Patient reports the throat pain is severe, she is drooling, and has had body aches.  She has also had difficulty and painful swallowing. ? ?Patient reports history of tonsil and adenoidectomy when she was a child. ? ? ?Past Medical History:  ?Diagnosis Date  ? Anxiety   ? Depression   ? Vitamin D deficiency   ? ? ?Patient Active Problem List  ? Diagnosis Date Noted  ? Left upper quadrant abdominal pain 07/11/2020  ? Nausea and vomiting 07/11/2020  ? S/P cholecystectomy 07/11/2020  ? ? ?Past Surgical History:  ?Procedure Laterality Date  ? CESAREAN SECTION    ? CHOLECYSTECTOMY    ? ? ?OB History   ?No obstetric history on file. ?  ? ? ? ?Home Medications   ? ?Prior to Admission medications   ?Medication Sig Start Date End Date Taking? Authorizing Provider  ?albuterol (VENTOLIN HFA) 108 (90 Base) MCG/ACT inhaler Inhale 2 puffs into the lungs every 6 (six) hours as needed for wheezing or shortness of breath (Cough). 05/10/21   Theadora Rama Scales, PA-C  ?busPIRone (BUSPAR) 15 MG tablet Take 15 mg by mouth 3 (three) times daily. 10/17/20   [provider]  ?cefdinir (OMNICEF) 300 MG capsule Take 1 capsule (300 mg total) by mouth 2 (two) times daily for 10 days. 05/10/21 05/20/21  Theadora Rama Scales, PA-C  ?cetirizine (ZYRTEC ALLERGY) 10 MG tablet Take 1 tablet (10 mg total) by mouth at bedtime. 05/10/21 11/06/21  Theadora Rama Scales, PA-C  ?clonazePAM  (KLONOPIN) 0.5 MG tablet Take 0.5 mg by mouth daily as needed. 04/10/20   [provider]  ?doxepin (SINEQUAN) 25 MG capsule Take 25 mg by mouth at bedtime. 10/17/20   [provider]  ?ergocalciferol (VITAMIN D2) 1.25 MG (50000 UT) capsule  12/23/19   [provider]  ?guaifenesin (HUMIBID E) 400 MG TABS tablet Take 1 tablet 3 times daily as needed for chest congestion and cough 05/10/21   Theadora Rama Scales, PA-C  ?ibuprofen (ADVIL) 400 MG tablet Take 1 tablet (400 mg total) by mouth every 8 (eight) hours as needed for up to 30 doses. 05/10/21   Theadora Rama Scales, PA-C  ?ipratropium (ATROVENT) 0.06 % nasal spray Place 2 sprays into both nostrils 4 (four) times daily. As needed for nasal congestion, runny nose 05/10/21   Theadora Rama Scales, PA-C  ?mometasone (NASONEX) 50 MCG/ACT nasal spray Place 2 sprays into the nose daily. 05/10/21 08/08/21  Theadora Rama Scales, PA-C  ?promethazine-dextromethorphan (PROMETHAZINE-DM) 6.25-15 MG/5ML syrup Take 5 mLs by mouth 4 (four) times daily as needed for cough. 05/10/21   Theadora Rama Scales, PA-C  ? ? ?Family History ?Family History  ?Adopted: Yes  ? ? ?Social History ?Social History  ? ?Tobacco Use  ? Smoking status: Never  ? Smokeless tobacco: Never  ?Vaping Use  ? Vaping Use: Never used  ?Substance Use Topics  ?  Alcohol use: Yes  ?  Comment: 2 times/week  ? Drug use: Never  ? ? ? ?Allergies   ?Sumatriptan, Vancomycin, Latex, and Levofloxacin ? ? ?Review of Systems ?Review of Systems ?Per HPI ? ?Physical Exam ?Triage Vital Signs ?ED Triage Vitals [05/20/21 1930]  ?Enc Vitals Group  ?   BP (!) 163/108  ?   Pulse Rate 82  ?   Resp 16  ?   Temp (!) 97.2 ?F (36.2 ?C)  ?   Temp Source Oral  ?   SpO2 100 %  ?   Weight   ?   Height   ?   Head Circumference   ?   Peak Flow   ?   Pain Score   ?   Pain Loc   ?   Pain Edu?   ?   Excl. in GC?   ? ?No data found. ? ?Updated Vital Signs ?BP (!) 163/108 (BP Location: Left Arm)   Pulse 82   Temp (!)  97.2 ?F (36.2 ?C) (Oral)   Resp 16   SpO2 100%  ? ?Visual Acuity ?Right Eye Distance:   ?Left Eye Distance:   ?Bilateral Distance:   ? ?Right Eye Near:   ?Left Eye Near:    ?Bilateral Near:    ? ?Physical Exam ?Vitals and nursing note reviewed.  ?Constitutional:   ?   General: She is not in acute distress. ?   Appearance: She is well-developed. She is not toxic-appearing.  ?HENT:  ?   Right Ear: Tympanic membrane and ear canal normal. No drainage, swelling or tenderness. No middle ear effusion. Tympanic membrane is not erythematous.  ?   Left Ear: Tympanic membrane and ear canal normal. No drainage, swelling or tenderness.  No middle ear effusion. Tympanic membrane is not erythematous.  ?   Mouth/Throat:  ?   Mouth: Mucous membranes are moist.  ?   Pharynx: Uvula midline. Pharyngeal swelling and uvula swelling present.  ?   Tonsils: No tonsillar exudate or tonsillar abscesses. 0 on the right. 0 on the left.  ?Cardiovascular:  ?   Rate and Rhythm: Normal rate and regular rhythm.  ?Pulmonary:  ?   Effort: Pulmonary effort is normal. No respiratory distress.  ?   Breath sounds: Normal breath sounds. No wheezing, rhonchi or rales.  ?Skin: ?   Findings: Rash present. Rash is macular and papular.  ?   Comments: Patient has sandpaper-like maculopapular rash to forehead, upper back  ?Neurological:  ?   Mental Status: She is alert.  ? ? ? ?UC Treatments / Results  ?Labs ?(all labs ordered are listed, but only abnormal results are displayed) ?Labs Reviewed  ?POCT RAPID STREP A, ED / UC  ? ? ?EKG ? ? ?Radiology ?No results found. ? ?Procedures ?Procedures (including critical care time) ? ?Medications Ordered in UC ?Medications  ?penicillin g benzathine (BICILLIN LA) 1200000 UNIT/2ML injection 1.2 Million Units (has no administration in time range)  ? ? ?Initial Impression / Assessment and Plan / UC Course  ?I have reviewed the triage vital signs and the nursing notes. ? ?Pertinent labs & imaging results that were available  during my care of the patient were reviewed by me and considered in my medical decision making (see chart for details). ? ?  ?History and examination consistent with Streptococcus pharyngitis and scarlet fever given rash.  Patient prefers treatment with IM penicillin today as she has been having difficulty swallowing.  We will also obtain rapid  strep test. ?Final Clinical Impressions(s) / UC Diagnoses  ? ?Final diagnoses:  ?Acute pharyngitis, unspecified etiology  ?Suspected scarlet fever  ? ? ? ?Discharge Instructions   ? ?  ?- We are giving you a shot of penicillin today for probable strep throat ?- This should take care of your symptoms ?- Please follow up with your primary care provider if your symptoms persist despite treatment. ? ? ? ? ?ED Prescriptions   ?None ?  ? ?PDMP not reviewed this encounter. ?  ?Valentino NoseMartinez, Tineshia Becraft A, NP ?05/20/21 2031 ? ?

## 2021-05-21 ENCOUNTER — Other Ambulatory Visit: Payer: Self-pay

## 2021-05-21 ENCOUNTER — Emergency Department (HOSPITAL_COMMUNITY)
Admission: EM | Admit: 2021-05-21 | Discharge: 2021-05-21 | Disposition: A | Discharge status: Home / Self Care | Payer: 59 | Attending: Emergency Medicine | Admitting: Emergency Medicine

## 2021-05-21 ENCOUNTER — Emergency Department (HOSPITAL_COMMUNITY): Payer: 59

## 2021-05-21 DIAGNOSIS — R070 Pain in throat: Secondary | ICD-10-CM | POA: Diagnosis not present

## 2021-05-21 DIAGNOSIS — Z91040 Latex allergy status: Secondary | ICD-10-CM | POA: Insufficient documentation

## 2021-05-21 DIAGNOSIS — R1310 Dysphagia, unspecified: Secondary | ICD-10-CM | POA: Diagnosis not present

## 2021-05-21 DIAGNOSIS — J029 Acute pharyngitis, unspecified: Secondary | ICD-10-CM | POA: Diagnosis present

## 2021-05-21 LAB — CBC WITH DIFFERENTIAL/PLATELET
Abs Immature Granulocytes: 0.03 10*3/uL (ref 0.00–0.07)
Basophils Absolute: 0 10*3/uL (ref 0.0–0.1)
Basophils Relative: 0 %
Eosinophils Absolute: 0.2 10*3/uL (ref 0.0–0.5)
Eosinophils Relative: 3 %
HCT: 41.5 % (ref 36.0–46.0)
Hemoglobin: 14.1 g/dL (ref 12.0–15.0)
Immature Granulocytes: 0 %
Lymphocytes Relative: 20 %
Lymphs Abs: 1.8 10*3/uL (ref 0.7–4.0)
MCH: 31.1 pg (ref 26.0–34.0)
MCHC: 34 g/dL (ref 30.0–36.0)
MCV: 91.6 fL (ref 80.0–100.0)
Monocytes Absolute: 0.6 10*3/uL (ref 0.1–1.0)
Monocytes Relative: 6 %
Neutro Abs: 6.5 10*3/uL (ref 1.7–7.7)
Neutrophils Relative %: 71 %
Platelets: 277 10*3/uL (ref 150–400)
RBC: 4.53 MIL/uL (ref 3.87–5.11)
RDW: 12 % (ref 11.5–15.5)
WBC: 9.2 10*3/uL (ref 4.0–10.5)
nRBC: 0 % (ref 0.0–0.2)

## 2021-05-21 LAB — BASIC METABOLIC PANEL
Anion gap: 9 (ref 5–15)
BUN: 10 mg/dL (ref 6–20)
CO2: 21 mmol/L — ABNORMAL LOW (ref 22–32)
Calcium: 8.7 mg/dL — ABNORMAL LOW (ref 8.9–10.3)
Chloride: 108 mmol/L (ref 98–111)
Creatinine, Ser: 0.72 mg/dL (ref 0.44–1.00)
GFR, Estimated: >60 mL/min (ref >60)
Glucose, Bld: 79 mg/dL (ref 70–99)
Potassium: 3.6 mmol/L (ref 3.5–5.1)
Sodium: 138 mmol/L (ref 135–145)

## 2021-05-21 MED ORDER — LIDOCAINE VISCOUS HCL 2 % MT SOLN
15.0000 mL | Freq: Once | OROMUCOSAL | Status: AC
  Administered 2021-05-21: 15 mL via OROMUCOSAL
  Filled 2021-05-21: qty 15

## 2021-05-21 MED ORDER — DIPHENHYDRAMINE HCL 12.5 MG/5ML PO LIQD: 5.0000 mL | Freq: Three times a day (TID) | ORAL | 0 refills | Status: AC

## 2021-05-21 MED ORDER — IOHEXOL 350 MG/ML SOLN
65.0000 mL | Freq: Once | INTRAVENOUS | Status: AC | PRN
  Administered 2021-05-21: 65 mL via INTRAVENOUS

## 2021-05-21 NOTE — ED Notes (Signed)
DC instructions reviewed with pt. PT verbalized understanding. PT DC °

## 2021-05-21 NOTE — Discharge Instructions (Signed)
Follow-up with your primary care within the next 24 to 48 hours for reevaluation. ? ?A prescription for Magic mouthwash has been sent to your pharmacy.  Please use as directed, as needed, for throat pain. ? ?Return to the ED for new or worsening symptoms as discussed. ?

## 2021-05-21 NOTE — Consult Note (Signed)
Reason for Consult: Dysphagia ?Referring Physician: ER attending ? ?Luanna SalkValerie Frazier is an 31 y.o. female.  ?HPI: Ms. Cynthia Frazier presents with difficulty swallowing following an upper respiratory infection from a week ago.  She reports having awakened this morning and having difficulty even swallowing water.  She reports having a fever at home but she is afebrile on presentation in the ER having taken antipyretics.  She has had a negative strep test and is recently been treated with a course of antibiotics.  She denies any symptoms suggestive of hyperthyroidism.  She is a non-smoker. ? ?Past Medical History:  ?Diagnosis Date  ? Anxiety   ? Depression   ? Vitamin D deficiency   ? ? ?Past Surgical History:  ?Procedure Laterality Date  ? CESAREAN SECTION    ? CHOLECYSTECTOMY    ? ? ?Family History  ?Adopted: Yes  ? ? ?Social History:  reports that she has never smoked. She has never used smokeless tobacco. She reports current alcohol use. She reports that she does not use drugs. ? ?Allergies:  ?Allergies  ?Allergen Reactions  ? Sumatriptan Anaphylaxis  ? Vancomycin Anaphylaxis  ? Latex Hives  ? Levofloxacin Rash  ? ? ?Medications: I have reviewed the patient's current medications. ? ?Results for orders placed or performed during the hospital encounter of 05/21/21 (from the past 48 hour(s))  ?CBC with Differential     Status: None  ? Collection Time: 05/21/21  2:39 PM  ?Result Value Ref Range  ? WBC 9.2 4.0 - 10.5 K/uL  ? RBC 4.53 3.87 - 5.11 MIL/uL  ? Hemoglobin 14.1 12.0 - 15.0 g/dL  ? HCT 41.5 36.0 - 46.0 %  ? MCV 91.6 80.0 - 100.0 fL  ? MCH 31.1 26.0 - 34.0 pg  ? MCHC 34.0 30.0 - 36.0 g/dL  ? RDW 12.0 11.5 - 15.5 %  ? Platelets 277 150 - 400 K/uL  ? nRBC 0.0 0.0 - 0.2 %  ? Neutrophils Relative % 71 %  ? Neutro Abs 6.5 1.7 - 7.7 K/uL  ? Lymphocytes Relative 20 %  ? Lymphs Abs 1.8 0.7 - 4.0 K/uL  ? Monocytes Relative 6 %  ? Monocytes Absolute 0.6 0.1 - 1.0 K/uL  ? Eosinophils Relative 3 %  ? Eosinophils Absolute 0.2 0.0 -  0.5 K/uL  ? Basophils Relative 0 %  ? Basophils Absolute 0.0 0.0 - 0.1 K/uL  ? Immature Granulocytes 0 %  ? Abs Immature Granulocytes 0.03 0.00 - 0.07 K/uL  ?  Comment: Performed at [facility], [address] [place], [place] [address]  ?Basic metabolic panel     Status: Abnormal  ? Collection Time: 05/21/21  2:39 PM  ?Result Value Ref Range  ? Sodium 138 135 - 145 mmol/L  ? Potassium 3.6 3.5 - 5.1 mmol/L  ? Chloride 108 98 - 111 mmol/L  ? CO2 21 (L) 22 - 32 mmol/L  ? Glucose, Bld 79 70 - 99 mg/dL  ?  Comment: Glucose reference range applies only to samples taken after fasting for at least 8 hours.  ? BUN 10 6 - 20 mg/dL  ? Creatinine, Ser 0.72 0.44 - 1.00 mg/dL  ? Calcium 8.7 (L) 8.9 - 10.3 mg/dL  ? GFR, Estimated >60 >60 mL/min  ?  Comment: (NOTE) ?Calculated using the CKD-EPI Creatinine Equation (2021) ?  ? Anion gap 9 5 - 15  ?  Comment: Performed at [facility], [address] [place], [place] [address]  ? ? ?  CT Soft Tissue Neck W Contrast ? ?Result Date: 05/21/2021 ?CLINICAL DATA:  Neck mass, nonpulsatile EXAM: CT NECK WITH CONTRAST TECHNIQUE: Multidetector CT imaging of the neck was performed using the standard protocol following the bolus administration of intravenous contrast. RADIATION DOSE REDUCTION: This exam was performed according to the departmental dose-optimization program which includes automated exposure control, adjustment of the mA and/or kV according to patient size and/or use of iterative reconstruction technique. CONTRAST:  22mL OMNIPAQUE IOHEXOL 350 MG/ML SOLN COMPARISON:  None. FINDINGS: Pharynx and larynx: Slight enhancement and soft tissue prominence at the midline tongue base with effacement of the vallecula (for example series 3, image 49; series 5, image 59). Otherwise, no evidence of edema or mass. Salivary glands: No inflammation, mass, or stone. Thyroid: Normal. Lymph nodes: None enlarged or abnormal density. Vascular: Limited evaluation due to non arterial timing.  Major arteries in the neck are grossly patent. Limited intracranial: Negative. Visualized orbits: Negative. Mastoids and visualized paranasal sinuses: Mucosal thickening of ethmoid air cells and maxillary sinuses with probable left maxillary sinus retention cyst. Skeleton: No acute findings. Upper chest: Visualized lung apices are clear. IMPRESSION: 1. Slight enhancement and soft tissue prominence at the midline tongue base with effacement of the vallecula. This is favored to represent mildly prominent lingual tonsillar tissue; however, recommend correlation with direct inspection to exclude malignancy. 2. Paranasal sinus disease, as above. Electronically Signed   By: Feliberto Harts M.D.   On: 05/21/2021 17:00   ? ?Review of Systems ?Blood pressure 130/82, pulse 72, temperature 99.2 ?F (37.3 ?C), temperature source Oral, resp. rate 16, SpO2 100 %. ? ?Physical Exam ? ?Patient is able to speak in complete sentences without dyspnea.  She exhibits a pressed voice consistent with functional dysphonia ?Patient is lying supine in bed in no acute distress.  Overweight. ?Pupils equal round and reactive to light.  Conjugate gaze. ?Face atraumatic without bony step-offs or sinus tenderness ?Right septal deviation with normal pink mucosa and no polyps or masses ?Nasopharynx symmetric and clear without evidence of infection or exudate ?Salivary glands normal without mass or tenderness.  Thyroid gland nontender.  No cervical adenopathy ?Trachea midline.  No stridor.  Chest symmetric expansions bilaterally without use of accessory muscles. ?Cranial nerves II through XII intact.  No focal motor or sensory deficits noted. ? ?FFOL - Nasal cavity nasopharynx clear.  Base of tongue symmetric without mass or lingual tonsillar hypertrophy.  Epiglottis crisp.  Airway column widely patent.  True vocal cords move normally.  Piriforms well visualized to their apices without mass or pooling of secretions.  There is no evidence of any  asymmetry, erythema, or swelling on examination of her upper airway. ? ?CT scan - I reviewed the radiologist report regarding mild swelling at the base of the tongue.  I took a look at the actual films and there is no evidence of asymmetry or other mass to suggest a malignancy as mentioned in his report.  The airway column is widely patent.  The lingual tonsils are symmetric.  There is no cervical adenopathy.  The thyroid gland is unremarkable. ? ?Assessment/Plan: ? ?Dysphagia ? ?Based on review of the CT scan films, endoscopy at the bedside, lack of white blood cell count, lack of fever, and general absence of dyspnea or other findings on physical exam, I do not have specific recommendations.  The good news is she has no impending airway compromise.  Consideration could be given to a swallowing trial and/or swallow study given her dysphagia.  I do not see anything on her ENT exam or CT scan that would contribute to severe dysphagia.  Assuming she is able to take PO, she should follow-up with her primary care physician or return to the ER if symptoms worsen. ? ?Rejeana Brock ?05/21/2021, 7:25 PM  ? ? ? ? ?

## 2021-05-21 NOTE — ED Provider Triage Note (Signed)
Emergency Medicine Provider Triage Evaluation Note ? ?Cynthia Frazier , a 31 y.o. female  was evaluated in triage.  Pt complains of sore throat.  States that same has been worsening over the past 2 weeks with specific worsening in the last 5 days.  She was originally diagnosed with strep throat and given antibiotics for same.  States that today she is now unable to tolerate her own secretions and has been spitting into a bag. Hx of tonsillectomy and adenoidectomy as a child.  Was originally febrile but is no longer. ? ?Review of Systems  ?Positive: Sore throat ?Negative: Fevers, chills ? ?Physical Exam  ?BP 139/84 (BP Location: Right Arm)   Pulse 95   Temp 99.2 ?F (37.3 ?C) (Oral)   Resp 18   LMP  (LMP Unknown)   SpO2 100%  ?Gen:   Awake, no distress   ?Resp:  Normal effort  ?MSK:   Moves extremities without difficulty  ?Other:  Airway patent, lymphadenopathy present and appears bilateral, however patient is actively spitting secretions into a bag and drooling. ? ?Medical Decision Making  ?Medically screening exam initiated at 2:04 PM.  Appropriate orders placed.  Kortnie Stovall was informed that the remainder of the evaluation will be completed by another provider, this initial triage assessment does not replace that evaluation, and the importance of remaining in the ED until their evaluation is complete. ? ? ?  ?Silva Bandy, PA-C ?05/21/21 1406 ? ?

## 2021-05-21 NOTE — ED Triage Notes (Signed)
Pt. Stated, Cynthia Frazier had a respiratory problem for the last 17 days and have seen 2 doctors at 2 places. My throat is so sore I cant hardly swallow. I have a productive cough. ?

## 2021-05-21 NOTE — ED Provider Notes (Signed)
?[facility] EMERGENCY DEPARTMENT ?Provider Note ? ? ?CSN: [id number] ?Arrival date & time: 05/21/21  1250 ? ?  ? ?History ? ?Chief Complaint  ?Patient presents with  ? Sore Throat  ? Nasal Congestion  ? Fever  ? ? ?Cynthia SalkValerie Frazier is a 31 y.o. female with chief complaint of worsening cough, fever, congestion, sore throat, myalgias, and headache for the last 17 days.  Daughter initially diagnosed with strep when patient's symptoms started.  A few days later treated for ear infection and sore throat with cefdinir.  Throat pain worse over the last 3 days.  Highest fever at home 102.  Has been managing with daily ibuprofen and tylenol.  Woke up this morning with voice changes, difficulty/painful swallowing, and red facial rash.  Currently drooling and spitting salivary secretions into bag.   Endorses productive cough with yellow sputum.  Denies shortness of breath. ? ?Hx of tonsillectomy and adenoidectomy in childhood. ? ?The history is provided by the patient and medical records.  ?Sore Throat ?Pertinent negatives include no shortness of breath.  ?Fever ?Associated symptoms: congestion and sore throat   ? ?  ? ?Home Medications ?Prior to Admission medications   ?Medication Sig Start Date End Date Taking? Authorizing Provider  ?magic mouthwash (nystatin, diphenhydrAMINE, alum & mag hydroxide) suspension mixture Swish and swallow 5 mLs 3 (three) times daily for 5 days. 05/21/21 05/26/21 Yes Cecil Cobbsockerham, Mabeline Varas M, PA-C  ?albuterol (VENTOLIN HFA) 108 (90 Base) MCG/ACT inhaler Inhale 2 puffs into the lungs every 6 (six) hours as needed for wheezing or shortness of breath (Cough). 05/10/21   Theadora RamaMorgan, Lindsay Scales, PA-C  ?busPIRone (BUSPAR) 15 MG tablet Take 15 mg by mouth 3 (three) times daily. 10/17/20   [provider]  ?cetirizine (ZYRTEC ALLERGY) 10 MG tablet Take 1 tablet (10 mg total) by mouth at bedtime. 05/10/21 11/06/21  Theadora RamaMorgan, Lindsay Scales, PA-C  ?clonazePAM (KLONOPIN) 0.5 MG tablet Take 0.5 mg  by mouth daily as needed. 04/10/20   [provider]  ?doxepin (SINEQUAN) 25 MG capsule Take 25 mg by mouth at bedtime. 10/17/20   [provider]  ?ergocalciferol (VITAMIN D2) 1.25 MG (50000 UT) capsule  12/23/19   [provider]  ?guaifenesin (HUMIBID E) 400 MG TABS tablet Take 1 tablet 3 times daily as needed for chest congestion and cough 05/10/21   Theadora RamaMorgan, Lindsay Scales, PA-C  ?ibuprofen (ADVIL) 400 MG tablet Take 1 tablet (400 mg total) by mouth every 8 (eight) hours as needed for up to 30 doses. 05/10/21   Theadora RamaMorgan, Lindsay Scales, PA-C  ?ipratropium (ATROVENT) 0.06 % nasal spray Place 2 sprays into both nostrils 4 (four) times daily. As needed for nasal congestion, runny nose 05/10/21   Theadora RamaMorgan, Lindsay Scales, PA-C  ?mometasone (NASONEX) 50 MCG/ACT nasal spray Place 2 sprays into the nose daily. 05/10/21 08/08/21  Theadora RamaMorgan, Lindsay Scales, PA-C  ?prazosin (MINIPRESS) 1 MG capsule Take 1 capsule (1 mg total) by mouth at bedtime. 05/22/21   Tysinger, Kermit Baloavid S, PA-C  ?prazosin (MINIPRESS) 1 MG capsule Take 1 mg by mouth at bedtime.    [provider]  ?promethazine-dextromethorphan (PROMETHAZINE-DM) 6.25-15 MG/5ML syrup Take 5 mLs by mouth 4 (four) times daily as needed for cough. 05/10/21   Theadora RamaMorgan, Lindsay Scales, PA-C  ?   ? ?Allergies    ?Sumatriptan, Vancomycin, Latex, and Levofloxacin   ? ?Review of Systems   ?Review of Systems  ?Constitutional:  Positive for fever.  ?HENT:  Positive for congestion, sinus pressure, sore  throat, trouble swallowing and voice change.   ?Respiratory:  Negative for shortness of breath.   ?Psychiatric/Behavioral:  The patient is nervous/anxious.   ? ?Physical Exam ?Updated Vital Signs ?BP 109/63   Pulse 79   Temp 98.1 ?F (36.7 ?C) (Oral)   Resp 16   LMP  (LMP Unknown)   SpO2 99%  ?Physical Exam ?Vitals and nursing note reviewed.  ?Constitutional:   ?   General: She is not in acute distress. ?   Appearance: She is well-developed. She is not  ill-appearing or diaphoretic.  ?HENT:  ?   Head: Normocephalic and atraumatic.  ?   Right Ear: Tympanic membrane and ear canal normal. Tympanic membrane is not erythematous.  ?   Left Ear: Ear canal normal. Tympanic membrane is erythematous (Mild).  ?   Nose: Congestion present. No rhinorrhea.  ?   Mouth/Throat:  ?   Mouth: Mucous membranes are moist. No oral lesions.  ?   Pharynx: Oropharynx is clear. Uvula midline. No oropharyngeal exudate, posterior oropharyngeal erythema or uvula swelling.  ?   Tonsils: No tonsillar exudate or tonsillar abscesses.  ?Eyes:  ?   Conjunctiva/sclera: Conjunctivae normal.  ?Cardiovascular:  ?   Rate and Rhythm: Normal rate and regular rhythm.  ?   Heart sounds: Normal heart sounds. No murmur heard. ?Pulmonary:  ?   Effort: Pulmonary effort is normal. No respiratory distress.  ?   Breath sounds: Normal breath sounds.  ?Abdominal:  ?   Palpations: Abdomen is soft.  ?   Tenderness: There is no abdominal tenderness.  ?Musculoskeletal:     ?   General: No swelling.  ?   Cervical back: Neck supple.  ?Skin: ?   General: Skin is warm and dry.  ?   Capillary Refill: Capillary refill takes less than 2 seconds.  ?Neurological:  ?   Mental Status: She is alert and oriented to person, place, and time.  ?Psychiatric:     ?   Mood and Affect: Mood normal.  ? ? ?ED Results / Procedures / Treatments   ?Labs ?(all labs ordered are listed, but only abnormal results are displayed) ?Labs Reviewed  ?BASIC METABOLIC PANEL - Abnormal; Notable for the following components:  ?    Result Value  ? CO2 21 (*)   ? Calcium 8.7 (*)   ? All other components within normal limits  ?CBC WITH DIFFERENTIAL/PLATELET  ? ? ?EKG ?None ? ?Radiology ?CT Soft Tissue Neck W Contrast ? ?Result Date: 05/21/2021 ?CLINICAL DATA:  Neck mass, nonpulsatile EXAM: CT NECK WITH CONTRAST TECHNIQUE: Multidetector CT imaging of the neck was performed using the standard protocol following the bolus administration of intravenous contrast.  RADIATION DOSE REDUCTION: This exam was performed according to the departmental dose-optimization program which includes automated exposure control, adjustment of the mA and/or kV according to patient size and/or use of iterative reconstruction technique. CONTRAST:  84mL OMNIPAQUE IOHEXOL 350 MG/ML SOLN COMPARISON:  None. FINDINGS: Pharynx and larynx: Slight enhancement and soft tissue prominence at the midline tongue base with effacement of the vallecula (for example series 3, image 49; series 5, image 59). Otherwise, no evidence of edema or mass. Salivary glands: No inflammation, mass, or stone. Thyroid: Normal. Lymph nodes: None enlarged or abnormal density. Vascular: Limited evaluation due to non arterial timing. Major arteries in the neck are grossly patent. Limited intracranial: Negative. Visualized orbits: Negative. Mastoids and visualized paranasal sinuses: Mucosal thickening of ethmoid air cells and maxillary sinuses with probable left maxillary sinus  retention cyst. Skeleton: No acute findings. Upper chest: Visualized lung apices are clear. IMPRESSION: 1. Slight enhancement and soft tissue prominence at the midline tongue base with effacement of the vallecula. This is favored to represent mildly prominent lingual tonsillar tissue; however, recommend correlation with direct inspection to exclude malignancy. 2. Paranasal sinus disease, as above. Electronically Signed   By: Feliberto Harts M.D.   On: 05/21/2021 17:00   ? ?Procedures ?Procedures  ? ? ?Medications Ordered in ED ?Medications  ?iohexol (OMNIPAQUE) 350 MG/ML injection 65 mL (65 mLs Intravenous Contrast Given 05/21/21 1639)  ?lidocaine (XYLOCAINE) 2 % viscous mouth solution 15 mL (15 mLs Mouth/Throat Given 05/21/21 2128)  ? ? ?ED Course/ Medical Decision Making/ A&P ?  ?                        ?Medical Decision Making ?Amount and/or Complexity of Data Reviewed ?External Data Reviewed: notes. ?Labs: ordered. Decision-making details documented in ED  Course. ?Radiology: ordered and independent interpretation performed. Decision-making details documented in ED Course. ?ECG/medicine tests: ordered and independent interpretation performed. Decision-making details documen

## 2021-05-22 ENCOUNTER — Ambulatory Visit (INDEPENDENT_AMBULATORY_CARE_PROVIDER_SITE_OTHER): Payer: 59 | Admitting: Medical

## 2021-05-22 VITALS — BP 110/72 | HR 66 | Temp 98.5°F

## 2021-05-22 DIAGNOSIS — J029 Acute pharyngitis, unspecified: Secondary | ICD-10-CM | POA: Diagnosis not present

## 2021-05-22 DIAGNOSIS — R0989 Other specified symptoms and signs involving the circulatory and respiratory systems: Secondary | ICD-10-CM

## 2021-05-22 DIAGNOSIS — R0981 Nasal congestion: Secondary | ICD-10-CM | POA: Diagnosis not present

## 2021-05-22 DIAGNOSIS — R0683 Snoring: Secondary | ICD-10-CM

## 2021-05-22 DIAGNOSIS — R059 Cough, unspecified: Secondary | ICD-10-CM

## 2021-05-22 LAB — POCT MONO (EPSTEIN BARR VIRUS): Mono, POC: NEGATIVE

## 2021-05-22 MED ORDER — PRAZOSIN HCL 1 MG PO CAPS: 1.0000 mg | ORAL_CAPSULE | Freq: Every day | ORAL | 0 refills | Status: DC

## 2021-05-22 NOTE — Progress Notes (Signed)
Subjective: ? Cynthia Frazier is a 31 y.o. female who presents for ?Chief Complaint  ?Patient presents with  ? still feeling sick  ?  Still feeling sick, had CT and they saw something funny on it but then ENT said it was nothing. Still feels bad today but can swallow alittle bit since using the lidocaine maloxx medication  ?   ?Here for follow-up on illness.  I saw her initially on 05/13/21.   Prior to that visit she had seen urgent care about a week before that for respiratory symptoms including fever, cough, ear pressure, vomiting, face swelling, sinus pressure, congestion, had some yellow-tinged and bloody mucus from the nose, and felt lymph nodes swollen.  Initially had flu and COVID test that were negative.  She also checks COVID test regular since she works in [employer] and has had consistent negative COVID test ? ?At last visit her face still felt swollen, she was having a hard time swallowing although she did not have severe sore throat.  Still had a lot of congestion and sinus pressure ? ?She was concerned because she went back to the urgent care later in the week and they added on several medications.  They have been out of the steroid which she has not picked up from the pharmacy yet ? ?Her son had a strep exposure stay added on amoxicillin, added cough syrup, 2 different nasal sprays, cough medicine otherwise. ? ?At her 05/13/21 visit, we advised she stop Omnicef and just finish out the amoxicillin.  Likewise just use one nasal spray not both.  She will use Flonase.  We continued guaifenesin to help get up mucus.  Advised rest, hydrate well, can use salt water gargles and add nasal saline flush.   ? ?Since 05/13/21 visit, she went to emergency dept twice recently.  Most recent visit yesterday.  At that time she can barely swallow.  She had labs, CT imaging, ENT consult.  When she talked to ENT about CT scan results, they did not feel that she had any significant surgical issue.  Had tonsils and adenoids  removed as a kid.   ? ?Had PCN shot at urgent care.  Not on oral antibiotics currently. ? ?She is still taking allergy pill. ? ?Current symptoms:  still has really sore throat, can swallow today, better than yesterday.  Doesn't feel good, could sleep all day.  ? ?She denies history of GERD.  She does eat grapefruits several mornings a week.  Does eat some Chinese at home and a lot of other spicy foods.  no belching, no epigastrica pain. ? ?No x/o OSA.  Husband says she has been snoring a lot more, and she already has an [facility] on April 12 [facility] ? ?She notes Fatigue, PTSD, doesn't sleep good in general.   Used trazodone prior, on doxepin now, prazosin at night, been on prazosin 4 years, doxepin 6 mo.  Mostly sleeps on side ? ?Jewish, drinks small amount of alcohol on Friday and Saturday ? ?Still using zyrtec and nasal ? ?No other aggravating or relieving factors.   ? ?No other c/o. ? ?Past Medical History:  ?Diagnosis Date  ? Anxiety   ? Depression   ? Vitamin D deficiency   ? ?Current Outpatient Medications on File Prior to Visit  ?Medication Sig Dispense Refill  ? albuterol (VENTOLIN HFA) 108 (90 Base) MCG/ACT inhaler Inhale 2 puffs into the lungs every 6 (six) hours as needed for wheezing or  shortness of breath (Cough). 18 g 2  ? busPIRone (BUSPAR) 15 MG tablet Take 15 mg by mouth 3 (three) times daily.    ? cetirizine (ZYRTEC ALLERGY) 10 MG tablet Take 1 tablet (10 mg total) by mouth at bedtime. 90 tablet 1  ? clonazePAM (KLONOPIN) 0.5 MG tablet Take 0.5 mg by mouth daily as needed.    ? doxepin (SINEQUAN) 25 MG capsule Take 25 mg by mouth at bedtime.    ? ergocalciferol (VITAMIN D2) 1.25 MG (50000 UT) capsule     ? guaifenesin (HUMIBID E) 400 MG TABS tablet Take 1 tablet 3 times daily as needed for chest congestion and cough 21 tablet 0  ? ibuprofen (ADVIL) 400 MG tablet Take 1 tablet (400 mg total) by mouth every 8 (eight) hours as needed for up to 30 doses. 30 tablet 0   ? ipratropium (ATROVENT) 0.06 % nasal spray Place 2 sprays into both nostrils 4 (four) times daily. As needed for nasal congestion, runny nose 15 mL 2  ? magic mouthwash (nystatin, diphenhydrAMINE, alum & mag hydroxide) suspension mixture Swish and swallow 5 mLs 3 (three) times daily for 5 days. 80 mL 0  ? prazosin (MINIPRESS) 1 MG capsule Take 1 mg by mouth at bedtime.    ? promethazine-dextromethorphan (PROMETHAZINE-DM) 6.25-15 MG/5ML syrup Take 5 mLs by mouth 4 (four) times daily as needed for cough. 180 mL 0  ? mometasone (NASONEX) 50 MCG/ACT nasal spray Place 2 sprays into the nose daily. 1 each 5  ? ?No current facility-administered medications on file prior to visit.  ? ? ? ?The following portions of the patient's history were reviewed and updated as appropriate: allergies, current medications, past family history, past medical history, past social history, past surgical history and problem list. ? ?ROS ?Otherwise as in subjective above ? ? ? ?Objective: ?BP 110/72   Pulse 66   Temp 98.5 ?F (36.9 ?C)   LMP  (LMP Unknown)  ?BP 110/72   Pulse 66   Temp 98.5 ?F (36.9 ?C)   LMP  (LMP Unknown)  ? ?Wt Readings from Last 3 Encounters:  ?05/13/21 257 lb 12.8 oz (116.9 kg)  ?07/11/20 235 lb (106.6 kg)  ?04/11/20 239 lb 6.4 oz (108.6 kg)  ? ?General appearance: alert, no distress, well developed, well nourished, sounds nasally congested ?HEENT: normocephalic, sclerae anicteric, conjunctiva pink and moist, TMs flat, nares with turbinate edema and watery discharge in both nostrils, no erythema ?Oral cavity: MMM, no lesions ?Neck: supple, no lymphadenopathy, no thyromegaly, no masses ?Heart: RRR, normal S1, S2, no murmurs ?Lungs: CTA bilaterally, no wheezes, rhonchi, or rales ?Pulses: 2+ radial pulses, 2+ pedal pulses, normal cap refill ?Ext: no edema ? ? ?CT soft tissue neck: ?IMPRESSION: ?1. Slight enhancement and soft tissue prominence at the midline ?tongue base with effacement of the vallecula. This is favored  to ?represent mildly prominent lingual tonsillar tissue; however, ?recommend correlation with direct inspection to exclude malignancy. ?2. Paranasal sinus disease, as above. ?  ? ? ?Assessment: ?Encounter Diagnoses  ?Name Primary?  ? Sore throat Yes  ? Throat fullness   ? Nasal congestion   ? Cough, unspecified type   ? Snoring   ? ? ? ? ?Plan: ?We discussed her symptoms and concerns.  I reviewed her recent emergency department notes, labs, CT imaging.  Her basic metabolic lab was normal other than slightly low CO2 and calcium, CBC normal ? ?For thoroughness we did a monotest today which was negative.  Thyroid labs  today. ? ?I do suspect ongoing allergy problems so she will continue allergy p.o. nasal spray.  Continue nasal saline flush with salt water.  Consider allergy testing going forward ? ?She already has an appointment with sleep center for sleep apnea evaluation soon ? ?We discussed other things that could cause throat inflammation or swelling or irritation such as acid reflux, throat clearing or other.  Consider Pepcid short-term daily over-the-counter.  Consider avoiding GERD trigger foods. ? ?Follow up: pending labs, f/u with [facility]. ? ? ? ? ?

## 2021-05-23 ENCOUNTER — Other Ambulatory Visit: Payer: Self-pay | Admitting: Medical

## 2021-05-23 DIAGNOSIS — R1310 Dysphagia, unspecified: Secondary | ICD-10-CM

## 2021-05-23 DIAGNOSIS — J309 Allergic rhinitis, unspecified: Secondary | ICD-10-CM

## 2021-05-23 DIAGNOSIS — R221 Localized swelling, mass and lump, neck: Secondary | ICD-10-CM

## 2021-05-23 LAB — TSH+FREE T4
Free T4: 1.31 ng/dL (ref 0.82–1.77)
TSH: 2.41 u[IU]/mL (ref 0.450–4.500)

## 2021-05-23 NOTE — Progress Notes (Signed)
Refer to a ?

## 2021-06-03 NOTE — Progress Notes (Signed)
? ?Complete physical exam ? ? ?Patient: Cynthia Frazier   DOB: 1990-11-19   31 y.o. Female  MRN: [id number] ?Visit Date: 06/04/2021 ? ?Chief Complaint  ?Patient presents with  ? Annual Exam  ?  Non fasting CPE- Weight loss, wants referral to dermatologist   ? ?Subjective  ?  ?Cynthia Frazier is a 31 y.o. female who presents today for a complete physical exam.  ? ?Reports is generally fairly well; is eating a healthy diet; is sleeping 3 - 4 hours of sleep; drinks 4 - 6  bottles of water a day; is exercising walks the dog 7 days a week, gym 3 - 5 days a week. ? ?Patient reports that she has been eating smaller portions, cutting about snacks, has been gaining weight for the past 18 months but is not able to lose weight; requests a [facility] to [facility]. ? ?Also reports that she has had fatigue, numbness and tingling in her hands and feet, multiple joint pains, and toenail fungus for a while.  ? ?HPI ?HPI   ? ? Annual Exam   ? Additional comments: Non fasting CPE- Weight loss, wants referral to dermatologist  ? ?  ?  ?Last edited by Deforest Hoyles, [place] on 06/04/2021  2:29 PM.  ?  ?  ? ? ?Past Medical History:  ?Diagnosis Date  ? Anxiety   ? Depression   ? Dysphagia   ? Left upper quadrant abdominal pain 07/11/2020  ? Nausea and vomiting 07/11/2020  ? Throat pain   ? Vitamin D deficiency   ? ?Past Surgical History:  ?Procedure Laterality Date  ? CESAREAN SECTION    ? CHOLECYSTECTOMY    ? ?Social History  ? ?Socioeconomic History  ? Marital status: Married  ?  Spouse name: Not on file  ? Number of children: Not on file  ? Years of education: Not on file  ? Highest education level: Not on file  ?Occupational History  ? Not on file  ?Tobacco Use  ? Smoking status: Never  ? Smokeless tobacco: Never  ?Vaping Use  ? Vaping Use: Never used  ?Substance and Sexual Activity  ? Alcohol use: Yes  ?  Comment: 2 times/week  ? Drug use: Never  ? Sexual activity: Not on file  ?Other Topics Concern  ? Not on file  ?Social  History Narrative  ? Not on file  ? ?Social Determinants of Health  ? ?Financial Resource Strain: Not on file  ?Food Insecurity: Not on file  ?Transportation Needs: Not on file  ?Physical Activity: Not on file  ?Stress: Not on file  ?Social Connections: Not on file  ?Intimate Partner Violence: Not on file  ? ?Family Status  ?Relation Name Status  ? Mother  Deceased  ? Father  Other  ? ?Family History  ?Adopted: Yes  ? ?Allergies  ?Allergen Reactions  ? Sumatriptan Anaphylaxis  ? Vancomycin Anaphylaxis  ? Latex Hives  ? Levofloxacin Rash  ?  ?Patient Care Team: ?Marcellina Millin as PCP - General (Physician Assistant)  ? ?Medications: ?Outpatient Medications Prior to Visit  ?Medication Sig  ? albuterol (VENTOLIN HFA) 108 (90 Base) MCG/ACT inhaler Inhale 2 puffs into the lungs every 6 (six) hours as needed for wheezing or shortness of breath (Cough).  ? busPIRone (BUSPAR) 15 MG tablet Take 15 mg by mouth 3 (three) times daily.  ? cetirizine (ZYRTEC ALLERGY) 10 MG tablet Take 1 tablet (10 mg total) by mouth at bedtime.  ? clonazePAM (  KLONOPIN) 0.5 MG tablet Take 0.5 mg by mouth daily as needed.  ? doxepin (SINEQUAN) 25 MG capsule Take 25 mg by mouth at bedtime.  ? ergocalciferol (VITAMIN D2) 1.25 MG (50000 UT) capsule   ? ibuprofen (ADVIL) 400 MG tablet Take 1 tablet (400 mg total) by mouth every 8 (eight) hours as needed for up to 30 doses.  ? ipratropium (ATROVENT) 0.06 % nasal spray Place 2 sprays into both nostrils 4 (four) times daily. As needed for nasal congestion, runny nose  ? prazosin (MINIPRESS) 1 MG capsule Take 1 capsule (1 mg total) by mouth at bedtime.  ? prazosin (MINIPRESS) 1 MG capsule Take 1 mg by mouth at bedtime.  ? sertraline (ZOLOFT) 100 MG tablet Take 100 mg by mouth daily.  ? [DISCONTINUED] guaifenesin (HUMIBID E) 400 MG TABS tablet Take 1 tablet 3 times daily as needed for chest congestion and cough (Patient not taking: Reported on 06/04/2021)  ? [DISCONTINUED] mometasone (NASONEX) 50  MCG/ACT nasal spray Place 2 sprays into the nose daily.  ? [DISCONTINUED] promethazine-dextromethorphan (PROMETHAZINE-DM) 6.25-15 MG/5ML syrup Take 5 mLs by mouth 4 (four) times daily as needed for cough. (Patient not taking: Reported on 06/04/2021)  ? ?No facility-administered medications prior to visit.  ? ? ?Review of Systems  ?Constitutional:  Positive for fatigue. Negative for activity change, appetite change and fever.  ?HENT:  Negative for congestion, ear pain and voice change.   ?Eyes:  Negative for redness.  ?Respiratory:  Negative for cough.   ?Cardiovascular:  Negative for chest pain.  ?Gastrointestinal:  Negative for constipation and diarrhea.  ?Endocrine: Negative for polyuria.  ?Genitourinary:  Negative for flank pain.  ?Musculoskeletal:  Positive for arthralgias. Negative for gait problem, joint swelling and neck stiffness.  ?Skin:  Negative for color change and rash.  ?Neurological:  Positive for numbness. Negative for dizziness.  ?Hematological:  Negative for adenopathy.  ?Psychiatric/Behavioral:  Negative for agitation, behavioral problems and confusion.   ? ?Last CBC ?Lab Results  ?Component Value Date  ? WBC 9.2 05/21/2021  ? HGB 14.1 05/21/2021  ? HCT 41.5 05/21/2021  ? MCV 91.6 05/21/2021  ? MCH 31.1 05/21/2021  ? RDW 12.0 05/21/2021  ? PLT 277 05/21/2021  ? ?Last metabolic panel ?Lab Results  ?Component Value Date  ? GLUCOSE 79 05/21/2021  ? NA 138 05/21/2021  ? K 3.6 05/21/2021  ? CL 108 05/21/2021  ? CO2 21 (L) 05/21/2021  ? BUN 10 05/21/2021  ? CREATININE 0.72 05/21/2021  ? GFRNONAA >60 05/21/2021  ? CALCIUM 8.7 (L) 05/21/2021  ? PROT 6.9 07/11/2020  ? ALBUMIN 4.5 07/11/2020  ? LABGLOB 2.4 07/11/2020  ? AGRATIO 1.9 07/11/2020  ? BILITOT 0.4 07/11/2020  ? ALKPHOS 79 07/11/2020  ? AST 23 07/11/2020  ? ALT 45 (H) 07/11/2020  ? ANIONGAP 9 05/21/2021  ? ?Last lipids ?No results found for: CHOL, HDL, LDLCALC, LDLDIRECT, TRIG, CHOLHDL ?Last hemoglobin A1c ?No results found for: HGBA1C ?Last thyroid  functions ?Lab Results  ?Component Value Date  ? TSH 2.410 05/22/2021  ? ?Last vitamin D ?No results found for: 25OHVITD2, [place], VD25OH ?Last vitamin B12 and Folate ?No results found for: VITAMINB12, FOLATE ?  ? ?The ASCVD Risk score (Arnett DK, et al., 2019) failed to calculate for the following reasons: ?  The 2019 ASCVD risk score is only valid for ages 57 to 79 ? ? Objective  ?  ?BP 110/80   Pulse 71   Ht 5\' 5"  (1.651 m)   Wt  255 lb (115.7 kg)   LMP  (LMP Unknown)   SpO2 97%   BMI 42.43 kg/m?  ? ?Wt Readings from Last 3 Encounters:  ?06/04/21 255 lb (115.7 kg)  ?05/13/21 257 lb 12.8 oz (116.9 kg)  ?07/11/20 235 lb (106.6 kg)  ? ?  ? ? ?Physical Exam ?Vitals and nursing note reviewed.  ?Constitutional:   ?   General: She is not in acute distress. ?   Appearance: Normal appearance. She is not ill-appearing.  ?HENT:  ?   Head: Normocephalic and atraumatic.  ?   Right Ear: Tympanic membrane, ear canal and external ear normal.  ?   Left Ear: Tympanic membrane, ear canal and external ear normal.  ?   Nose: No congestion.  ?Eyes:  ?   Extraocular Movements: Extraocular movements intact.  ?   Conjunctiva/sclera: Conjunctivae normal.  ?   Pupils: Pupils are equal, round, and reactive to light.  ?Neck:  ?   Vascular: No carotid bruit.  ?Cardiovascular:  ?   Rate and Rhythm: Normal rate and regular rhythm.  ?   Pulses: Normal pulses.  ?   Heart sounds: Normal heart sounds.  ?Pulmonary:  ?   Effort: Pulmonary effort is normal.  ?   Breath sounds: Normal breath sounds. No wheezing.  ?Abdominal:  ?   General: Bowel sounds are normal.  ?   Palpations: Abdomen is soft.  ?Musculoskeletal:     ?   General: Normal range of motion.  ?   Cervical back: Normal range of motion and neck supple.  ?   Right lower leg: No edema.  ?   Left lower leg: No edema.  ?Feet:  ?   Right foot:  ?   Toenail Condition: Fungal disease present. ?   Left foot:  ?   Toenail Condition: Fungal disease present. ?Skin: ?   General: Skin is warm  and dry.  ?   Findings: No bruising.  ?Neurological:  ?   General: No focal deficit present.  ?   Mental Status: She is alert and oriented to person, place, and time.  ?Psychiatric:     ?   Mood and Affect: Mood no

## 2021-06-04 ENCOUNTER — Ambulatory Visit (INDEPENDENT_AMBULATORY_CARE_PROVIDER_SITE_OTHER): Payer: 59 | Admitting: Physician Assistant

## 2021-06-04 ENCOUNTER — Encounter: Payer: Self-pay | Admitting: Physician Assistant

## 2021-06-04 VITALS — BP 110/80 | HR 71 | Ht 65.0 in | Wt 255.0 lb

## 2021-06-04 DIAGNOSIS — J45909 Unspecified asthma, uncomplicated: Secondary | ICD-10-CM | POA: Insufficient documentation

## 2021-06-04 DIAGNOSIS — Z0000 Encounter for general adult medical examination without abnormal findings: Secondary | ICD-10-CM | POA: Diagnosis not present

## 2021-06-04 DIAGNOSIS — R5383 Other fatigue: Secondary | ICD-10-CM

## 2021-06-04 DIAGNOSIS — R200 Anesthesia of skin: Secondary | ICD-10-CM

## 2021-06-04 DIAGNOSIS — E66813 Obesity, class 3: Secondary | ICD-10-CM | POA: Insufficient documentation

## 2021-06-04 DIAGNOSIS — M2550 Pain in unspecified joint: Secondary | ICD-10-CM

## 2021-06-04 DIAGNOSIS — L301 Dyshidrosis [pompholyx]: Secondary | ICD-10-CM

## 2021-06-04 DIAGNOSIS — Z1159 Encounter for screening for other viral diseases: Secondary | ICD-10-CM

## 2021-06-04 DIAGNOSIS — R5381 Other malaise: Secondary | ICD-10-CM | POA: Diagnosis not present

## 2021-06-04 DIAGNOSIS — R202 Paresthesia of skin: Secondary | ICD-10-CM

## 2021-06-04 DIAGNOSIS — B351 Tinea unguium: Secondary | ICD-10-CM

## 2021-06-04 DIAGNOSIS — Z114 Encounter for screening for human immunodeficiency virus [HIV]: Secondary | ICD-10-CM

## 2021-06-04 MED ORDER — CICLOPIROX 8 % EX SOLN: Freq: Every day | CUTANEOUS | 0 refills | Status: DC

## 2021-06-04 NOTE — Patient Instructions (Addendum)

## 2021-06-05 ENCOUNTER — Other Ambulatory Visit: Payer: 59

## 2021-06-06 ENCOUNTER — Other Ambulatory Visit: Payer: 59

## 2021-06-06 DIAGNOSIS — R202 Paresthesia of skin: Secondary | ICD-10-CM

## 2021-06-06 DIAGNOSIS — Z0000 Encounter for general adult medical examination without abnormal findings: Secondary | ICD-10-CM

## 2021-06-06 DIAGNOSIS — Z1159 Encounter for screening for other viral diseases: Secondary | ICD-10-CM

## 2021-06-06 DIAGNOSIS — Z114 Encounter for screening for human immunodeficiency virus [HIV]: Secondary | ICD-10-CM

## 2021-06-06 DIAGNOSIS — R5381 Other malaise: Secondary | ICD-10-CM

## 2021-06-06 DIAGNOSIS — M2550 Pain in unspecified joint: Secondary | ICD-10-CM

## 2021-06-06 NOTE — Assessment & Plan Note (Signed)
Stable, hx/o of lesions on feet, requests a referral to Dermatology for further evaluation ?

## 2021-06-06 NOTE — Assessment & Plan Note (Signed)
Will moitor, Po treatments discussed, patient will try OTC Penlac ?

## 2021-06-06 NOTE — Assessment & Plan Note (Signed)
Stable, drink 8 - 10 glasses of water daily, limit sugar intake and eat a low fat diet, exercise 3 - 5 days a week, example walking 1 - 2 miles, will refer to [facility] for weight loss evaluation per patient request ? ?

## 2021-06-07 LAB — LIPID PANEL
Chol/HDL Ratio: 4 ratio (ref 0.0–4.4)
Cholesterol, Total: 157 mg/dL (ref 100–199)
HDL: 39 mg/dL — ABNORMAL LOW (ref >39)
LDL Chol Calc (NIH): 103 mg/dL — ABNORMAL HIGH (ref 0–99)
Triglycerides: 77 mg/dL (ref 0–149)
VLDL Cholesterol Cal: 15 mg/dL (ref 5–40)

## 2021-06-07 LAB — HEPATITIS C ANTIBODY: Hep C Virus Ab: NONREACTIVE

## 2021-06-07 LAB — HEMOGLOBIN A1C
Est. average glucose Bld gHb Est-mCnc: 108 mg/dL
Hgb A1c MFr Bld: 5.4 % (ref 4.8–5.6)

## 2021-06-07 LAB — HIV ANTIBODY (ROUTINE TESTING W REFLEX): HIV Screen 4th Generation wRfx: NONREACTIVE

## 2021-06-07 LAB — VITAMIN D 25 HYDROXY (VIT D DEFICIENCY, FRACTURES): Vit D, 25-Hydroxy: 23.7 ng/mL — ABNORMAL LOW (ref 30.0–100.0)

## 2021-06-07 LAB — VITAMIN B12: Vitamin B-12: 975 pg/mL (ref 232–1245)

## 2021-06-11 ENCOUNTER — Encounter: Payer: Self-pay | Admitting: Physician Assistant

## 2021-06-11 DIAGNOSIS — E559 Vitamin D deficiency, unspecified: Secondary | ICD-10-CM | POA: Insufficient documentation

## 2021-06-23 ENCOUNTER — Other Ambulatory Visit: Payer: Self-pay

## 2021-06-23 ENCOUNTER — Ambulatory Visit (HOSPITAL_COMMUNITY)
Admission: EM | Admit: 2021-06-23 | Discharge: 2021-06-23 | Disposition: A | Discharge status: Home / Self Care | Payer: 59 | Attending: Internal Medicine | Admitting: Internal Medicine

## 2021-06-23 ENCOUNTER — Encounter (HOSPITAL_COMMUNITY): Payer: Self-pay | Admitting: Emergency Medicine

## 2021-06-23 DIAGNOSIS — Z7951 Long term (current) use of inhaled steroids: Secondary | ICD-10-CM | POA: Diagnosis not present

## 2021-06-23 DIAGNOSIS — R0602 Shortness of breath: Secondary | ICD-10-CM | POA: Insufficient documentation

## 2021-06-23 DIAGNOSIS — R059 Cough, unspecified: Secondary | ICD-10-CM | POA: Insufficient documentation

## 2021-06-23 DIAGNOSIS — J069 Acute upper respiratory infection, unspecified: Secondary | ICD-10-CM | POA: Diagnosis not present

## 2021-06-23 DIAGNOSIS — Z20822 Contact with and (suspected) exposure to covid-19: Secondary | ICD-10-CM | POA: Diagnosis not present

## 2021-06-23 MED ORDER — BENZONATATE 100 MG PO CAPS: 100.0000 mg | ORAL_CAPSULE | Freq: Three times a day (TID) | ORAL | 0 refills | Status: DC | PRN

## 2021-06-23 NOTE — Discharge Instructions (Signed)
It appears that you have a viral upper respiratory infection that should run its course and self resolve on its own in the next few days with symptomatic treatment.  Please continue your albuterol inhaler and I would recommend that you use it about every 4-6 hours.  You have been prescribed a cough medication to take as needed.  I would recommend that you use Flonase as well.  Follow-up if symptoms persist or worsen. ?

## 2021-06-23 NOTE — ED Provider Notes (Signed)
?[place] ? ? ? ?CSN: [id number] ?Arrival date & time: 06/23/21  1452 ? ? ?  ? ?History   ?Chief Complaint ?Chief Complaint  ?Patient presents with  ? Cough  ? ? ?HPI ?Cynthia Frazier is a 31 y.o. female.  ? ?Patient presents with cough, shortness of breath, nasal congestion, fever that started approximately 3 days ago.  Tmax at home was 102 today.  Denies any associated sore throat, ear pain, chest pain, nausea, vomiting, diarrhea, abdominal pain.  Patient does not report taking any medications for symptoms.  Denies history of asthma but it is documented in patient's chart.  Shortness of breath only occurs with exertion.  She has used albuterol inhaler once that has provided improvement. ? ? ?Cough ? ?Past Medical History:  ?Diagnosis Date  ? Anxiety   ? Depression   ? Dysphagia   ? Left upper quadrant abdominal pain 07/11/2020  ? Nausea and vomiting 07/11/2020  ? Throat pain   ? Vitamin D deficiency   ? ? ?Patient Active Problem List  ? Diagnosis Date Noted  ? Vitamin D deficiency 06/11/2021  ? Asthma 06/04/2021  ? Obesity, morbid, BMI 40.0-49.9 ([place]) 06/04/2021  ? Onychomycosis 06/04/2021  ? Eczema, dyshidrotic 06/04/2021  ? S/P cholecystectomy 07/11/2020  ? ? ?Past Surgical History:  ?Procedure Laterality Date  ? CESAREAN SECTION    ? CHOLECYSTECTOMY    ? ? ?OB History   ?No obstetric history on file. ?  ? ? ? ?Home Medications   ? ?Prior to Admission medications   ?Medication Sig Start Date End Date Taking? Authorizing Provider  ?benzonatate (TESSALON) 100 MG capsule Take 1 capsule (100 mg total) by mouth every 8 (eight) hours as needed for cough. 06/23/21  Yes Teodora Medici, FNP  ?fluticasone (FLONASE) 50 MCG/ACT nasal spray Place into both nostrils daily.   Yes [provider]  ?albuterol (VENTOLIN HFA) 108 (90 Base) MCG/ACT inhaler Inhale 2 puffs into the lungs every 6 (six) hours as needed for wheezing or shortness of breath (Cough). 05/10/21   Lynden Oxford Scales, PA-C  ?busPIRone  (BUSPAR) 15 MG tablet Take 15 mg by mouth 3 (three) times daily. 10/17/20   [provider]  ?cetirizine (ZYRTEC ALLERGY) 10 MG tablet Take 1 tablet (10 mg total) by mouth at bedtime. 05/10/21 11/06/21  Lynden Oxford Scales, PA-C  ?ciclopirox (PENLAC) 8 % solution Apply topically at bedtime. Apply over nail and surrounding skin. Apply daily over previous coat. After seven (7) days, may remove with alcohol and continue cycle. 06/04/21   Irene Pap, PA-C  ?clonazePAM (KLONOPIN) 0.5 MG tablet Take 0.5 mg by mouth daily as needed. 04/10/20   [provider]  ?doxepin (SINEQUAN) 25 MG capsule Take 25 mg by mouth at bedtime. 10/17/20   [provider]  ?ergocalciferol (VITAMIN D2) 1.25 MG (50000 UT) capsule  12/23/19   [provider]  ?ibuprofen (ADVIL) 400 MG tablet Take 1 tablet (400 mg total) by mouth every 8 (eight) hours as needed for up to 30 doses. 05/10/21   Lynden Oxford Scales, PA-C  ?ipratropium (ATROVENT) 0.06 % nasal spray Place 2 sprays into both nostrils 4 (four) times daily. As needed for nasal congestion, runny nose 05/10/21   Lynden Oxford Scales, PA-C  ?prazosin (MINIPRESS) 1 MG capsule Take 1 capsule (1 mg total) by mouth at bedtime. 05/22/21   Tysinger, Camelia Eng, PA-C  ?prazosin (MINIPRESS) 1 MG capsule Take 1 mg by mouth at bedtime.    [provider]  ?sertraline (ZOLOFT) 100 MG tablet Take 100 mg by mouth daily.    [provider]  ? ? ?Family History ?Family History  ?Adopted: Yes  ? ? ?Social History ?Social History  ? ?Tobacco Use  ? Smoking status: Never  ? Smokeless tobacco: Never  ?Vaping Use  ? Vaping Use: Never used  ?Substance Use Topics  ? Alcohol use: Yes  ?  Comment: 2 times/week  ? Drug use: Never  ? ? ? ?Allergies   ?Sumatriptan, Vancomycin, Latex, and Levofloxacin ? ? ?Review of Systems ?Review of Systems ?Per HPI ? ?Physical Exam ?Triage Vital Signs ?ED Triage Vitals  ?Enc Vitals Group  ?   BP 06/23/21 1537 115/76  ?   Pulse  Rate 06/23/21 1537 79  ?   Resp 06/23/21 1537 (!) 22  ?   Temp 06/23/21 1537 98.3 ?F (36.8 ?C)  ?   Temp Source 06/23/21 1537 Oral  ?   SpO2 06/23/21 1537 98 %  ?   Weight --   ?   Height --   ?   Head Circumference --   ?   Peak Flow --   ?   Pain Score 06/23/21 1533 1  ?   Pain Loc --   ?   Pain Edu? --   ?   Excl. in [place]? --   ? ?No data found. ? ?Updated Vital Signs ?BP 115/76 (BP Location: Left Arm) Comment (BP Location): large cuff  Pulse 79   Temp 98.3 ?F (36.8 ?C) (Oral)   Resp (!) 22   SpO2 98%  ? ?Visual Acuity ?Right Eye Distance:   ?Left Eye Distance:   ?Bilateral Distance:   ? ?Right Eye Near:   ?Left Eye Near:    ?Bilateral Near:    ? ?Physical Exam ?Constitutional:   ?   General: She is not in acute distress. ?   Appearance: Normal appearance. She is not toxic-appearing or diaphoretic.  ?HENT:  ?   Head: Normocephalic and atraumatic.  ?   Right Ear: Tympanic membrane and ear canal normal.  ?   Left Ear: Tympanic membrane and ear canal normal.  ?   Nose: Congestion present.  ?   Mouth/Throat:  ?   Mouth: Mucous membranes are moist.  ?   Pharynx: No posterior oropharyngeal erythema.  ?Eyes:  ?   Extraocular Movements: Extraocular movements intact.  ?   Conjunctiva/sclera: Conjunctivae normal.  ?   Pupils: Pupils are equal, round, and reactive to light.  ?Cardiovascular:  ?   Rate and Rhythm: Normal rate and regular rhythm.  ?   Pulses: Normal pulses.  ?   Heart sounds: Normal heart sounds.  ?Pulmonary:  ?   Effort: Pulmonary effort is normal. No respiratory distress.  ?   Breath sounds: Normal breath sounds. No stridor. No wheezing, rhonchi or rales.  ?Abdominal:  ?   General: Abdomen is flat. Bowel sounds are normal.  ?   Palpations: Abdomen is soft.  ?Musculoskeletal:     ?   General: Normal range of motion.  ?   Cervical back: Normal range of motion.  ?Skin: ?   General: Skin is warm and dry.  ?Neurological:  ?   General: No focal deficit present.  ?   Mental Status: She is alert and oriented to  person, place, and time. Mental status is at baseline.  ?Psychiatric:     ?   Mood and Affect: Mood normal.     ?  Behavior: Behavior normal.  ? ? ? ?UC Treatments / Results  ?Labs ?(all labs ordered are listed, but only abnormal results are displayed) ?Labs Reviewed  ?SARS CORONAVIRUS 2 (TAT 6-24 HRS)  ? ? ?EKG ? ? ?Radiology ?No results found. ? ?Procedures ?Procedures (including critical care time) ? ?Medications Ordered in UC ?Medications - No data to display ? ?Initial Impression / Assessment and Plan / UC Course  ?I have reviewed the triage vital signs and the nursing notes. ? ?Pertinent labs & imaging results that were available during my care of the patient were reviewed by me and considered in my medical decision making (see chart for details). ? ?  ? ?Patient presents with symptoms likely from a viral upper respiratory infection. Differential includes bacterial pneumonia, sinusitis, allergic rhinitis, COVID-19. Do not suspect underlying cardiopulmonary process. Symptoms seem unlikely related to ACS, CHF or COPD exacerbations, pneumonia, pneumothorax. Patient is nontoxic appearing and not in need of emergent medical intervention.  COVID test pending. ? ?No wheezing on exam so do not think that prednisone is necessary.  No evidence of lung sounds so do not think that chest imaging is necessary at this time.  Patient encouraged to use albuterol as needed for shortness of breath given that it has provided relief. ? ?Recommended symptom control with over the counter medications.  Patient sent prescriptions and encouraged to use Flonase which she states that she has at home. ? ?Return if symptoms fail to improve. Patient states understanding and is agreeable. ? ?Discharged with PCP followup.  ?Final Clinical Impressions(s) / UC Diagnoses  ? ?Final diagnoses:  ?Viral URI with cough  ?Shortness of breath  ? ? ? ?Discharge Instructions   ? ?  ?It appears that you have a viral upper respiratory infection that  should run its course and self resolve on its own in the next few days with symptomatic treatment.  Please continue your albuterol inhaler and I would recommend that you use it about every 4-6 hours.  You have been pr

## 2021-06-23 NOTE — ED Triage Notes (Signed)
Onset Friday of not feeling well.  Patient has a cough, sob, fever of 102 today per patient, no sore throat, has sinus congestion ?

## 2021-06-24 LAB — SARS CORONAVIRUS 2 (TAT 6-24 HRS): SARS Coronavirus 2: NEGATIVE

## 2021-06-26 ENCOUNTER — Ambulatory Visit (INDEPENDENT_AMBULATORY_CARE_PROVIDER_SITE_OTHER): Payer: 59

## 2021-06-26 ENCOUNTER — Ambulatory Visit
Admission: RE | Admit: 2021-06-26 | Discharge: 2021-06-26 | Disposition: A | Discharge status: Home / Self Care | Payer: 59 | Source: Ambulatory Visit | Attending: Emergency Medicine | Admitting: Emergency Medicine

## 2021-06-26 VITALS — BP 108/65 | HR 85 | Temp 98.6°F | Resp 18

## 2021-06-26 DIAGNOSIS — J302 Other seasonal allergic rhinitis: Secondary | ICD-10-CM

## 2021-06-26 DIAGNOSIS — R059 Cough, unspecified: Secondary | ICD-10-CM

## 2021-06-26 DIAGNOSIS — H66002 Acute suppurative otitis media without spontaneous rupture of ear drum, left ear: Secondary | ICD-10-CM

## 2021-06-26 DIAGNOSIS — R0602 Shortness of breath: Secondary | ICD-10-CM

## 2021-06-26 DIAGNOSIS — J4521 Mild intermittent asthma with (acute) exacerbation: Secondary | ICD-10-CM

## 2021-06-26 DIAGNOSIS — R058 Other specified cough: Secondary | ICD-10-CM

## 2021-06-26 DIAGNOSIS — J0140 Acute pansinusitis, unspecified: Secondary | ICD-10-CM

## 2021-06-26 MED ORDER — GUAIFENESIN 400 MG PO TABS: ORAL_TABLET | ORAL | 0 refills | Status: DC

## 2021-06-26 MED ORDER — FLUTICASONE PROPIONATE 50 MCG/ACT NA SUSP: 1.0000 | Freq: Every day | NASAL | 1 refills | Status: DC

## 2021-06-26 MED ORDER — FLUCONAZOLE 150 MG PO TABS: ORAL_TABLET | ORAL | 0 refills | Status: DC

## 2021-06-26 MED ORDER — CEFDINIR 300 MG PO CAPS: 300.0000 mg | ORAL_CAPSULE | Freq: Two times a day (BID) | ORAL | 0 refills | Status: AC

## 2021-06-26 MED ORDER — PROMETHAZINE-DM 6.25-15 MG/5ML PO SYRP: 5.0000 mL | ORAL_SOLUTION | Freq: Four times a day (QID) | ORAL | 0 refills | Status: DC | PRN

## 2021-06-26 MED ORDER — IBUPROFEN 400 MG PO TABS: 400.0000 mg | ORAL_TABLET | Freq: Three times a day (TID) | ORAL | 0 refills | Status: DC | PRN

## 2021-06-26 MED ORDER — BUDESONIDE-FORMOTEROL FUMARATE 160-4.5 MCG/ACT IN AERO: 2.0000 | INHALATION_SPRAY | Freq: Two times a day (BID) | RESPIRATORY_TRACT | 0 refills | Status: DC

## 2021-06-26 NOTE — Discharge Instructions (Signed)
On physical exam today, I believe that you have developed a bacterial infection in your left inner ear.  I believe that this is a reflection of a possible bacterial infection in your sinuses as well. ? ?Please see the list below for recommended medications, dosages and frequencies to provide relief of your current symptoms:   ?  ?Cefdinir (Omnicef):  1 capsule twice daily for 10 days, you can take it with or without food.  A prescription has been sent to your pharmacy.  This antibiotic can cause upset stomach, this will resolve once antibiotics are complete.  You are welcome to use a probiotic, eat yogurt, take Imodium while taking this medication.  Please avoid other systemic medications such as Maalox, Pepto-Bismol or milk of magnesia as they can interfere with your body's ability to absorb the antibiotics. ? ?Fluconazole (Diflucan): As you may or may not be aware, taking antibiotics can often cause patients to develop a vaginal yeast infection.  For this reason, I provided you with a prescription for Diflucan.  Please take the first Diflucan tablet on day 3 or 4 of your antibiotic therapy, and take the second Diflucan tablet 3 days later., It is very important that you take antibiotics as prescribed.  If you skip doses or do not complete the full course of antibiotics, you put yourself at significant risk of recurrent infection which can often be worse than your initial infection. ?  ? ?Your symptoms and my physical exam findings are concerning for exacerbation of your underlying allergies.  It is important that you begin your allergy regimen now and are consistent with taking allergy medications exactly as prescribed.  Allergy medications are preventative and therefore only work well when they are taken daily, not "as needed". ? ?Please see the list below for recommended medications, dosages and frequencies to provide relief of current symptoms:   ?  ?Advil, Motrin (ibuprofen): This is a good anti-inflammatory  medication which not only addresses aches, pains but also significantly reduces soft tissue inflammation of the upper airways that causes sinus and nasal congestion as well as inflammation of the lower airways which makes you feel like your breathing is constricted or your cough feel tight.  I recommend that you take between 400 mg every 8 hours as needed.   A prescription has been sent to your pharmacy. ? ?Zyrtec (cetirizine): This is an excellent second-generation antihistamine that helps to reduce respiratory inflammatory response to environmental allergens.  In some patients, this medication can cause daytime sleepiness so I recommend that you take 1 tablet daily at bedtime.  You were provided with a prescription for this at her previous visit. ?  ?Flonase (fluticasone): This is a steroid nasal spray that you use once daily, 1 spray in each nare.  This medication does not work well if you decide to use it only used as you feel you need to, it works best used on a daily basis.  After 3 to 5 days of use, you will notice significant reduction of the inflammation and mucus production that is currently being caused by exposure to allergens, whether seasonal or environmental.  The most common side effect of this medication is nosebleeds.  If you experience a nosebleed, please discontinue use for 1 week, then feel free to resume.  I have provided you with a prescription. ?  ?Atrovent (ipratropium): This is an excellent nasal decongestant spray that does not cause rebound congestion, please instill 2 sprays into each nare with each use.  Because nasal steroids can take several days before they begin to provide full benefit, I recommend that you use this spray in addition to the nasal steroid prescribed for you.  Please use it after you have used your nasal steroid and repeat up to 4 times daily as needed.  You were provided with with a prescription for this medication at a previous visit.    ?  ?  ?Your symptoms and my  physical exam findings are concerning for reactive airway disease with an acute exacerbation.  Reactive airways disease is when your lower airway becomes inflamed due to viral infection, bacterial infection or allergy exposure. ?   ?Please see the list below for recommended medications, dosages and frequencies to provide relief of your current symptoms:   ?  ?ProAir, Ventolin, Proventil (albuterol): This inhaled medication contains a short acting beta agonist bronchodilator.  This medication works on the smooth muscle that opens and constricts of your airways by relaxing the muscle.  The result of relaxation of the smooth muscle is increased air movement and improved work of breathing.  This is a short acting medication that can be used every 4-6 hours as needed for increased work of breathing, shortness of breath, wheezing and excessive coughing.  You were provided with this prescription at a previous visit.  ?  ?Symbicort (budesonide and formoterol): Please inhale 2 puffs twice daily.  This inhaled medication contains a corticosteroid and long-acting form of albuterol.  The inhaled steroid and this medication  is not absorbed into the body and will not cause side effects such as increased blood sugar levels, irritability, sleeplessness or weight gain.  Inhaled corticosteroid are sort of like topical steroid creams but, as you can imagine, it is not practical to attempt to rub a steroid cream inside of your lungs.  The long-acting albuterol works similarly to the short acting albuterol found in your rescue inhaler but provides 24-hour relaxation of the smooth muscles that open and constrict your airways; your short acting rescue inhaler can only provide for a few hours this benefit for a few hours.  Please feel free to continue using your short acting rescue inhaler as often as needed throughout the day for shortness of breath, wheezing, and cough.  Please contact us if this medication is too expensive so that we  can find alternatives that might be more affordable.  Please discuss continuing this medication with your primary care provider if you feel it has been helpful.  It is considered a maintenance medication and is only being used "off label" at this time. ? ? ?Finally, I recommend these for relief of your cough and to keep your lungs and sinuses clear of excess mucus: ?  ?Guaifenesin (Robitussin, Mucinex): This is an expectorant.  This helps break up chest congestion and loosen up thick nasal drainage making phlegm and drainage more liquid and therefore easier to remove.  I recommend being 400 mg three times daily as needed.   I provided you with a prescription for this medication. ?  ?Promethazine DM: Promethazine is both a nasal decongestant and an antinausea medication that makes most patients feel fairly sleepy.  The DM is dextromethorphan, a cough suppressant found in many over-the-counter cough medications.  Please take 5 mL before bedtime to minimize your cough which will help you sleep better.  I have provided you with a prescription for this medication.    ? ?  ?Please follow-up within the next 7-10 days either with your primary care  provider or urgent care if your symptoms do not resolve.  If you do not have a primary care provider, we will assist you in finding one. ?  ? Thank you for visiting urgent care today.  We appreciate the opportunity to participate in your care. ? ?

## 2021-06-26 NOTE — ED Provider Notes (Signed)
?[facility] ? ? ? ?CSN: [id number] ?Arrival date & time: 06/26/21  0940 ?  ? ?HISTORY  ? ?Chief Complaint  ?Patient presents with  ? Cough  ?  wet cough, shortness of breath, negative COVID test - Entered by patient  ? Shortness of Breath  ? ?HPI ?Cynthia Frazier is a 31 y.o. female. Patient complains of productive cough and shortness of breath that began 5 days ago.  Patient states she had a negative COVID test on April 23.  Patient states she has had some pressure and pain in her ears, right greater than left.  Patient states she has had congestion, sinus pain, sinus pressure on both sides upper and lower, feels like her eyes are going to pop out.  Patient denies fever, body ache, chills, nausea, vomiting, diarrhea, loss of taste or smell.  Patient reports history of allergies, currently taking Zyrtec. ? ?The history is provided by the patient.  ?Past Medical History:  ?Diagnosis Date  ? Anxiety   ? Depression   ? Dysphagia   ? Left upper quadrant abdominal pain 07/11/2020  ? Nausea and vomiting 07/11/2020  ? Throat pain   ? Vitamin D deficiency   ? ?Patient Active Problem List  ? Diagnosis Date Noted  ? Vitamin D deficiency 06/11/2021  ? Asthma 06/04/2021  ? Obesity, morbid, BMI 40.0-49.9 ([facility]) 06/04/2021  ? Onychomycosis 06/04/2021  ? Eczema, dyshidrotic 06/04/2021  ? S/P cholecystectomy 07/11/2020  ? ?Past Surgical History:  ?Procedure Laterality Date  ? CESAREAN SECTION    ? CHOLECYSTECTOMY    ? ?OB History   ?No obstetric history on file. ?  ? ?Home Medications   ? ?Prior to Admission medications   ?Medication Sig Start Date End Date Taking? Authorizing Provider  ?budesonide-formoterol (SYMBICORT) 160-4.5 MCG/ACT inhaler Inhale 2 puffs into the lungs in the morning and at bedtime. 06/26/21 07/26/21 Yes Theadora Rama Scales, PA-C  ?cefdinir (OMNICEF) 300 MG capsule Take 1 capsule (300 mg total) by mouth 2 (two) times daily for 10 days. 06/26/21 07/06/21 Yes Theadora Rama Scales, PA-C  ?fluconazole  (DIFLUCAN) 150 MG tablet Take 1 tablet on day 4 of antibiotics.  Take second tablet 3 days later. 06/26/21  Yes Theadora Rama Scales, PA-C  ?fluticasone (FLONASE) 50 MCG/ACT nasal spray Place 1 spray into both nostrils daily. Begin by using 2 sprays in each nare daily for 3 to 5 days, then decrease to 1 spray in each nare daily. 06/26/21  Yes Theadora Rama Scales, PA-C  ?guaifenesin (HUMIBID E) 400 MG TABS tablet Take 1 tablet 3 times daily as needed for chest congestion and cough 06/26/21  Yes Theadora Rama Scales, PA-C  ?ibuprofen (ADVIL) 400 MG tablet Take 1 tablet (400 mg total) by mouth every 8 (eight) hours as needed for up to 30 doses. 06/26/21  Yes Theadora Rama Scales, PA-C  ?promethazine-dextromethorphan (PROMETHAZINE-DM) 6.25-15 MG/5ML syrup Take 5 mLs by mouth 4 (four) times daily as needed for cough. 06/26/21  Yes Theadora Rama Scales, PA-C  ?albuterol (VENTOLIN HFA) 108 (90 Base) MCG/ACT inhaler Inhale 2 puffs into the lungs every 6 (six) hours as needed for wheezing or shortness of breath (Cough). 05/10/21   Theadora Rama Scales, PA-C  ?busPIRone (BUSPAR) 15 MG tablet Take 15 mg by mouth 3 (three) times daily. 10/17/20   [provider]  ?cetirizine (ZYRTEC ALLERGY) 10 MG tablet Take 1 tablet (10 mg total) by mouth at bedtime. 05/10/21 11/06/21  Theadora Rama Scales, PA-C  ?ciclopirox (PENLAC) 8 % solution  Apply topically at bedtime. Apply over nail and surrounding skin. Apply daily over previous coat. After seven (7) days, may remove with alcohol and continue cycle. 06/04/21   Jake Shark, PA-C  ?clonazePAM (KLONOPIN) 0.5 MG tablet Take 0.5 mg by mouth daily as needed. 04/10/20   [provider]  ?doxepin (SINEQUAN) 25 MG capsule Take 25 mg by mouth at bedtime. 10/17/20   [provider]  ?ergocalciferol (VITAMIN D2) 1.25 MG (50000 UT) capsule  12/23/19   [provider]  ?ipratropium (ATROVENT) 0.06 % nasal spray Place 2 sprays into both nostrils 4 (four)  times daily. As needed for nasal congestion, runny nose 05/10/21   Theadora Rama Scales, PA-C  ?prazosin (MINIPRESS) 1 MG capsule Take 1 capsule (1 mg total) by mouth at bedtime. 05/22/21   Tysinger, Kermit Balo, PA-C  ?prazosin (MINIPRESS) 1 MG capsule Take 1 mg by mouth at bedtime.    [provider]  ?sertraline (ZOLOFT) 100 MG tablet Take 100 mg by mouth daily.    [provider]  ? ?Family History ?Family History  ?Adopted: Yes  ? ?Social History ?Social History  ? ?Tobacco Use  ? Smoking status: Never  ? Smokeless tobacco: Never  ?Vaping Use  ? Vaping Use: Never used  ?Substance Use Topics  ? Alcohol use: Yes  ?  Comment: 2 times/week  ? Drug use: Never  ? ?Allergies   ?Sumatriptan, Vancomycin, Latex, and Levofloxacin ? ?Review of Systems ?Review of Systems ?Pertinent findings noted in history of present illness.  ? ?Physical Exam ?Triage Vital Signs ?ED Triage Vitals  ?Enc Vitals Group  ?   BP 12/28/20 0827 (!) 147/82  ?   Pulse Rate 12/28/20 0827 72  ?   Resp 12/28/20 0827 18  ?   Temp 12/28/20 0827 98.3 ?F (36.8 ?C)  ?   Temp Source 12/28/20 0827 Oral  ?   SpO2 12/28/20 0827 98 %  ?   Weight --   ?   Height --   ?   Head Circumference --   ?   Peak Flow --   ?   Pain Score 12/28/20 0826 5  ?   Pain Loc --   ?   Pain Edu? --   ?   Excl. in [place]? --   ?No data found. ? ?Updated Vital Signs ?BP 108/65 (BP Location: Right Arm)   Pulse 85   Temp 98.6 ?F (37 ?C) (Oral)   Resp 18   SpO2 96%  ? ?Physical Exam ?Vitals and nursing note reviewed.  ?Constitutional:   ?   General: She is not in acute distress. ?   Appearance: Normal appearance. She is not ill-appearing.  ?HENT:  ?   Head: Normocephalic and atraumatic.  ?   Salivary Glands: Right salivary gland is not diffusely enlarged or tender. Left salivary gland is not diffusely enlarged or tender.  ?   Right Ear: Hearing, ear canal and external ear normal. No drainage. A middle ear effusion is present. There is no impacted cerumen. Tympanic membrane  is bulging. Tympanic membrane is not injected or erythematous.  ?   Left Ear: Hearing, ear canal and external ear normal. No drainage. A middle ear effusion (Suppurative) is present. There is no impacted cerumen. Tympanic membrane is retracted. Tympanic membrane is not injected, perforated, erythematous or bulging.  ?   Nose: Rhinorrhea present. No nasal deformity, septal deviation, signs of injury, nasal tenderness, mucosal edema or congestion. Rhinorrhea is clear.  ?  Right Nostril: Occlusion present. No foreign body, epistaxis or septal hematoma.  ?   Left Nostril: Occlusion present. No foreign body, epistaxis or septal hematoma.  ?   Right Turbinates: Enlarged, swollen and pale.  ?   Left Turbinates: Enlarged, swollen and pale.  ?   Right Sinus: No maxillary sinus tenderness or frontal sinus tenderness.  ?   Left Sinus: No maxillary sinus tenderness or frontal sinus tenderness.  ?   Mouth/Throat:  ?   Lips: Pink. No lesions.  ?   Mouth: Mucous membranes are moist. No oral lesions.  ?   Pharynx: Oropharynx is clear. Uvula midline. No posterior oropharyngeal erythema or uvula swelling.  ?   Tonsils: No tonsillar exudate. 0 on the right. 0 on the left.  ?   Comments: Postnasal drip ?Eyes:  ?   General: Lids are normal.     ?   Right eye: No discharge.     ?   Left eye: No discharge.  ?   Extraocular Movements: Extraocular movements intact.  ?   Conjunctiva/sclera: Conjunctivae normal.  ?   Right eye: Right conjunctiva is not injected.  ?   Left eye: Left conjunctiva is not injected.  ?Neck:  ?   Trachea: Trachea and phonation normal.  ?Cardiovascular:  ?   Rate and Rhythm: Normal rate and regular rhythm.  ?   Pulses: Normal pulses.  ?   Heart sounds: Normal heart sounds. No murmur heard. ?  No friction rub. No gallop.  ?Pulmonary:  ?   Effort: Pulmonary effort is normal. No tachypnea, bradypnea, accessory muscle usage, prolonged expiration, respiratory distress or retractions.  ?   Breath sounds: Normal breath  sounds. No stridor, decreased air movement or transmitted upper airway sounds. No decreased breath sounds, wheezing, rhonchi or rales.  ?   Comments: Turbulent breath sounds throughout. ?Chest:  ?   Chest wall: N

## 2021-06-26 NOTE — ED Triage Notes (Addendum)
Pt c/o productive cough, and SOB that began last Friday. Patient states she had a negative covid test.  ?

## 2021-07-18 ENCOUNTER — Ambulatory Visit: Payer: 59 | Admitting: Allergy

## 2021-07-18 ENCOUNTER — Encounter: Payer: Self-pay | Admitting: Allergy

## 2021-07-18 VITALS — BP 114/78 | HR 84 | Temp 97.7°F | Resp 16 | Ht 65.0 in | Wt 256.2 lb

## 2021-07-18 DIAGNOSIS — L2089 Other atopic dermatitis: Secondary | ICD-10-CM | POA: Diagnosis not present

## 2021-07-18 DIAGNOSIS — L2489 Irritant contact dermatitis due to other agents: Secondary | ICD-10-CM

## 2021-07-18 DIAGNOSIS — J3089 Other allergic rhinitis: Secondary | ICD-10-CM | POA: Diagnosis not present

## 2021-07-18 DIAGNOSIS — J4520 Mild intermittent asthma, uncomplicated: Secondary | ICD-10-CM

## 2021-07-18 DIAGNOSIS — T781XXD Other adverse food reactions, not elsewhere classified, subsequent encounter: Secondary | ICD-10-CM

## 2021-07-18 DIAGNOSIS — H1013 Acute atopic conjunctivitis, bilateral: Secondary | ICD-10-CM

## 2021-07-18 MED ORDER — RYALTRIS 665-25 MCG/ACT NA SUSP: 2.0000 | Freq: Two times a day (BID) | NASAL | 5 refills | Status: DC

## 2021-07-18 MED ORDER — OLOPATADINE HCL 0.2 % OP SOLN: 1.0000 [drp] | Freq: Two times a day (BID) | OPHTHALMIC | 5 refills | Status: AC

## 2021-07-18 MED ORDER — MONTELUKAST SODIUM 10 MG PO TABS: 10.0000 mg | ORAL_TABLET | Freq: Every day | ORAL | 5 refills | Status: DC

## 2021-07-18 MED ORDER — FEXOFENADINE HCL 180 MG PO TABS: 180.0000 mg | ORAL_TABLET | Freq: Every day | ORAL | 5 refills | Status: DC

## 2021-07-18 NOTE — Progress Notes (Signed)
New Patient Note  RE: Cynthia Frazier MRN: [id number] DOB: 09-11-1990 Date of Office Visit: 07/18/2021   Primary care provider: Jake Shark, PA-C  Chief Complaint: allergies  History of present illness: Cynthia Frazier is a 31 y.o. female presenting today for evaluation of allergic rhinitis.    [name]  Last spring and fall she noted allergy symptoms which could be controlled with zyrtec or xyzal.   This spring however symptoms have been terrible and she has been in to see her PCP multiple times and UC for the symptoms.   Symptoms include lots of nasal congestion, blowing nose, throat clearing, itchy eyes, watery eyes.  She also has had productive cough and shortness of breath when the allergy symptoms are worse or if she gets a URI.  She has had an albuterol inhaler.  She also states she had a powder inhaler to use which she is not still using at this time.  She believes it was Airduo.  She has been recommended to use flonase.  She has done sinus rinses.  She has used pataday that works the best.  She thinks she has a cat allergy as will sneeze more exposure.  She states sometimes may get sinus infections about twice a year treated with antibiotic.    She believes she has contact dermatitis and about twice a month her hands and feet can get itchy, develop rash and swelling.  She has also developed a rash on the back of her thighs after sitting on a surface.  She states her mother told her she had asthma as a child however Cynthia Frazier has no memory of any asthma as a child.    She has history of eczema that flares on feet that can blister.  Also arm involvement mostly during warmer months.   Uses triamcinolone.  Will be seeing dermatologist soon.   She does reports avocado, kiwi, banana can cause itchy mouth and if touches kiwi gets a red/itchy eyes.  She states her mother has told her as a child she had anaphylaxis to latex.  As  an adult at the dentist if they have use latex gloves she does report an itchy red rash that can develop.  Review of systems: Review of Systems  Constitutional: Negative.   HENT:         See HPI  Eyes:        See HPI  Respiratory:         See HPI  Cardiovascular: Negative.   Gastrointestinal: Negative.   Musculoskeletal: Negative.   Skin: Negative.   Allergic/Immunologic: Negative.   Neurological: Negative.    All other systems negative unless noted above in HPI  Past medical history: Past Medical History:  Diagnosis Date   Anxiety    Depression    Dysphagia    Left upper quadrant abdominal pain 07/11/2020   Nausea and vomiting 07/11/2020   Throat pain    Vitamin D deficiency     Past surgical history: Past Surgical History:  Procedure Laterality Date   CESAREAN SECTION     CHOLECYSTECTOMY      Family history:  Family History  Adopted: Yes    Social history: Lives in a townhome with carpeting with electric heating and central and fan cooling.  Dog and 2 cats in the home.  There is no concern for water damage, mildew or roaches in the home.  She is an  accountant.  She denies a smoking history.   Medication List: Current Outpatient Medications  Medication Sig Dispense Refill   busPIRone (BUSPAR) 15 MG tablet Take 20 mg by mouth 3 (three) times daily.     ciclopirox (PENLAC) 8 % solution Apply topically at bedtime. Apply over nail and surrounding skin. Apply daily over previous coat. After seven (7) days, may remove with alcohol and continue cycle. 6.6 mL 0   diphenhydrAMINE HCl (BENADRYL ALLERGY PO) Take by mouth.     ergocalciferol (VITAMIN D2) 1.25 MG (50000 UT) capsule      fexofenadine (ALLEGRA ALLERGY) 180 MG tablet Take 1 tablet (180 mg total) by mouth daily. 30 tablet 5   montelukast (SINGULAIR) 10 MG tablet Take 1 tablet (10 mg total) by mouth at bedtime. 30 tablet 5   Olopatadine HCl 0.2 % SOLN Apply 1 drop to eye 2 (two) times daily. 2.5 mL 5    Olopatadine-Mometasone (RYALTRIS) 665-25 MCG/ACT SUSP Place 2 sprays into the nose 2 (two) times daily. 29 g 5   prazosin (MINIPRESS) 1 MG capsule Take 1 capsule (1 mg total) by mouth at bedtime. 30 capsule 0   sertraline (ZOLOFT) 100 MG tablet Take 100 mg by mouth daily.     clonazePAM (KLONOPIN) 0.5 MG tablet Take 0.5 mg by mouth daily as needed. (Patient not taking: Reported on 07/18/2021)     No current facility-administered medications for this visit.    Known medication allergies: Allergies  Allergen Reactions   Sumatriptan Anaphylaxis   Vancomycin Anaphylaxis   Latex Hives   Levofloxacin Rash     Physical examination: Blood pressure 114/78, pulse 84, temperature 97.7 F (36.5 C), resp. rate 16, height 5\' 5"  (1.651 m), weight 256 lb 4 oz (116.2 kg), SpO2 98 %.  General: Alert, interactive, in no acute distress. HEENT: PERRLA, TMs pearly gray, turbinates moderately edematous without discharge, post-pharynx non erythematous. Neck: Supple without lymphadenopathy. Lungs: Clear to auscultation without wheezing, rhonchi or rales. {no increased work of breathing. CV: Normal S1, S2 without murmurs. Abdomen: Nondistended, nontender. Skin: Warm and dry, without lesions or rashes. Extremities:  No clubbing, cyanosis or edema. Neuro:   Grossly intact.  Diagnositics/Labs:  Spirometry: FEV1: 2.52 L 77%, FVC: 2.92 L 75% predicted.  Status post albuterol she had an increase in FEV1 to 2.55 L 78% which is not significant.  Allergy testing:   Airborne Adult Perc - 07/18/21 1000     Time Antigen Placed 1003    Allergen Manufacturer 07/20/21    Location Back    1. Control-Buffer 50% Glycerol Negative    2. Control-Histamine 1 mg/ml 2+    3. Albumin saline Negative    4. Bahia Negative    5. Waynette Buttery Negative    6. Johnson Negative    7. Kentucky Blue Negative    8. Meadow Fescue Negative    9. Perennial Rye Negative    10. Sweet Vernal Negative    11. Timothy Negative    12.  Cocklebur Negative    13. Burweed Marshelder Negative    14. Ragweed, short Negative    15. Ragweed, Giant Negative    16. Plantain,  English Negative    17. Lamb's Quarters Negative    18. Sheep Sorrell Negative    19. Rough Pigweed Negative    20. Marsh Elder, Rough Negative    21. Mugwort, Common Negative    22. Ash mix Negative    23. Birch mix Negative    24. French Southern Territories  American Negative    25. Box, Elder Negative    26. Cedar, red Negative    27. Cottonwood, Guinea-Bissau Negative    28. Elm mix Negative    29. Hickory Negative    30. Maple mix Negative    31. Oak, Guinea-[place]issau mix Negative    32. Pecan Pollen Negative    33. Pine mix Negative    34. Sycamore Eastern Negative    35. Walnut, Black Pollen Negative    36. Alternaria alternata 2+    37. Cladosporium Herbarum Negative    39. Penicillium mix Negative    40. Bipolaris sorokiniana (Helminthosporium) Negative    41. Drechslera spicifera (Curvularia) Negative    42. Mucor plumbeus Negative    43. Fusarium moniliforme Negative    44. Aureobasidium pullulans (pullulara) Negative    45. Rhizopus oryzae Negative    46. Botrytis cinera Negative    47. Epicoccum nigrum 2+    48. Phoma betae 2+    49. Candida Albicans Negative    50. Trichophyton mentagrophytes Negative    51. Mite, D Farinae  5,000 AU/ml Negative    52. Mite, D Pteronyssinus  5,000 AU/ml Negative    53. Cat Hair 10,000 BAU/ml 2+    54.  Dog Epithelia Negative    55. Mixed Feathers Negative    56. Horse Epithelia Negative    57. Cockroach, German Negative    58. Mouse Negative    59. Tobacco Leaf Negative             Intradermal - 07/18/21 1045     Time Antigen Placed 1050    Allergen Manufacturer Greer    Location Arm    Number of Test 13    Control Negative    French Southern Territories Negative    Johnson Negative    7 Grass Negative    Ragweed mix Negative    Weed mix Negative    Tree mix Negative    Mold 2 Negative    Mold 3 Negative    Mold 4 Negative     Dog 2+    Cockroach Negative    Mite mix 2+             Food Adult Perc - 07/18/21 1300     Time Antigen Placed 1003    Allergen Manufacturer Greer    Location Back    Number of allergen test 2    48. Avocado Negative    57. Banana Negative             Allergy testing results were read and interpreted by provider, documented by clinical staff.   Assessment and plan: Allergic rhinitis with conjunctivitis - Testing today showed: indoor molds, outdoor molds, dust mites, cat, and dog. - Copy of test results provided.  - Avoidance measures provided. - Start taking:  Allegra (fexofenadine) 180mg  tablet once daily.  This is a long-acting antihistamine that may be more effective than Zyrtec or Xyzal.   Singulair (montelukast) 10mg  daily.  This is an antileukotriene that helps with allergy and breathing symptom control.  It works alongside antihistamine for better control of symptoms. If you notice any change in mood/behavior/sleep after starting Singulair then stop this medication and let know.  Symptoms resolve after stopping the medication.   Ryaltris (olopatadine/mometasone) two sprays per nostril 1-2 times daily as needed for nasal congestion or drainage control.  This is a combination spray with olopatadine, nasal antihistamine, for drainage control, and mometasone, nasal steroid  spray, for congestion control Pataday (olopatadine) one drop per eye twice daily as needed for itchy watery eyes - You can use an extra dose of the antihistamine, if needed, for breakthrough symptoms.  - Continue nasal saline rinses 1-2 times daily to remove allergens from the nasal cavities as well as help with mucous clearance (this is especially helpful to do before the nasal sprays are given) - Consider allergy shots as a means of long-term control. - Allergy shots "re-train" and "reset" the immune system to ignore environmental allergens and decrease the resulting immune response to those  allergens (sneezing, itchy watery eyes, runny nose, nasal congestion, etc).    - Allergy shots improve symptoms in 80-85% of patients.  - We can discuss more at a future appointment if the medications are not working for you.  Reactive airway - Have access to albuterol inhaler 2 puffs every 4-6 hours as needed for cough/wheeze/shortness of breath/chest tightness.  May use 15-20 minutes prior to activity.   Monitor frequency of use.   - Lung function testing is just slightly lower than expected norms and did improve slightly after albuterol use in office -Singulair as above  Adverse food reaction - Testing to banana and avocado are negative thus she do not have direct food allergy to these foods - It is likely that you have latex sensitivity which can be cross-reactive with banana, kiwi, avocado foods. 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  Recommend performing patch testing with the TRUE test patch panels.  Patches are best placed on a Monday with return to office on Wednesday and Friday of same week for readings.  Once patches are in place to do not get them wet.  You can take antihistamines while patches are in place.  Recommend checking with the dermatologist to see if they have a more comprehensive patch testing.  If interested in our patch test you can schedule for patch placement.  Follow-up in 4-6 months or sooner if needed  Total of 75 minutes, greater than 50% of which was spent in discussion of treatment and management options.   I  appreciate the opportunity to take part in Parissa's care. Please do not hesitate to contact me with questions.  Sincerely,   Margo AyeShaylar Zohra Clavel, MD Allergy/Immunology [facility]

## 2021-07-18 NOTE — Patient Instructions (Addendum)
Allergies - Testing today showed: indoor molds, outdoor molds, dust mites, cat, and dog. - Copy of test results provided.  - Avoidance measures provided. - Start taking:  Allegra (fexofenadine) 180mg  tablet once daily.  This is a long-acting antihistamine that may be more effective than Zyrtec or Xyzal.   Singulair (montelukast) 10mg  daily.  This is an antileukotriene that helps with allergy and breathing symptom control.  It works alongside antihistamine for better control of symptoms. If you notice any change in mood/behavior/sleep after starting Singulair then stop this medication and let know.  Symptoms resolve after stopping the medication.   Ryaltris (olopatadine/mometasone) two sprays per nostril 1-2 times daily as needed for nasal congestion or drainage control.  This is a combination spray with olopatadine, nasal antihistamine, for drainage control, and mometasone, nasal steroid spray, for congestion control Pataday (olopatadine) one drop per eye twice daily as needed for itchy watery eyes - You can use an extra dose of the antihistamine, if needed, for breakthrough symptoms.  - Continue nasal saline rinses 1-2 times daily to remove allergens from the nasal cavities as well as help with mucous clearance (this is especially helpful to do before the nasal sprays are given) - Consider allergy shots as a means of long-term control. - Allergy shots "re-train" and "reset" the immune system to ignore environmental allergens and decrease the resulting immune response to those allergens (sneezing, itchy watery eyes, runny nose, nasal congestion, etc).    - Allergy shots improve symptoms in 80-85% of patients.  - We can discuss more at a future appointment if the medications are not working for you.  Reactive airway - Have access to albuterol inhaler 2 puffs every 4-6 hours as needed for cough/wheeze/shortness of breath/chest tightness.  May use 15-20 minutes prior to activity.   Monitor frequency  of use.   - Lung function testing is just slightly lower than expected norms and did improve slightly after albuterol use in office -Singulair as above  Adverse food reaction - Testing to banana and avocado are negative thus she do not have direct food allergy to these foods - It is likely that you have latex sensitivity which can be cross-reactive with banana, kiwi, avocado foods. 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  Recommend performing patch testing with the TRUE test patch panels.  Patches are best placed on a Monday with return to office on Wednesday and Friday of same week for readings.  Once patches are in place to do not get them wet.  You can take antihistamines while patches are in place.  Recommend checking with the dermatologist to see if they have a more comprehensive patch testing.  If interested in our patch test you can schedule for patch placement.  True Test looks for the following sensitivities:      Follow-up in 4-6 months or sooner if needed

## 2021-08-06 ENCOUNTER — Ambulatory Visit
Admission: RE | Admit: 2021-08-06 | Discharge: 2021-08-06 | Disposition: A | Discharge status: Home / Self Care | Payer: 59 | Source: Ambulatory Visit | Attending: Emergency Medicine | Admitting: Emergency Medicine

## 2021-08-06 VITALS — BP 110/72 | HR 80 | Temp 98.7°F | Resp 20

## 2021-08-06 DIAGNOSIS — J029 Acute pharyngitis, unspecified: Secondary | ICD-10-CM | POA: Diagnosis not present

## 2021-08-06 LAB — POCT RAPID STREP A (OFFICE): Rapid Strep A Screen: NEGATIVE

## 2021-08-06 NOTE — Discharge Instructions (Signed)
Continue using your ryaltris daily as prescribed. Consider using saline spray or saline irrigation such as with a neti pot. This should help relieve your ear pain.   If you try using saline irrigation, such as with a neti pot, you can use it several times a day while you are sick but make sure to do it at different times than the ryaltris so you don't wash the medicine out. Many neti pots come with salt packets premeasured to use to make saline. If you use your own salt, make sure it is kosher salt or sea salt (don't use table salt as it has iodine in it and you don't need that in your nose). Use distilled water to make saline. If you mix your own saline using your own salt, the recipe is 1/4 teaspoon salt in 1 cup warm water.   You can continue using tylenol and ibuprofen for pain. Try gargling with salt water or using throat lozenges to help your throat pain.   You will get a call if your throat culture is positive, you will not get a call if test is negative but you can check results in MyChart if you have a MyChart account.

## 2021-08-06 NOTE — ED Triage Notes (Signed)
Pt here with sore throat starting 5 days ago.

## 2021-08-06 NOTE — ED Provider Notes (Signed)
[facility]    CSN: [id number] Arrival date & time: 08/06/21  3785      History   Chief Complaint Chief Complaint  Patient presents with   Sore Throat    Entered by patient    HPI Cynthia Frazier is a 31 y.o. female. Reports sore throat for 5 days. Had fever 1st 2 days, Tmax 101.76F, none in last few days but has been taking tylenol and ibuprofen for pain/fever. Also reports R ear pain.  S/p tonsillectomy. Denies congestion, headache, myalgias, N/V/D, cough. Has tested self for covid x2 at home, once on 08/03/21 and once yesterday; negative results both times.    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      Home Medications    Prior to Admission medications   Medication Sig Start Date End Date Taking? Authorizing Provider  busPIRone (BUSPAR) 15 MG tablet Take 20 mg by mouth 3 (three) times daily.    [provider]  ciclopirox (PENLAC) 8 % solution Apply topically at bedtime. Apply over nail and surrounding skin. Apply daily over previous coat. After seven (7) days, may remove with alcohol and continue cycle. 06/04/21   Jake Shark, PA-C  clonazePAM (KLONOPIN) 0.5 MG tablet Take 0.5 mg by mouth daily as needed. Patient not taking: Reported on 07/18/2021 04/10/20   [provider]  diphenhydrAMINE HCl (BENADRYL ALLERGY PO) Take by mouth.    [provider]   ergocalciferol (VITAMIN D2) 1.25 MG (50000 UT) capsule  12/23/19   [provider]  fexofenadine (ALLEGRA ALLERGY) 180 MG tablet Take 1 tablet (180 mg total) by mouth daily. 07/18/21   Marcelyn Bruins, MD  montelukast (SINGULAIR) 10 MG tablet Take 1 tablet (10 mg total) by mouth at bedtime. 07/18/21   Marcelyn Bruins, MD  Olopatadine HCl 0.2 % SOLN Apply 1 drop to eye 2 (two) times daily. 07/18/21   Marcelyn Bruins, MD  Olopatadine-Mometasone [facility]) 4300377906 MCG/ACT SUSP Place 2 sprays into the nose 2 (two) times daily. 07/18/21   Marcelyn Bruins, MD  prazosin (MINIPRESS) 1 MG capsule Take 1 capsule (1 mg total) by mouth at bedtime. 05/22/21   Tysinger, Kermit Balo, PA-C  sertraline (ZOLOFT) 100 MG tablet Take 100 mg by mouth daily.    [provider]    Family History Family History  Adopted: Yes    Social History Social History   Tobacco Use   Smoking status: Never   Smokeless tobacco: Never  Vaping Use   Vaping Use: Never used  Substance Use Topics   Alcohol use: Yes    Comment: 2 times/week   Drug use: Never     Allergies   Sumatriptan, Vancomycin, Latex, and Levofloxacin   Review of Systems Review of Systems   Physical Exam Triage Vital Signs  ED Triage Vitals  Enc Vitals Group     BP 08/06/21 0844 110/72     Pulse Rate 08/06/21 0844 80     Resp 08/06/21 0844 20     Temp 08/06/21 0844 98.7 F (37.1 C)     Temp src --      SpO2 08/06/21 0844 95 %     Weight --      Height --      Head Circumference --      Peak Flow --      Pain Score 08/06/21 0845 7     Pain Loc --      Pain Edu? --      Excl. in GC? --    No data found.  Updated Vital Signs BP 110/72   Pulse 80   Temp 98.7 F (37.1 C)   Resp 20   SpO2 95%   Visual Acuity Right Eye Distance:   Left Eye Distance:   Bilateral Distance:    Right Eye Near:   Left Eye Near:    Bilateral Near:     Physical Exam Constitutional:       General: She is not in acute distress.    Appearance: She is well-developed. She is not ill-appearing.  HENT:     Right Ear: Ear canal and external ear normal. Tympanic membrane is retracted.     Left Ear: Tympanic membrane, ear canal and external ear normal.     Mouth/Throat:     Mouth: Mucous membranes are moist.     Pharynx: Oropharynx is clear.     Comments: Tonsils absent Cardiovascular:     Rate and Rhythm: Normal rate and regular rhythm.  Pulmonary:     Effort: Pulmonary effort is normal.     Breath sounds: Normal breath sounds.  Lymphadenopathy:     Head:     Right side of head: No submandibular adenopathy.     Left side of head: No submandibular adenopathy.  Neurological:     Mental Status: She is alert.     UC Treatments / Results  Labs (all labs ordered are listed, but only abnormal results are displayed) Labs Reviewed  CULTURE, GROUP A STREP [facility])  POCT RAPID STREP A (OFFICE)    EKG   Radiology No results found.  Procedures Procedures (including critical care time)  Medications Ordered in UC Medications - No data to display  Initial Impression / Assessment and Plan / UC Course  I have reviewed the triage vital signs and the nursing notes.  Pertinent labs & imaging results that were available during my care of the patient were reviewed by me and considered in my medical decision making (see chart for details).    No signs of ear infection but R TM is retracted. Pt using ryaltris. Discussed use of saline irrigation to relieve congestion/relieve pressure on R TM. Rapid strep negative, throat culture pending. REviewed suppportive care measures.   Final Clinical Impressions(s) / UC Diagnoses   Final diagnoses:  Acute pharyngitis, unspecified etiology     Discharge Instructions      Continue using your ryaltris daily as prescribed. Consider using saline spray or saline irrigation such as with a neti pot. This should help relieve your ear pain.    If you try using saline irrigation, such as with a neti pot, you can use it several times a day while you are sick but make sure to do it at different times than the ryaltris so you  don't wash the medicine out. Many neti pots come with salt packets premeasured to use to make saline. If you use your own salt, make sure it is kosher salt or sea salt (don't use table salt as it has iodine in it and you don't need that in your nose). Use distilled water to make saline. If you mix your own saline using your own salt, the recipe is 1/4 teaspoon salt in 1 cup warm water.   You can continue using tylenol and ibuprofen for pain. Try gargling with salt water or using throat lozenges to help your throat pain.   You will get a call if your throat culture is positive, you will not get a call if test is negative but you can check results in MyChart if you have a MyChart account.       ED Prescriptions   None    PDMP not reviewed this encounter.   Cathlyn Parsons, NP 08/06/21 [phone]

## 2021-08-08 ENCOUNTER — Telehealth (HOSPITAL_COMMUNITY): Payer: Self-pay | Admitting: Emergency Medicine

## 2021-08-08 LAB — CULTURE, GROUP A STREP (THRC)

## 2021-08-08 MED ORDER — AMOXICILLIN 500 MG PO CAPS
500.0000 mg | ORAL_CAPSULE | Freq: Two times a day (BID) | ORAL | 0 refills | Status: AC
Start: 2021-08-08 — End: 2021-08-18

## 2021-09-04 ENCOUNTER — Ambulatory Visit: Payer: 59 | Admitting: Dietician

## 2021-09-05 ENCOUNTER — Other Ambulatory Visit: Payer: Self-pay | Admitting: Physician Assistant

## 2021-09-05 MED ORDER — SERTRALINE HCL 100 MG PO TABS: 100.0000 mg | ORAL_TABLET | Freq: Every day | ORAL | 0 refills | Status: DC

## 2021-09-05 MED ORDER — LAMOTRIGINE 25 MG PO TABS: 50.0000 mg | ORAL_TABLET | Freq: Every day | ORAL | 0 refills | Status: DC

## 2021-09-05 MED ORDER — TRAZODONE HCL 150 MG PO TABS: 150.0000 mg | ORAL_TABLET | Freq: Every day | ORAL | 0 refills | Status: DC

## 2021-09-05 MED ORDER — PRAZOSIN HCL 5 MG PO CAPS: 5.0000 mg | ORAL_CAPSULE | Freq: Every day | ORAL | 0 refills | Status: DC

## 2021-09-09 ENCOUNTER — Ambulatory Visit
Admission: RE | Admit: 2021-09-09 | Discharge: 2021-09-09 | Disposition: A | Discharge status: Home / Self Care | Payer: 59 | Source: Ambulatory Visit | Attending: Emergency Medicine | Admitting: Emergency Medicine

## 2021-09-09 VITALS — BP 106/65 | HR 64 | Temp 98.9°F | Resp 18

## 2021-09-09 DIAGNOSIS — L089 Local infection of the skin and subcutaneous tissue, unspecified: Secondary | ICD-10-CM

## 2021-09-09 DIAGNOSIS — S90821A Blister (nonthermal), right foot, initial encounter: Secondary | ICD-10-CM

## 2021-09-09 DIAGNOSIS — L301 Dyshidrosis [pompholyx]: Secondary | ICD-10-CM

## 2021-09-09 DIAGNOSIS — S90822A Blister (nonthermal), left foot, initial encounter: Secondary | ICD-10-CM | POA: Diagnosis not present

## 2021-09-09 MED ORDER — TRIAMCINOLONE ACETONIDE 0.5 % EX CREA: 1.0000 | TOPICAL_CREAM | Freq: Two times a day (BID) | CUTANEOUS | 0 refills | Status: DC

## 2021-09-09 MED ORDER — SULFAMETHOXAZOLE-TRIMETHOPRIM 800-160 MG PO TABS
1.0000 | ORAL_TABLET | Freq: Two times a day (BID) | ORAL | 0 refills | Status: AC
Start: 2021-09-09 — End: 2021-09-14

## 2021-09-09 MED ORDER — KETOCONAZOLE 2 % EX CREA: TOPICAL_CREAM | CUTANEOUS | 1 refills | Status: DC

## 2021-09-09 MED ORDER — FLUCONAZOLE 150 MG PO TABS: ORAL_TABLET | ORAL | 0 refills | Status: DC

## 2021-09-09 NOTE — Discharge Instructions (Addendum)
To correct possible bacterial infection in the blisters on your feet, please begin Bactrim 1 tablet twice daily for the next 5 days.  Because we know the antibiotics often cause vaginal yeast infections in female patients, I have also provided you with a prescription for Diflucan.  Please take the first tablet on day 3 of Bactrim and take the second tablet 3 days after the first tablet.  For your dyshidrotic eczema, I have renewed your prescription for triamcinolone cream and ketoconazole cream at a stronger dose than the over-the-counter Lotrimin.  I also recommend that you consider switching from Aquaphor to Eucerin original cream or Desitin to apply after you have applied your mixture of antibiotic ointment, triamcinolone and ketoconazole.  You may also consider wrapping your feet in a food grade plastic wrap at bedtime, cover with socks to keep them from making a crinkly noise.  I am glad that you are working on getting an appointment with a dermatologist.  This chronic condition is manageable with the right provider.  Please feel free to follow-up with [place] if you do not see meaningful improvement after finishing 5 days of antibiotics.  Thank you for visiting urgent care today.

## 2021-09-09 NOTE — ED Triage Notes (Signed)
Pt c/o blisters begin on the bottom of both feet, she states the right is worse (making it harder for her to ambulate).  Started: a week ago  Home interventions: triamcinolone, Aquaphor, lotrimin

## 2021-09-09 NOTE — ED Provider Notes (Signed)
[facility]    CSN: [id number] Arrival date & time: 09/09/21  [id number]    HISTORY   Chief Complaint  Patient presents with   Foot Pain    Entered by patient   Blister   HPI Cynthia Frazier is a 31 y.o. female. Patient reports a history of dyshidrotic eczema on the soles of her feet and states occasionally blisters will burst as they have done recently.  Patient states usually she can tolerate this but for some reason this particular episode is much more painful.  Patient states her feet are also more red, right greater than left.  Patient states her usual regimen is to apply triamcinolone and Aquaphor to her feet, states pharmacist advised her to add Lotrimin because triamcinolone can cause fungal infections which she has been doing.  Patient states she does not have similar rash anywhere else on her body.  Patient denies fever, red streaking up her legs, increased swelling lower extremities.  The history is provided by the patient.   Past Medical History:  Diagnosis Date   Anxiety    Depression    Dysphagia    Left upper quadrant abdominal pain 07/11/2020   Nausea and vomiting 07/11/2020   Throat pain    Vitamin D deficiency    Patient Active Problem List   Diagnosis Date Noted   Vitamin D deficiency 06/11/2021   Asthma 06/04/2021   Obesity, morbid, BMI 40.0-49.9 ([place]) 06/04/2021   Onychomycosis 06/04/2021   Eczema, dyshidrotic 06/04/2021   S/P cholecystectomy 07/11/2020   Past Surgical History:  Procedure Laterality Date   CESAREAN SECTION     CHOLECYSTECTOMY     OB History   No obstetric history on file.    Home Medications    Prior to Admission medications   Medication Sig Start Date End Date Taking? Authorizing Provider  fluconazole (DIFLUCAN) 150 MG tablet Take 1 tablet on day 3 of antibiotics.  Take second tablet 3 days later. 09/09/21  Yes Lynden Oxford Scales, PA-C  ketoconazole (NIZORAL) 2 % cream Apply to affected areas in the 1/2 inch area  surrounding the affected area once daily.  Once rash is completely resolved, continue applying to previously affected area for another 7 days. 09/09/21  Yes Lynden Oxford Scales, PA-C  sulfamethoxazole-trimethoprim (BACTRIM DS) 800-160 MG tablet Take 1 tablet by mouth 2 (two) times daily for 5 days. 09/09/21 09/14/21 Yes Lynden Oxford Scales, PA-C  triamcinolone cream (KENALOG) 0.5 % Apply 1 Application topically 2 (two) times daily. Apply to affected area(s) twice daily , do not apply to face. 09/09/21  Yes Lynden Oxford Scales, PA-C  busPIRone (BUSPAR) 15 MG tablet Take 20 mg by mouth 3 (three) times daily.    [provider]  ciclopirox (PENLAC) 8 % solution Apply topically at bedtime. Apply over nail and surrounding skin. Apply daily over previous coat. After seven (7) days, may remove with alcohol and continue cycle. 06/04/21   Francis Gaines B, PA-C  diphenhydrAMINE HCl (BENADRYL ALLERGY PO) Take by mouth.    [provider]  ergocalciferol (VITAMIN D2) 1.25 MG (50000 UT) capsule  12/23/19   [provider]  fexofenadine (ALLEGRA ALLERGY) 180 MG tablet Take 1 tablet (180 mg total) by mouth daily. 07/18/21   Kennith Gain, MD  lamoTRIgine (LAMICTAL) 25 MG tablet Take 2 tablets (50 mg total) by mouth daily. 09/05/21   Francis Gaines B, PA-C  montelukast (SINGULAIR) 10 MG tablet Take 1 tablet (10 mg total) by mouth at bedtime.  07/18/21   Marcelyn Bruins, MD  Olopatadine HCl 0.2 % SOLN Apply 1 drop to eye 2 (two) times daily. 07/18/21   Marcelyn Bruins, MD  Olopatadine-Mometasone Cristal Generous) [phone] MCG/ACT SUSP Place 2 sprays into the nose 2 (two) times daily. 07/18/21   Marcelyn Bruins, MD  prazosin (MINIPRESS) 1 MG capsule Take 1 capsule (1 mg total) by mouth at bedtime. 05/22/21   Tysinger, Kermit Balo, PA-C  prazosin (MINIPRESS) 5 MG capsule Take 1 capsule (5 mg total) by mouth at bedtime. 09/05/21   Jake Shark, PA-C  sertraline  (ZOLOFT) 100 MG tablet Take 1 tablet (100 mg total) by mouth daily. 09/05/21   Jake Shark, PA-C  traZODone (DESYREL) 150 MG tablet Take 1 tablet (150 mg total) by mouth at bedtime. 09/05/21   Jake Shark, PA-C    Family History Family History  Adopted: Yes   Social History Social History   Tobacco Use   Smoking status: Never   Smokeless tobacco: Never  Vaping Use   Vaping Use: Never used  Substance Use Topics   Alcohol use: Yes    Comment: 2 times/week   Drug use: Never   Allergies   Sumatriptan, Vancomycin, Latex, and Levofloxacin  Review of Systems Review of Systems Pertinent findings noted in history of present illness.   Physical Exam Triage Vital Signs ED Triage Vitals  Enc Vitals Group     BP 12/28/20 0827 (!) 147/82     Pulse Rate 12/28/20 0827 72     Resp 12/28/20 0827 18     Temp 12/28/20 0827 98.3 F (36.8 C)     Temp Source 12/28/20 0827 Oral     SpO2 12/28/20 0827 98 %     Weight --      Height --      Head Circumference --      Peak Flow --      Pain Score 12/28/20 0826 5     Pain Loc --      Pain Edu? --      Excl. in GC? --   No data found.  Updated Vital Signs BP 106/65 (BP Location: Left Arm)   Pulse 64   Temp 98.9 F (37.2 C)   Resp 18   SpO2 96%   Physical Exam Vitals and nursing note reviewed.  Constitutional:      General: She is not in acute distress.    Appearance: Normal appearance. She is obese. She is not ill-appearing.  HENT:     Head: Normocephalic and atraumatic.  Eyes:     Pupils: Pupils are equal, round, and reactive to light.  Cardiovascular:     Rate and Rhythm: Normal rate and regular rhythm.  Pulmonary:     Effort: Pulmonary effort is normal.     Breath sounds: Normal breath sounds.  Musculoskeletal:        General: Normal range of motion.     Cervical back: Normal range of motion and neck supple.  Skin:    General: Skin is warm and dry.     Findings: Rash (TNTC deroofed blisters on medial aspect  of the soles of bilateral feet, sole of right foot is mildly erythematous and tender to palpation around area of blisters) present.  Neurological:     General: No focal deficit present.     Mental Status: She is alert and oriented to person, place, and time. Mental status is at baseline.  Psychiatric:  Mood and Affect: Mood normal.        Behavior: Behavior normal.        Thought Content: Thought content normal.        Judgment: Judgment normal.     Visual Acuity Right Eye Distance:   Left Eye Distance:   Bilateral Distance:    Right Eye Near:   Left Eye Near:    Bilateral Near:     UC Couse / Diagnostics / Procedures:    EKG  Radiology No results found.  Procedures Procedures (including critical care time)  UC Diagnoses / Final Clinical Impressions(s)   I have reviewed the triage vital signs and the nursing notes.  Pertinent labs & imaging results that were available during my care of the patient were reviewed by me and considered in my medical decision making (see chart for details).    Final diagnoses:  Dyshidrotic eczema  Blister of foot with infection, left, initial encounter  Blister of foot with infection, right, initial encounter   Patient states she is currently waiting on an initial appointment with a dermatologist.  Patient advised that I have renewed her triamcinolone and provide her with a prescription dose of the ketoconazole as it stronger than the over-the-counter version.  Patient also advised to apply antibiotic ointment to blisters prior to applying the ketoconazole and triamcinolone cream.  Patient provided with a prescription for Bactrim for likely soft tissue infection both feet.  Patient further advised that I recommend stronger moisture barrier than Aquaphor such as Eucerin.  Patient advised she can consider wrapping her feet in Saran wrap at bedtime for the next few nights.  Return precautions advised.  ED Prescriptions     Medication Sig  Dispense Auth. Provider   triamcinolone cream (KENALOG) 0.5 % Apply 1 Application topically 2 (two) times daily. Apply to affected area(s) twice daily , do not apply to face. 80 g Lynden Oxford Scales, PA-C   fluconazole (DIFLUCAN) 150 MG tablet Take 1 tablet on day 3 of antibiotics.  Take second tablet 3 days later. 2 tablet Lynden Oxford Scales, PA-C   ketoconazole (NIZORAL) 2 % cream Apply to affected areas in the 1/2 inch area surrounding the affected area once daily.  Once rash is completely resolved, continue applying to previously affected area for another 7 days. 60 g Lynden Oxford Scales, PA-C   sulfamethoxazole-trimethoprim (BACTRIM DS) 800-160 MG tablet Take 1 tablet by mouth 2 (two) times daily for 5 days. 10 tablet Lynden Oxford Scales, PA-C      PDMP not reviewed this encounter.  Pending results:  Labs Reviewed - No data to display  Medications Ordered in UC: Medications - No data to display  Disposition Upon Discharge:  Condition: stable for discharge home Home: take medications as prescribed; routine discharge instructions as discussed; follow up as advised.  Patient presented with an acute illness with associated systemic symptoms and significant discomfort requiring urgent management. In my opinion, this is a condition that a prudent lay person (someone who possesses an average knowledge of health and medicine) may potentially expect to result in complications if not addressed urgently such as respiratory distress, impairment of bodily function or dysfunction of bodily organs.   Routine symptom specific, illness specific and/or disease specific instructions were discussed with the patient and/or caregiver at length.   As such, the patient has been evaluated and assessed, work-up was performed and treatment was provided in alignment with urgent care protocols and evidence based medicine.  Patient/parent/caregiver has been  advised that the patient may require follow up  for further testing and treatment if the symptoms continue in spite of treatment, as clinically indicated and appropriate.  Patient/parent/caregiver has been advised to return to the [facility] or PCP if no better; to PCP or the Emergency Department if new signs and symptoms develop, or if the current signs or symptoms continue to change or worsen for further workup, evaluation and treatment as clinically indicated and appropriate  The patient will follow up with their current PCP if and as advised. If the patient does not currently have a PCP we will assist them in obtaining one.   The patient may need specialty follow up if the symptoms continue, in spite of conservative treatment and management, for further workup, evaluation, consultation and treatment as clinically indicated and appropriate.   Patient/parent/caregiver verbalized understanding and agreement of plan as discussed.  All questions were addressed during visit.  Please see discharge instructions below for further details of plan.  Discharge Instructions:   Discharge Instructions      To correct possible bacterial infection in the blisters on your feet, please begin Bactrim 1 tablet twice daily for the next 5 days.  Because we know the antibiotics often cause vaginal yeast infections in female patients, I have also provided you with a prescription for Diflucan.  Please take the first tablet on day 3 of Bactrim and take the second tablet 3 days after the first tablet.  For your dyshidrotic eczema, I have renewed your prescription for triamcinolone cream and ketoconazole cream at a stronger dose than the over-the-counter Lotrimin.  I also recommend that you consider switching from Aquaphor to Eucerin original cream or Desitin to apply after you have applied your mixture of antibiotic ointment, triamcinolone and ketoconazole.  You may also consider wrapping your feet in a food grade plastic wrap at bedtime, cover with socks to keep them from  making a crinkly noise.  I am glad that you are working on getting an appointment with a dermatologist.  This chronic condition is manageable with the right provider.  Please feel free to follow-up with [place] if you do not see meaningful improvement after finishing 5 days of antibiotics.  Thank you for visiting urgent care today.      This office note has been dictated using Teaching laboratory technician.  Unfortunately, and despite my best efforts, this method of dictation can sometimes lead to occasional typographical or grammatical errors.  I apologize in advance if this occurs.     Theadora Rama Scales, [place] 09/09/21 [phone]

## 2021-09-17 ENCOUNTER — Encounter: Payer: Self-pay | Admitting: Emergency Medicine

## 2021-09-17 ENCOUNTER — Ambulatory Visit
Admission: EM | Admit: 2021-09-17 | Discharge: 2021-09-17 | Disposition: A | Discharge status: Home / Self Care | Payer: 59 | Attending: Student | Admitting: Student

## 2021-09-17 DIAGNOSIS — R000 Tachycardia, unspecified: Secondary | ICD-10-CM

## 2021-09-17 DIAGNOSIS — Z86718 Personal history of other venous thrombosis and embolism: Secondary | ICD-10-CM

## 2021-09-17 DIAGNOSIS — Z9851 Tubal ligation status: Secondary | ICD-10-CM

## 2021-09-17 DIAGNOSIS — Z8759 Personal history of other complications of pregnancy, childbirth and the puerperium: Secondary | ICD-10-CM

## 2021-09-17 DIAGNOSIS — Z975 Presence of (intrauterine) contraceptive device: Secondary | ICD-10-CM

## 2021-09-17 LAB — POCT FASTING CBG KUC MANUAL ENTRY: POCT Glucose (KUC): 121 mg/dL — AB (ref 70–99)

## 2021-09-17 NOTE — ED Triage Notes (Signed)
Pt here with "heart racing" and dizziness since yesterday. She states this only happened when she was pregnant with her 33 year old.

## 2021-09-17 NOTE — Discharge Instructions (Addendum)
-  It's a good sign that your heart is slowing down and you're feeling better. However I cannot rule out a DVT or PE (blood clot) in the urgent care. If you do have a blood clot, this can be life threatening - it can stop your heart. The safest option is to head to the ED. If you don't, keep an eye on your pulse, and if this goes above 100 again, head to the ED or call 911. Also head to the ED if new chest pain, worsening shortness of breath, new swelling in your legs, etc.

## 2021-09-17 NOTE — ED Provider Notes (Signed)
[facility]    CSN: [id number] Arrival date & time: 09/17/21  1404      History   Chief Complaint Chief Complaint  Patient presents with   Tachycardia   Dizziness    HPI Cynthia Frazier is a 31 y.o. female presenting with tachycardia and dizziness for 1 day.  History of DVT in pregnancy, no further DVT or PE.  She does not take long-term anticoagulation.  She describes constant dizziness like the room is spinning, worse with different positions. No ear pain.  Sensation of heart racing.  This is not associated with shortness of breath or chest pain.  She wears compression hose daily, and denies pedal edema. IUD contraception and her tubes are tied. Denies recent travel, prolonged immobilization, recent surgery, recent trauma, OCP use, history of PE, cigarette smoking. Denies recent URI.  HPI  Past Medical History:  Diagnosis Date   Anxiety    Depression    Dysphagia    Left upper quadrant abdominal pain 07/11/2020   Nausea and vomiting 07/11/2020   Throat pain    Vitamin D deficiency     Patient Active Problem List   Diagnosis Date Noted   Vitamin D deficiency 06/11/2021   Asthma 06/04/2021   Obesity, morbid, BMI 40.0-49.9 ([facility]) 06/04/2021   Onychomycosis 06/04/2021   Eczema, dyshidrotic 06/04/2021   S/P cholecystectomy 07/11/2020    Past Surgical History:  Procedure Laterality Date   CESAREAN SECTION     CHOLECYSTECTOMY      OB History   No obstetric history on file.      Home Medications    Prior to Admission medications   Medication Sig Start Date End Date Taking? Authorizing Provider  busPIRone (BUSPAR) 15 MG tablet Take 20 mg by mouth 3 (three) times daily.    [provider]  ciclopirox (PENLAC) 8 % solution Apply topically at bedtime. Apply over nail and surrounding skin. Apply daily over previous coat. After seven (7) days, may remove with alcohol and continue cycle. 06/04/21   Burnard Hawthorne B, PA-C  diphenhydrAMINE HCl (BENADRYL  ALLERGY PO) Take by mouth.    [provider]  ergocalciferol (VITAMIN D2) 1.25 MG (50000 UT) capsule  12/23/19   [provider]  fexofenadine (ALLEGRA ALLERGY) 180 MG tablet Take 1 tablet (180 mg total) by mouth daily. 07/18/21   Marcelyn Bruins, MD  fluconazole (DIFLUCAN) 150 MG tablet Take 1 tablet on day 3 of antibiotics.  Take second tablet 3 days later. 09/09/21   Theadora Rama Scales, PA-C  ketoconazole (NIZORAL) 2 % cream Apply to affected areas in the 1/2 inch area surrounding the affected area once daily.  Once rash is completely resolved, continue applying to previously affected area for another 7 days. 09/09/21   Theadora Rama Scales, PA-C  lamoTRIgine (LAMICTAL) 25 MG tablet Take 2 tablets (50 mg total) by mouth daily. 09/05/21   Burnard Hawthorne B, PA-C  montelukast (SINGULAIR) 10 MG tablet Take 1 tablet (10 mg total) by mouth at bedtime. 07/18/21   Marcelyn Bruins, MD  Olopatadine HCl 0.2 % SOLN Apply 1 drop to eye 2 (two) times daily. 07/18/21   Marcelyn Bruins, MD  Olopatadine-Mometasone Cristal Generous) [phone] MCG/ACT SUSP Place 2 sprays into the nose 2 (two) times daily. 07/18/21   Marcelyn Bruins, MD  prazosin (MINIPRESS) 1 MG capsule Take 1 capsule (1 mg total) by mouth at bedtime. 05/22/21   Tysinger, Kermit Balo, PA-C  prazosin (MINIPRESS) 5 MG capsule Take 1  capsule (5 mg total) by mouth at bedtime. 09/05/21   Jake Shark, PA-C  sertraline (ZOLOFT) 100 MG tablet Take 1 tablet (100 mg total) by mouth daily. 09/05/21   Jake Shark, PA-C  traZODone (DESYREL) 150 MG tablet Take 1 tablet (150 mg total) by mouth at bedtime. 09/05/21   Burnard Hawthorne B, PA-C  triamcinolone cream (KENALOG) 0.5 % Apply 1 Application topically 2 (two) times daily. Apply to affected area(s) twice daily , do not apply to face. 09/09/21   Theadora Rama Scales, PA-C    Family History Family History  Adopted: Yes    Social History Social History    Tobacco Use   Smoking status: Never   Smokeless tobacco: Never  Vaping Use   Vaping Use: Never used  Substance Use Topics   Alcohol use: Yes    Comment: 2 times/week   Drug use: Never     Allergies   Sumatriptan, Vancomycin, Latex, and Levofloxacin   Review of Systems Review of Systems  Neurological:  Positive for dizziness.  All other systems reviewed and are negative.    Physical Exam Triage Vital Signs ED Triage Vitals  Enc Vitals Group     BP 09/17/21 1505 116/64     Pulse Rate 09/17/21 1505 (!) 134     Resp 09/17/21 1505 20     Temp 09/17/21 1505 98 F (36.7 C)     Temp src --      SpO2 09/17/21 1505 98 %     Weight --      Height --      Head Circumference --      Peak Flow --      Pain Score 09/17/21 1507 0     Pain Loc --      Pain Edu? --      Excl. in GC? --    No data found.  Updated Vital Signs BP 116/64   Pulse 96   Temp 98 F (36.7 C)   Resp 20   SpO2 98%   Visual Acuity Right Eye Distance:   Left Eye Distance:   Bilateral Distance:    Right Eye Near:   Left Eye Near:    Bilateral Near:     Physical Exam Vitals reviewed.  Constitutional:      Appearance: Normal appearance. She is not diaphoretic.  HENT:     Head: Normocephalic and atraumatic.     Mouth/Throat:     Mouth: Mucous membranes are moist.  Eyes:     Extraocular Movements: Extraocular movements intact.     Pupils: Pupils are equal, round, and reactive to light.  Cardiovascular:     Rate and Rhythm: Normal rate and regular rhythm.     Pulses:          Radial pulses are 2+ on the right side and 2+ on the left side.     Heart sounds: Normal heart sounds.     Comments: Compression hose in place Calves are equal and symmetric. No calf pain. Negative homan sign bilaterally. Pulmonary:     Effort: Pulmonary effort is normal.     Breath sounds: Normal breath sounds.  Abdominal:     Palpations: Abdomen is soft.     Tenderness: There is no abdominal tenderness.  There is no guarding or rebound.  Musculoskeletal:     Right lower leg: No edema.     Left lower leg: No edema.  Skin:    General: Skin is warm.  Capillary Refill: Capillary refill takes less than 2 seconds.  Neurological:     General: No focal deficit present.     Mental Status: She is alert and oriented to person, place, and time.  Psychiatric:        Mood and Affect: Mood normal.        Behavior: Behavior normal.        Thought Content: Thought content normal.        Judgment: Judgment normal.      UC Treatments / Results  Labs (all labs ordered are listed, but only abnormal results are displayed) Labs Reviewed  POCT FASTING CBG [facility] MANUAL ENTRY - Abnormal; Notable for the following components:      Result Value   POCT Glucose ([facility]) 121 (*)    All other components within normal limits    EKG   Radiology No results found.  Procedures Procedures (including critical care time)  Medications Ordered in UC Medications - No data to display  Initial Impression / Assessment and Plan / UC Course  I have reviewed the triage vital signs and the nursing notes.  Pertinent labs & imaging results that were available during my care of the patient were reviewed by me and considered in my medical decision making (see chart for details).     This patient is a very pleasant 31 y.o. year old female presenting with tachycardia and lightheadedness.  On triage, she was tachycardic at 130, however this completely resolved throughout the course of the visit, with pulse reducing to 91-96.  She also initially endorsed dizziness, worse with movement, which also improved throughout the visit.  She does endorse some anxiety and stress when the symptoms began.  She has a history of one DVT during pregnancy, but currently does not have risk factors for this; she is a non-smoker, does not take OCP or HRT, no recent travel or prolonged immobilization, no recent surgery, she wears compression hose  and does not have any calf swelling today, she does not have chest pain or shortness of breath. EKG sinus tachy but NSR. No prior study for comparison. CBG 121.  Discussed differential, which includes DVT/PE versus anxiety versus other.  As she is feeling well, she verbalizes that she will go home, but she will monitor her pulse at home, and if she develops symptoms again or tachycardia she will head straight to the emergency department.  She will also call her primary care provider and ask for a follow-up in 1 day.  She does understand that if she has a DVT or PE, this could cause a cardiac arrest and kill her.  Final Clinical Impressions(s) / UC Diagnoses   Final diagnoses:  Tachycardia  IUD contraception  History of tubal ligation  History of deep vein thrombosis (DVT) during pregnancy     Discharge Instructions      -It's a good sign that your heart is slowing down and you're feeling better. However I cannot rule out a DVT or PE (blood clot) in the urgent care. If you do have a blood clot, this can be life threatening - it can stop your heart. The safest option is to head to the ED. If you don't, keep an eye on your pulse, and if this goes above 100 again, head to the ED or call 911. Also head to the ED if new chest pain, worsening shortness of breath, new swelling in your legs, etc.      ED Prescriptions   None  PDMP not reviewed this encounter.   Rhys Martini, PA-C 09/17/21 1604

## 2021-10-02 DIAGNOSIS — R69 Illness, unspecified: Secondary | ICD-10-CM | POA: Diagnosis not present

## 2021-10-13 ENCOUNTER — Ambulatory Visit: Admit: 2021-10-13 | Payer: 59 | Source: Home / Self Care

## 2021-10-13 ENCOUNTER — Ambulatory Visit (HOSPITAL_COMMUNITY): Payer: Self-pay

## 2021-10-13 ENCOUNTER — Ambulatory Visit (INDEPENDENT_AMBULATORY_CARE_PROVIDER_SITE_OTHER): Payer: 59

## 2021-10-13 ENCOUNTER — Ambulatory Visit (HOSPITAL_COMMUNITY)
Admission: EM | Admit: 2021-10-13 | Discharge: 2021-10-13 | Disposition: A | Discharge status: Home / Self Care | Payer: 59

## 2021-10-13 DIAGNOSIS — M79671 Pain in right foot: Secondary | ICD-10-CM | POA: Diagnosis not present

## 2021-10-13 DIAGNOSIS — S9031XA Contusion of right foot, initial encounter: Secondary | ICD-10-CM

## 2021-10-13 DIAGNOSIS — S99921A Unspecified injury of right foot, initial encounter: Secondary | ICD-10-CM | POA: Diagnosis not present

## 2021-10-13 NOTE — Discharge Instructions (Signed)
X-ray was normal.  Suspect bruising to your foot. recommend elevation and ice application.  Follow-up if symptoms persist or worsen.

## 2021-10-13 NOTE — ED Provider Notes (Signed)
[facility]    CSN: [id number] Arrival date & time: 10/13/21  1541      History   Chief Complaint Chief Complaint  Patient presents with   Foot Injury    HPI Cynthia Frazier is a 31 y.o. female.   Patient presents with right foot pain after injury that occurred last night.  Patient reports that her husband was stuck in the recliner and she went to help him out when the bottom of the recliner slammed down onto her foot.  She is having pain in the lateral portion of the foot directly below fifth toe.  Denies any numbness or tingling.  She is able to bear weight.  Has taken ibuprofen with some improvement in pain.   Foot Injury   Past Medical History:  Diagnosis Date   Anxiety    Depression    Dysphagia    Left upper quadrant abdominal pain 07/11/2020   Nausea and vomiting 07/11/2020   Throat pain    Vitamin D deficiency     Patient Active Problem List   Diagnosis Date Noted   Vitamin D deficiency 06/11/2021   Asthma 06/04/2021   Obesity, morbid, BMI 40.0-49.9 (HCC) 06/04/2021   Onychomycosis 06/04/2021   Eczema, dyshidrotic 06/04/2021   S/P cholecystectomy 07/11/2020    Past Surgical History:  Procedure Laterality Date   CESAREAN SECTION     CHOLECYSTECTOMY      OB History   No obstetric history on file.      Home Medications    Prior to Admission medications   Medication Sig Start Date End Date Taking? Authorizing Provider  midodrine (PROAMATINE) 10 MG tablet Take 10 mg by mouth 3 (three) times daily.   Yes [provider]  busPIRone (BUSPAR) 15 MG tablet Take 20 mg by mouth 3 (three) times daily.    [provider]  ciclopirox (PENLAC) 8 % solution Apply topically at bedtime. Apply over nail and surrounding skin. Apply daily over previous coat. After seven (7) days, may remove with alcohol and continue cycle. 06/04/21   Burnard Hawthorne B, PA-C  diphenhydrAMINE HCl (BENADRYL ALLERGY PO) Take by mouth.    [provider]   ergocalciferol (VITAMIN D2) 1.25 MG (50000 UT) capsule  12/23/19   [provider]  fexofenadine (ALLEGRA ALLERGY) 180 MG tablet Take 1 tablet (180 mg total) by mouth daily. 07/18/21   Marcelyn Bruins, MD  fluconazole (DIFLUCAN) 150 MG tablet Take 1 tablet on day 3 of antibiotics.  Take second tablet 3 days later. 09/09/21   Theadora Rama Scales, PA-C  ketoconazole (NIZORAL) 2 % cream Apply to affected areas in the 1/2 inch area surrounding the affected area once daily.  Once rash is completely resolved, continue applying to previously affected area for another 7 days. 09/09/21   Theadora Rama Scales, PA-C  lamoTRIgine (LAMICTAL) 25 MG tablet Take 2 tablets (50 mg total) by mouth daily. 09/05/21   Burnard Hawthorne B, PA-C  montelukast (SINGULAIR) 10 MG tablet Take 1 tablet (10 mg total) by mouth at bedtime. 07/18/21   Marcelyn Bruins, MD  Olopatadine HCl 0.2 % SOLN Apply 1 drop to eye 2 (two) times daily. 07/18/21   Marcelyn Bruins, MD  Olopatadine-Mometasone Cristal Generous) [phone] MCG/ACT SUSP Place 2 sprays into the nose 2 (two) times daily. 07/18/21   Marcelyn Bruins, MD  prazosin (MINIPRESS) 1 MG capsule Take 1 capsule (1 mg total) by mouth at bedtime. 05/22/21   Tysinger, Kermit Balo, PA-C  prazosin (MINIPRESS) 5 MG capsule Take 1 capsule (5 mg total) by mouth at bedtime. 09/05/21   Jake Shark, PA-C  sertraline (ZOLOFT) 100 MG tablet Take 1 tablet (100 mg total) by mouth daily. 09/05/21   Jake Shark, PA-C  traZODone (DESYREL) 150 MG tablet Take 1 tablet (150 mg total) by mouth at bedtime. 09/05/21   Burnard Hawthorne B, PA-C  triamcinolone cream (KENALOG) 0.5 % Apply 1 Application topically 2 (two) times daily. Apply to affected area(s) twice daily , do not apply to face. 09/09/21   Theadora Rama Scales, PA-C    Family History Family History  Adopted: Yes    Social History Social History   Tobacco Use   Smoking status: Never   Smokeless  tobacco: Never  Vaping Use   Vaping Use: Never used  Substance Use Topics   Alcohol use: Yes    Comment: 2 times/week   Drug use: Never     Allergies   Sumatriptan, Vancomycin, Latex, and Levofloxacin   Review of Systems Review of Systems Per HPI  Physical Exam Triage Vital Signs ED Triage Vitals  Enc Vitals Group     BP 10/13/21 1708 123/83     Pulse Rate 10/13/21 1708 62     Resp 10/13/21 1708 18     Temp 10/13/21 1708 98.2 F (36.8 C)     Temp src --      SpO2 10/13/21 1708 98 %     Weight --      Height --      Head Circumference --      Peak Flow --      Pain Score 10/13/21 1706 4     Pain Loc --      Pain Edu? --      Excl. in GC? --    No data found.  Updated Vital Signs BP 123/83   Pulse 62   Temp 98.2 F (36.8 C)   Resp 18   SpO2 98%   Visual Acuity Right Eye Distance:   Left Eye Distance:   Bilateral Distance:    Right Eye Near:   Left Eye Near:    Bilateral Near:     Physical Exam Constitutional:      General: She is not in acute distress.    Appearance: Normal appearance. She is not toxic-appearing or diaphoretic.  HENT:     Head: Normocephalic and atraumatic.  Eyes:     Extraocular Movements: Extraocular movements intact.     Conjunctiva/sclera: Conjunctivae normal.  Pulmonary:     Effort: Pulmonary effort is normal.  Feet:     Comments: Tenderness to palpation to right fifth toe that extends into dorsal surface of foot directly below toe with mild bruising and swelling.  Mild tenderness to palpation across lateral portion of foot as well.  No tenderness to remainder of toes or other parts of the foot/ankle.  Neurovascular intact.  No lacerations or abrasions noted. Neurological:     General: No focal deficit present.     Mental Status: She is alert and oriented to person, place, and time. Mental status is at baseline.  Psychiatric:        Mood and Affect: Mood normal.        Behavior: Behavior normal.        Thought Content:  Thought content normal.        Judgment: Judgment normal.      UC Treatments / Results  Labs (all labs ordered  are listed, but only abnormal results are displayed) Labs Reviewed - No data to display  EKG   Radiology DG Foot Complete Right  Result Date: 10/13/2021 CLINICAL DATA:  Trauma, pain EXAM: RIGHT FOOT COMPLETE - 3+ VIEW COMPARISON:  None Available. FINDINGS: No fracture or dislocation is seen. Small bony spurs are seen in first metatarsophalangeal joint. Small plantar spur is seen in calcaneus. IMPRESSION: No recent fracture or dislocation is seen in right foot. Electronically Signed   By: Ernie Avena M.D.   On: 10/13/2021 17:56    Procedures Procedures (including critical care time)  Medications Ordered in UC Medications - No data to display  Initial Impression / Assessment and Plan / UC Course  I have reviewed the triage vital signs and the nursing notes.  Pertinent labs & imaging results that were available during my care of the patient were reviewed by me and considered in my medical decision making (see chart for details).     Right foot x-ray was negative for any acute bony abnormality.  Suspect contusion of foot given mechanism of injury.  Recommend elevation of extremity and ice application.  Discussed over-the-counter pain relievers as well.  Patient to follow-up with podiatrist at provided contact information if symptoms persist or worsen.  Patient verbalized understanding and was agreeable with plan. Final Clinical Impressions(s) / UC Diagnoses   Final diagnoses:  Contusion of right foot, initial encounter  Right foot pain     Discharge Instructions      X-ray was normal.  Suspect bruising to your foot. recommend elevation and ice application.  Follow-up if symptoms persist or worsen.     ED Prescriptions   None    PDMP not reviewed this encounter.   Gustavus Bryant, [place] 10/13/21 [id number]

## 2021-10-13 NOTE — ED Triage Notes (Signed)
Pt presents to [facility] with co of right foot injury last night when she was helping her so with the recliner and it slammed down on her foot. Pt repots pain at 5th toe knuckle.

## 2021-10-16 ENCOUNTER — Encounter: Payer: Self-pay | Admitting: Dietician

## 2021-10-16 NOTE — Progress Notes (Signed)
Have not heard back from patient to reschedule her missed appointment from 09/04/21. Sent notification to referring provider.

## 2021-10-23 DIAGNOSIS — R69 Illness, unspecified: Secondary | ICD-10-CM | POA: Diagnosis not present

## 2021-10-27 DIAGNOSIS — G4733 Obstructive sleep apnea (adult) (pediatric): Secondary | ICD-10-CM | POA: Diagnosis not present

## 2021-10-27 DIAGNOSIS — R400 Somnolence: Secondary | ICD-10-CM | POA: Diagnosis not present

## 2021-11-06 ENCOUNTER — Ambulatory Visit: Payer: Self-pay

## 2021-11-06 ENCOUNTER — Encounter: Payer: Self-pay | Admitting: Internal Medicine

## 2021-11-06 ENCOUNTER — Ambulatory Visit: Payer: 59 | Admitting: Medical

## 2021-11-06 VITALS — BP 120/80 | HR 64 | Temp 98.9°F | Wt 264.8 lb

## 2021-11-06 DIAGNOSIS — R112 Nausea with vomiting, unspecified: Secondary | ICD-10-CM | POA: Diagnosis not present

## 2021-11-06 DIAGNOSIS — Z79899 Other long term (current) drug therapy: Secondary | ICD-10-CM | POA: Diagnosis not present

## 2021-11-06 DIAGNOSIS — A09 Infectious gastroenteritis and colitis, unspecified: Secondary | ICD-10-CM | POA: Diagnosis not present

## 2021-11-06 MED ORDER — ONDANSETRON HCL 4 MG PO TABS: 4.0000 mg | ORAL_TABLET | Freq: Three times a day (TID) | ORAL | 0 refills | Status: DC | PRN

## 2021-11-06 MED ORDER — METRONIDAZOLE 500 MG PO TABS: 500.0000 mg | ORAL_TABLET | Freq: Three times a day (TID) | ORAL | 0 refills | Status: AC

## 2021-11-06 NOTE — Progress Notes (Signed)
Subjective:  Cynthia Frazier is a 31 y.o. female who presents for Chief Complaint  Patient presents with   Diarrhea    Diarrhea x 10 days, taking clinadymicin for 5 days that gave her diarrhea, since stopped taking it- now nauseated and vomiting     Here for diarrhea x 10 days.   Started Clindamycin for dental infection, used this 5 days, ending this past Saturday 4 days ago.  Diarrhea began within a few days of using the antibiotic.    Now having some nausea and vomiting as well x 3 days.  Nausea all the time, but vomiting just a few times.   No fever.   Some headache.  No body aches, no chills, no URI symptoms.   Tooth pain is better.   No recent travel or camping, no new animal exposure.   No other foods or things she thinks aggravated the symptoms or caused the symptoms.  Has mild belly discomfort.  Having diarrhea 4-9 x daily.  No other aggravating or relieving factors.    No other c/o.  Past Medical History:  Diagnosis Date   Anxiety    Depression    Dysphagia    Left upper quadrant abdominal pain 07/11/2020   Nausea and vomiting 07/11/2020   Throat pain    Vitamin D deficiency    Current Outpatient Medications on File Prior to Visit  Medication Sig Dispense Refill   busPIRone (BUSPAR) 15 MG tablet Take 20 mg by mouth 3 (three) times daily.     ciclopirox (PENLAC) 8 % solution Apply topically at bedtime. Apply over nail and surrounding skin. Apply daily over previous coat. After seven (7) days, may remove with alcohol and continue cycle. 6.6 mL 0   ergocalciferol (VITAMIN D2) 1.25 MG (50000 UT) capsule      ketoconazole (NIZORAL) 2 % cream Apply to affected areas in the 1/2 inch area surrounding the affected area once daily.  Once rash is completely resolved, continue applying to previously affected area for another 7 days. 60 g 1   lamoTRIgine (LAMICTAL) 25 MG tablet Take 2 tablets (50 mg total) by mouth daily. 180 tablet 0   midodrine (PROAMATINE) 10 MG tablet Take 10 mg by mouth 3  (three) times daily.     montelukast (SINGULAIR) 10 MG tablet Take 1 tablet (10 mg total) by mouth at bedtime. 30 tablet 5   Olopatadine HCl 0.2 % SOLN Apply 1 drop to eye 2 (two) times daily. 2.5 mL 5   Olopatadine-Mometasone (RYALTRIS) 665-25 MCG/ACT SUSP Place 2 sprays into the nose 2 (two) times daily. 29 g 5   prazosin (MINIPRESS) 5 MG capsule Take 1 capsule (5 mg total) by mouth at bedtime. 90 capsule 0   sertraline (ZOLOFT) 100 MG tablet Take 1 tablet (100 mg total) by mouth daily. 90 tablet 0   traZODone (DESYREL) 150 MG tablet Take 1 tablet (150 mg total) by mouth at bedtime. 90 tablet 0   triamcinolone cream (KENALOG) 0.5 % Apply 1 Application topically 2 (two) times daily. Apply to affected area(s) twice daily , do not apply to face. 80 g 0   No current facility-administered medications on file prior to visit.     The following portions of the patient's history were reviewed and updated as appropriate: allergies, current medications, past family history, past medical history, past social history, past surgical history and problem list.  ROS Otherwise as in subjective above  Objective: BP 120/80   Pulse 64   Temp  98.9 F (37.2 C)   Wt 264 lb 12.8 oz (120.1 kg)   BMI 44.07 kg/m   Wt Readings from Last 3 Encounters:  11/06/21 264 lb 12.8 oz (120.1 kg)  07/18/21 256 lb 4 oz (116.2 kg)  06/04/21 255 lb (115.7 kg)    General appearance: alert, no distress, well developed, well nourished HEENT: normocephalic, sclerae anicteric, conjunctiva pink and moist, TMs pearly, nares patent, no discharge or erythema, pharynx normal Oral cavity: MMM, no lesions Neck: supple, no lymphadenopathy, no thyromegaly, no masses Abdomen: +bs, soft, non tender, non distended, no masses, no hepatomegaly, no splenomegaly Pulses: 2+ radial pulses, 2+ pedal pulses, normal cap refill Ext: no edema    Assessment: Encounter Diagnoses  Name Primary?   Infectious diarrhea Yes   Nausea and  vomiting, unspecified vomiting type    High risk medication use      Plan: We discussed symptoms and concerns.  Given recent clindamycin use I suspect C. difficile infection.  We discussed other differential.  She will collect samples and timing them to do stool testing.  We discussed pros and cons of possibly beginning metronidazole for suspected C. difficile.  I did send the medication in case he wants to start this versus waiting results.  Can use Zofran as needed.  Can also use over-the-counter probiotic.  Push fluids to hydrate, brat diet.  Follow-up pending labs  After discussing her medications in general I recommend she follow-up with her psychiatrist to consider modification to her regimen.  She is on the prazosin for night terrors but could probably find a different regimen where she would not have to use the midodrine for low blood pressure  Nasia was seen today for diarrhea.  Diagnoses and all orders for this visit:  Infectious diarrhea -     GI Profile, Stool, PCR  Nausea and vomiting, unspecified vomiting type  High risk medication use  Other orders -     ondansetron (ZOFRAN) 4 MG tablet; Take 1 tablet (4 mg total) by mouth every 8 (eight) hours as needed for nausea or vomiting. -     metroNIDAZOLE (FLAGYL) 500 MG tablet; Take 1 tablet (500 mg total) by mouth 3 (three) times daily for 10 days.    Follow up: pending stool studies

## 2021-11-07 LAB — GI PROFILE, STOOL, PCR

## 2021-11-27 DIAGNOSIS — G4733 Obstructive sleep apnea (adult) (pediatric): Secondary | ICD-10-CM | POA: Diagnosis not present

## 2021-11-27 DIAGNOSIS — R400 Somnolence: Secondary | ICD-10-CM | POA: Diagnosis not present

## 2021-12-01 ENCOUNTER — Other Ambulatory Visit: Payer: Self-pay | Admitting: Physician Assistant

## 2021-12-04 ENCOUNTER — Ambulatory Visit: Payer: 59 | Admitting: Allergy

## 2021-12-04 DIAGNOSIS — J309 Allergic rhinitis, unspecified: Secondary | ICD-10-CM

## 2021-12-04 DIAGNOSIS — R69 Illness, unspecified: Secondary | ICD-10-CM | POA: Diagnosis not present

## 2021-12-10 ENCOUNTER — Encounter: Payer: Self-pay | Admitting: Internal Medicine

## 2021-12-18 DIAGNOSIS — R69 Illness, unspecified: Secondary | ICD-10-CM | POA: Diagnosis not present

## 2021-12-27 ENCOUNTER — Ambulatory Visit
Admission: RE | Admit: 2021-12-27 | Discharge: 2021-12-27 | Disposition: A | Discharge status: Home / Self Care | Payer: 59 | Source: Ambulatory Visit | Attending: Urgent Care | Admitting: Urgent Care

## 2021-12-27 VITALS — BP 121/76 | HR 69 | Temp 98.6°F | Resp 18

## 2021-12-27 DIAGNOSIS — J4530 Mild persistent asthma, uncomplicated: Secondary | ICD-10-CM

## 2021-12-27 DIAGNOSIS — R400 Somnolence: Secondary | ICD-10-CM | POA: Diagnosis not present

## 2021-12-27 DIAGNOSIS — B349 Viral infection, unspecified: Secondary | ICD-10-CM

## 2021-12-27 DIAGNOSIS — G4733 Obstructive sleep apnea (adult) (pediatric): Secondary | ICD-10-CM | POA: Diagnosis not present

## 2021-12-27 MED ORDER — PROMETHAZINE-DM 6.25-15 MG/5ML PO SYRP: 2.5000 mL | ORAL_SOLUTION | Freq: Three times a day (TID) | ORAL | 0 refills | Status: DC | PRN

## 2021-12-27 MED ORDER — PREDNISONE 50 MG PO TABS: 50.0000 mg | ORAL_TABLET | Freq: Every day | ORAL | 0 refills | Status: DC

## 2021-12-27 NOTE — ED Provider Notes (Signed)
[name]  Note:  This document was prepared using Conservation officer, historic buildings and may include unintentional dictation errors.  MRN: [id number] DOB: 04-23-1990  Subjective:   Cynthia Frazier is a 31 y.o. female presenting for 5-day history of cute onset persistent coughing, chest tightness, wheezing worse at night.  Has been using her albuterol inhaler especially at night.  No fever, runny or stuffy nose, sore throat, ear pain.  No other symptoms apart from the respiratory symptoms.  No smoking, vaping.  No current facility-administered medications for this encounter.  Current Outpatient Medications:    busPIRone (BUSPAR) 15 MG tablet, Take 20 mg by mouth 3 (three) times daily., Disp: , Rfl:    ciclopirox (PENLAC) 8 % solution, Apply topically at bedtime. Apply over nail and surrounding skin. Apply daily over previous coat. After seven (7) days, may remove with alcohol and continue cycle., Disp: 6.6 mL, Rfl: 0   ergocalciferol (VITAMIN D2) 1.25 MG (50000 UT) capsule, , Disp: , Rfl:    lamoTRIgine (LAMICTAL) 25 MG tablet, TAKE 2 TABLETS BY MOUTH DAILY, Disp: 180 tablet, Rfl: 0   midodrine (PROAMATINE) 10 MG tablet, Take 10 mg by mouth 3 (three) times daily., Disp: , Rfl:    montelukast (SINGULAIR) 10 MG tablet, Take 1 tablet (10 mg total) by mouth at bedtime., Disp: 30 tablet, Rfl: 5   Olopatadine HCl 0.2 % SOLN, Apply 1 drop to eye 2 (two) times daily., Disp: 2.5 mL, Rfl: 5   Olopatadine-Mometasone (RYALTRIS) 665-25 MCG/ACT SUSP, Place 2 sprays into the nose 2 (two) times daily., Disp: 29 g, Rfl: 5   sertraline (ZOLOFT) 100 MG tablet, TAKE 1 TABLET BY MOUTH DAILY, Disp: 90 tablet, Rfl: 0   traZODone (DESYREL) 150 MG tablet, TAKE 1 TABLET BY MOUTH AT BEDTIME, Disp: 90 tablet, Rfl: 0   ketoconazole (NIZORAL) 2 % cream, Apply to affected areas in the 1/2 inch area surrounding the affected area once daily.  Once rash is completely resolved, continue applying to  previously affected area for another 7 days., Disp: 60 g, Rfl: 1   ondansetron (ZOFRAN) 4 MG tablet, Take 1 tablet (4 mg total) by mouth every 8 (eight) hours as needed for nausea or vomiting., Disp: 20 tablet, Rfl: 0   prazosin (MINIPRESS) 5 MG capsule, TAKE 1 CAPSULE BY MOUTH AT BEDTIME, Disp: 90 capsule, Rfl: 0   triamcinolone cream (KENALOG) 0.5 %, Apply 1 Application topically 2 (two) times daily. Apply to affected area(s) twice daily , do not apply to face., Disp: 80 g, Rfl: 0   Allergies  Allergen Reactions   Sumatriptan Anaphylaxis   Vancomycin Anaphylaxis   Latex Hives   Levofloxacin Rash    Past Medical History:  Diagnosis Date   Anxiety    Depression    Dysphagia    Left upper quadrant abdominal pain 07/11/2020   Nausea and vomiting 07/11/2020   Throat pain    Vitamin D deficiency      Past Surgical History:  Procedure Laterality Date   CESAREAN SECTION     CHOLECYSTECTOMY      Family History  Adopted: Yes    Social History   Tobacco Use   Smoking status: Never   Smokeless tobacco: Never  Vaping Use   Vaping Use: Never used  Substance Use Topics   Alcohol use: Yes    Comment: 2 times/week   Drug use: Never    ROS   Objective:   Vitals: BP 121/76 (BP Location:  Left Arm)   Pulse 69   Temp 98.6 F (37 C) (Oral)   Resp 18   SpO2 96%   Physical Exam Constitutional:      General: She is not in acute distress.    Appearance: Normal appearance. She is well-developed. She is obese. She is not ill-appearing, toxic-appearing or diaphoretic.  HENT:     Head: Normocephalic and atraumatic.     Nose: Nose normal.     Mouth/Throat:     Mouth: Mucous membranes are moist.  Eyes:     General: No scleral icterus.       Right eye: No discharge.        Left eye: No discharge.     Extraocular Movements: Extraocular movements intact.  Cardiovascular:     Rate and Rhythm: Normal rate and regular rhythm.     Heart sounds: Normal heart sounds. No murmur  heard.    No friction rub. No gallop.  Pulmonary:     Effort: Pulmonary effort is normal. No respiratory distress.     Breath sounds: No stridor. No wheezing, rhonchi or rales.     Comments: Slight decrease in lung sounds throughout which may be due to body habitus versus mucous plugging in the context of her asthma. Chest:     Chest wall: No tenderness.  Skin:    General: Skin is warm and dry.  Neurological:     General: No focal deficit present.     Mental Status: She is alert and oriented to person, place, and time.  Psychiatric:        Mood and Affect: Mood normal.        Behavior: Behavior normal.     Assessment and Plan :   PDMP not reviewed this encounter.  1. Acute viral syndrome   2. Mild persistent asthma without complication     Given timeline of illness, deferred testing. Deferred imaging given clear cardiopulmonary exam, hemodynamically stable vital signs.  We will be using an oral prednisone course to help her with her respiratory symptoms.  As, suspect viral URI, viral syndrome. Physical exam findings reassuring and vital signs stable for discharge. Advised supportive care, offered symptomatic relief. Counseled patient on potential for adverse effects with medications prescribed/recommended today, ER and return-to-clinic precautions discussed, patient verbalized understanding.     Jaynee Eagles, PA-C 12/27/21 1140

## 2021-12-27 NOTE — ED Triage Notes (Signed)
Cough that started 5 days ago. Taking mucinex and delsym but has not helped cough.

## 2022-01-12 ENCOUNTER — Other Ambulatory Visit: Payer: Self-pay | Admitting: Allergy

## 2022-01-14 ENCOUNTER — Encounter: Payer: Self-pay | Admitting: Internal Medicine

## 2022-01-27 DIAGNOSIS — G4733 Obstructive sleep apnea (adult) (pediatric): Secondary | ICD-10-CM | POA: Diagnosis not present

## 2022-01-27 DIAGNOSIS — R400 Somnolence: Secondary | ICD-10-CM | POA: Diagnosis not present

## 2022-02-01 DIAGNOSIS — L089 Local infection of the skin and subcutaneous tissue, unspecified: Secondary | ICD-10-CM | POA: Diagnosis not present

## 2022-02-05 DIAGNOSIS — F4322 Adjustment disorder with anxiety: Secondary | ICD-10-CM | POA: Diagnosis not present

## 2022-02-14 ENCOUNTER — Other Ambulatory Visit: Payer: Self-pay | Admitting: Allergy

## 2022-02-26 DIAGNOSIS — G4733 Obstructive sleep apnea (adult) (pediatric): Secondary | ICD-10-CM | POA: Diagnosis not present

## 2022-02-26 DIAGNOSIS — R400 Somnolence: Secondary | ICD-10-CM | POA: Diagnosis not present

## 2022-03-12 DIAGNOSIS — F4322 Adjustment disorder with anxiety: Secondary | ICD-10-CM | POA: Diagnosis not present

## 2022-03-29 DIAGNOSIS — G4733 Obstructive sleep apnea (adult) (pediatric): Secondary | ICD-10-CM | POA: Diagnosis not present

## 2022-03-29 DIAGNOSIS — R400 Somnolence: Secondary | ICD-10-CM | POA: Diagnosis not present

## 2022-04-07 ENCOUNTER — Telehealth: Payer: 59

## 2022-04-07 DIAGNOSIS — Z20822 Contact with and (suspected) exposure to covid-19: Secondary | ICD-10-CM | POA: Diagnosis not present

## 2022-04-07 DIAGNOSIS — Z03818 Encounter for observation for suspected exposure to other biological agents ruled out: Secondary | ICD-10-CM | POA: Diagnosis not present

## 2022-04-08 ENCOUNTER — Telehealth: Payer: 59 | Admitting: Nurse Practitioner

## 2022-04-08 DIAGNOSIS — U071 COVID-19: Secondary | ICD-10-CM

## 2022-04-08 MED ORDER — ONDANSETRON HCL 4 MG PO TABS: 4.0000 mg | ORAL_TABLET | Freq: Three times a day (TID) | ORAL | 0 refills | Status: DC | PRN

## 2022-04-08 MED ORDER — FLUTICASONE PROPIONATE 50 MCG/ACT NA SUSP: 2.0000 | Freq: Every day | NASAL | 6 refills | Status: DC

## 2022-04-08 NOTE — Progress Notes (Signed)
Virtual Visit Consent   Cynthia Frazier, you are scheduled for a virtual visit with a [facility] provider today. Just as with appointments in the office, your consent must be obtained to participate. Your consent will be active for this visit and any virtual visit you may have with one of our providers in the next 365 days. If you have a MyChart account, a copy of this consent can be sent to you electronically.  As this is a virtual visit, video technology does not allow for your provider to perform a traditional examination. This may limit your provider's ability to fully assess your condition. If your provider identifies any concerns that need to be evaluated in person or the need to arrange testing (such as labs, EKG, etc.), we will make arrangements to do so. Although advances in technology are sophisticated, we cannot ensure that it will always work on either your end or our end. If the connection with a video visit is poor, the visit may have to be switched to a telephone visit. With either a video or telephone visit, we are not always able to ensure that we have a secure connection.  By engaging in this virtual visit, you consent to the provision of healthcare and authorize for your insurance to be billed (if applicable) for the services provided during this visit. Depending on your insurance coverage, you may receive a charge related to this service.  I need to obtain your verbal consent now. Are you willing to proceed with your visit today? Cynthia Frazier has provided verbal consent on 04/08/2022 for a virtual visit (video or telephone). Apolonio Schneiders, FNP  Date: 04/08/2022 7:54 AM  Virtual Visit via Video Note   I, Apolonio Schneiders, connected with  Cynthia Frazier  ([id number], Feb 14, 1991) on 04/08/22 at  8:15 AM EST by a video-enabled telemedicine application and verified that I am speaking with the correct person using two identifiers.  Location: Patient: Virtual Visit Location Patient:  Home Provider: Virtual Visit Location Provider: Home Office   I discussed the limitations of evaluation and management by telemedicine and the availability of in person appointments. The patient expressed understanding and agreed to proceed.    History of Present Illness: Cynthia Frazier is a 32 y.o. who identifies as a female who was assigned female at birth, and is being seen today after testing positive for COVID.  Nausea/vomiting/diarrhea Congestion & cough   Symptom onset 3 days ago   Her GI symptoms have continued into today   She has had COVID in the past Nov 7353 without complications   She has been vaccinated including most recent booster   She has a history of asthma  Patient has an Albuterol inhaler as a rescue inhaler as needed   Denies HTN, DM or history of immunocompromised status   Problems:  Patient Active Problem List   Diagnosis Date Noted   Vitamin D deficiency 06/11/2021   Asthma 06/04/2021   Obesity, morbid, BMI 40.0-49.9 ([place]) 06/04/2021   Onychomycosis 06/04/2021   Eczema, dyshidrotic 06/04/2021   S/P cholecystectomy 07/11/2020    Allergies:  Allergies  Allergen Reactions   Sumatriptan Anaphylaxis   Vancomycin Anaphylaxis   Latex Hives   Levofloxacin Rash   Medications:  Current Outpatient Medications:    busPIRone (BUSPAR) 15 MG tablet, Take 20 mg by mouth 3 (three) times daily., Disp: , Rfl:    ciclopirox (PENLAC) 8 % solution, Apply topically at bedtime. Apply over nail and surrounding skin. Apply daily over previous coat.  After seven (7) days, may remove with alcohol and continue cycle., Disp: 6.6 mL, Rfl: 0   ergocalciferol (VITAMIN D2) 1.25 MG (50000 UT) capsule, , Disp: , Rfl:    ketoconazole (NIZORAL) 2 % cream, Apply to affected areas in the 1/2 inch area surrounding the affected area once daily.  Once rash is completely resolved, continue applying to previously affected area for another 7 days., Disp: 60 g, Rfl: 1   lamoTRIgine  (LAMICTAL) 25 MG tablet, TAKE 2 TABLETS BY MOUTH DAILY, Disp: 180 tablet, Rfl: 0   midodrine (PROAMATINE) 10 MG tablet, Take 10 mg by mouth 3 (three) times daily., Disp: , Rfl:    montelukast (SINGULAIR) 10 MG tablet, TAKE 1 TABLET BY MOUTH AT BEDTIME, Disp: 30 tablet, Rfl: 0   Olopatadine HCl 0.2 % SOLN, Apply 1 drop to eye 2 (two) times daily., Disp: 2.5 mL, Rfl: 5   Olopatadine-Mometasone (RYALTRIS) 665-25 MCG/ACT SUSP, Place 2 sprays into the nose 2 (two) times daily., Disp: 29 g, Rfl: 5   ondansetron (ZOFRAN) 4 MG tablet, Take 1 tablet (4 mg total) by mouth every 8 (eight) hours as needed for nausea or vomiting., Disp: 20 tablet, Rfl: 0   prazosin (MINIPRESS) 5 MG capsule, TAKE 1 CAPSULE BY MOUTH AT BEDTIME, Disp: 90 capsule, Rfl: 0   predniSONE (DELTASONE) 50 MG tablet, Take 1 tablet (50 mg total) by mouth daily with breakfast., Disp: 5 tablet, Rfl: 0   promethazine-dextromethorphan (PROMETHAZINE-DM) 6.25-15 MG/5ML syrup, Take 2.5 mLs by mouth 3 (three) times daily as needed for cough., Disp: 100 mL, Rfl: 0   sertraline (ZOLOFT) 100 MG tablet, TAKE 1 TABLET BY MOUTH DAILY, Disp: 90 tablet, Rfl: 0   traZODone (DESYREL) 150 MG tablet, TAKE 1 TABLET BY MOUTH AT BEDTIME, Disp: 90 tablet, Rfl: 0   triamcinolone cream (KENALOG) 0.5 %, Apply 1 Application topically 2 (two) times daily. Apply to affected area(s) twice daily , do not apply to face., Disp: 80 g, Rfl: 0  Observations/Objective: Patient is well-developed, well-nourished in no acute distress.  Resting comfortably  at home.  Head is normocephalic, atraumatic.  No labored breathing.  Speech is clear and coherent with logical content.  Patient is alert and oriented at baseline.    Assessment and Plan: 1. COVID-19 Push fluids  Bland diet  Introduce protein and assure caloric intake   - ondansetron (ZOFRAN) 4 MG tablet; Take 1 tablet (4 mg total) by mouth every 8 (eight) hours as needed for nausea or vomiting.  Dispense: 20 tablet;  Refill: 0 - fluticasone (FLONASE) 50 MCG/ACT nasal spray; Place 2 sprays into both nostrils daily.  Dispense: 16 g; Refill: 6     Follow Up Instructions: I discussed the assessment and treatment plan with the patient. The patient was provided an opportunity to ask questions and all were answered. The patient agreed with the plan and demonstrated an understanding of the instructions.  A copy of instructions were sent to the patient via MyChart unless otherwise noted below.    The patient was advised to call back or seek an in-person evaluation if the symptoms worsen or if the condition fails to improve as anticipated.  Time:  I spent 12 minutes with the patient via telehealth technology discussing the above problems/concerns.    Apolonio Schneiders, FNP

## 2022-04-16 ENCOUNTER — Ambulatory Visit
Admission: RE | Admit: 2022-04-16 | Discharge: 2022-04-16 | Disposition: A | Discharge status: Home / Self Care | Payer: 59 | Source: Ambulatory Visit | Attending: Nurse Practitioner | Admitting: Nurse Practitioner

## 2022-04-16 ENCOUNTER — Ambulatory Visit (INDEPENDENT_AMBULATORY_CARE_PROVIDER_SITE_OTHER): Payer: 59

## 2022-04-16 VITALS — BP 132/78 | HR 76 | Temp 99.3°F | Resp 18

## 2022-04-16 DIAGNOSIS — R109 Unspecified abdominal pain: Secondary | ICD-10-CM | POA: Diagnosis not present

## 2022-04-16 DIAGNOSIS — R300 Dysuria: Secondary | ICD-10-CM | POA: Diagnosis not present

## 2022-04-16 LAB — POCT URINALYSIS DIP (MANUAL ENTRY)
Bilirubin, UA: NEGATIVE
Glucose, UA: NEGATIVE mg/dL
Ketones, POC UA: NEGATIVE mg/dL
Leukocytes, UA: NEGATIVE
Nitrite, UA: NEGATIVE
Protein Ur, POC: NEGATIVE mg/dL
Spec Grav, UA: 1.015 (ref 1.010–1.025)
Urobilinogen, UA: 0.2 E.U./dL
pH, UA: 7 (ref 5.0–8.0)

## 2022-04-16 LAB — POCT URINE PREGNANCY: Preg Test, Ur: NEGATIVE

## 2022-04-16 MED ORDER — PHENAZOPYRIDINE HCL 200 MG PO TABS: 200.0000 mg | ORAL_TABLET | Freq: Three times a day (TID) | ORAL | 0 refills | Status: DC

## 2022-04-16 NOTE — ED Provider Notes (Signed)
[facility]    CSN: [id number] Arrival date & time: 04/16/22  1331      History   Chief Complaint Chief Complaint  Patient presents with   Urinary Frequency    Bladder/flank pain - Entered by patient    HPI Cynthia Frazier is a 32 y.o. female presents for evaluation of urinary symptoms.  Patient reports 1 week of urinary frequency with cramping at end of urination.  She also endorses some bilateral flank pain that is worse on the right.  Denies any hematuria, frequency, urgency, fevers, nausea/vomiting.  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  Denies history of kidney stones.  No STD exposure or concern.  Has been taking ibuprofen and Tylenol OTC.  No other concerns at this time.   Urinary Frequency    Past Medical History:  Diagnosis Date   Anxiety    Depression    Dysphagia    Left upper quadrant abdominal pain 07/11/2020   Nausea and vomiting 07/11/2020   Throat pain    Vitamin D deficiency     Patient Active Problem List   Diagnosis Date Noted   Vitamin D deficiency 06/11/2021   Asthma 06/04/2021   Obesity, morbid, BMI 40.0-49.9 ([place]) 06/04/2021   Onychomycosis 06/04/2021   Eczema, dyshidrotic 06/04/2021   S/P cholecystectomy 07/11/2020    Past Surgical History:  Procedure Laterality Date   CESAREAN SECTION     CHOLECYSTECTOMY      OB History   No obstetric history on file.      Home Medications    Prior to Admission medications   Medication Sig Start Date End Date Taking? Authorizing Provider  phenazopyridine (PYRIDIUM) 200 MG tablet Take 1 tablet (200 mg total) by mouth 3 (three) times daily. 04/16/22  Yes Melynda Ripple, NP  busPIRone (BUSPAR) 15 MG tablet Take 20 mg by mouth 3 (three) times daily.    [provider]  ciclopirox (PENLAC) 8 % solution Apply topically at bedtime. Apply over nail and surrounding skin. Apply daily over  previous coat. After seven (7) days, may remove with alcohol and continue cycle. 06/04/21   Francis Gaines B, PA-C  ergocalciferol (VITAMIN D2) 1.25 MG (50000 UT) capsule  12/23/19   [provider]  fluticasone (FLONASE) 50 MCG/ACT nasal spray Place 2 sprays into both nostrils daily. 04/08/22   Apolonio Schneiders, FNP  ketoconazole (NIZORAL) 2 % cream Apply to affected areas in the 1/2 inch area surrounding the affected area once daily.  Once rash is completely resolved, continue applying to previously affected area for another 7 days. 09/09/21   Lynden Oxford Scales, PA-C  lamoTRIgine (LAMICTAL) 25 MG tablet TAKE 2 TABLETS BY MOUTH DAILY 12/03/21   Denita Lung, MD  midodrine (PROAMATINE) 10 MG tablet Take 10 mg by mouth 3 (three) times daily.    [provider]  montelukast (SINGULAIR) 10 MG tablet TAKE 1 TABLET BY MOUTH AT BEDTIME 01/13/22   Kennith Gain, MD  Olopatadine HCl 0.2 % SOLN Apply 1 drop to eye 2 (two) times daily. 07/18/21   Kennith Gain, MD  Olopatadine-Mometasone Rennie Plowman) [phone] MCG/ACT SUSP Place 2 sprays into the nose 2 (two) times daily. 07/18/21   Kennith Gain, MD  ondansetron (ZOFRAN) 4 MG tablet Take 1 tablet (4 mg total) by mouth every 8 (eight) hours as needed for nausea or vomiting. 11/06/21  Tysinger, Camelia Eng, PA-C  ondansetron (ZOFRAN) 4 MG tablet Take 1 tablet (4 mg total) by mouth every 8 (eight) hours as needed for nausea or vomiting. 04/08/22   Apolonio Schneiders, FNP  prazosin (MINIPRESS) 5 MG capsule TAKE 1 CAPSULE BY MOUTH AT BEDTIME 12/03/21   Denita Lung, MD  predniSONE (DELTASONE) 50 MG tablet Take 1 tablet (50 mg total) by mouth daily with breakfast. 12/27/21   Jaynee Eagles, PA-C  promethazine-dextromethorphan (PROMETHAZINE-DM) 6.25-15 MG/5ML syrup Take 2.5 mLs by mouth 3 (three) times daily as needed for cough. 12/27/21   Jaynee Eagles, PA-C  sertraline (ZOLOFT) 100 MG tablet TAKE 1 TABLET BY MOUTH DAILY 12/03/21    Denita Lung, MD  traZODone (DESYREL) 150 MG tablet TAKE 1 TABLET BY MOUTH AT BEDTIME 12/03/21   Denita Lung, MD  triamcinolone cream (KENALOG) 0.5 % Apply 1 Application topically 2 (two) times daily. Apply to affected area(s) twice daily , do not apply to face. 09/09/21   Lynden Oxford Scales, PA-C    Family History Family History  Adopted: Yes    Social History Social History   Tobacco Use   Smoking status: Never   Smokeless tobacco: Never  Vaping Use   Vaping Use: Never used  Substance Use Topics   Alcohol use: Yes    Comment: 2 times/week   Drug use: Never     Allergies   Sumatriptan, Vancomycin, Latex, and Levofloxacin   Review of Systems Review of Systems  Genitourinary:  Positive for flank pain and frequency.     Physical Exam Triage Vital Signs ED Triage Vitals [04/16/22 1348]  Enc Vitals Group     BP 132/78     Pulse Rate 76     Resp 18     Temp 99.3 F (37.4 C)     Temp Source Oral     SpO2 98 %     Weight      Height      Head Circumference      Peak Flow      Pain Score      Pain Loc      Pain Edu?      Excl. in [place]?    No data found.  Updated Vital Signs BP 132/78 (BP Location: Right Arm)   Pulse 76   Temp 99.3 F (37.4 C) (Oral)   Resp 18   SpO2 98%   Visual Acuity Right Eye Distance:   Left Eye Distance:   Bilateral Distance:    Right Eye Near:   Left Eye Near:    Bilateral Near:     Physical Exam Vitals and nursing note reviewed.  Constitutional:      Appearance: Normal appearance. She is obese. She is not ill-appearing, toxic-appearing or diaphoretic.  HENT:     Head: Normocephalic and atraumatic.  Eyes:     Pupils: Pupils are equal, round, and reactive to light.  Cardiovascular:     Rate and Rhythm: Normal rate.  Pulmonary:     Effort: Pulmonary effort is normal.  Musculoskeletal:     Thoracic back: Tenderness present. No bony tenderness.       Back:     Comments: R>L tenderness with palpation   Skin:     General: Skin is warm and dry.  Neurological:     General: No focal deficit present.     Mental Status: She is alert and oriented to person, place, and time.  Psychiatric:  Mood and Affect: Mood normal.        Behavior: Behavior normal.      UC Treatments / Results  Labs (all labs ordered are listed, but only abnormal results are displayed) Labs Reviewed  POCT URINALYSIS DIP (MANUAL ENTRY) - Abnormal; Notable for the following components:      Result Value   Color, UA light yellow (*)    Blood, UA small (*)    All other components within normal limits  URINE CULTURE  POCT URINE PREGNANCY    EKG   Radiology DG Abdomen 1 View  Result Date: 04/16/2022 CLINICAL DATA:  Right flank pain with hematuria. EXAM: ABDOMEN - 1 VIEW COMPARISON:  None Available. FINDINGS: The bowel gas pattern is normal. No radio-opaque calculi or other significant radiographic abnormality are seen. Intrauterine device is noted in the pelvis. Possible ligation clips are seen in the pelvis as well. IMPRESSION: No definite nephrolithiasis.  No abnormal bowel dilatation. Electronically Signed   By: Marijo Conception M.D.   On: 04/16/2022 14:20    Procedures Procedures (including critical care time)  Medications Ordered in UC Medications - No data to display  Initial Impression / Assessment and Plan / UC Course  I have reviewed the triage vital signs and the nursing notes.  Pertinent labs & imaging results that were available during my care of the patient were reviewed by me and considered in my medical decision making (see chart for details).  Clinical Course as of 04/16/22 1431  Wed Apr 16, 2022  1430 Temp recheck 98.8 oral  [JM]    Clinical Course User Index [JM] Melynda Ripple, NP    UA with trace blood otherwise no signs of UTI.  Will culture X-ray negative for kidney stones Pyridium PRN Increase fluids Continue Tylenol as needed PCP follow-up in 2 to 3 days for recheck ER precautions  reviewed and patient verbalized understanding   Final Clinical Impressions(s) / UC Diagnoses   Final diagnoses:  Dysuria  Right flank pain   Discharge Instructions   None    ED Prescriptions     Medication Sig Dispense Auth. Provider   phenazopyridine (PYRIDIUM) 200 MG tablet Take 1 tablet (200 mg total) by mouth 3 (three) times daily. 6 tablet Melynda Ripple, NP      PDMP not reviewed this encounter.   Melynda Ripple, NP 04/16/22 1433

## 2022-04-16 NOTE — Discharge Instructions (Signed)
Pyridium as needed.  Please of this medication make your urine orange I will send her urine for a culture and contact you with results that is positive Increase fluids Can continue Tylenol as needed Follow-up with your PCP in 2 days for recheck Please go to emergency room if you have any worsening symptoms

## 2022-04-16 NOTE — ED Triage Notes (Signed)
Pt c/o urinary frequency (1 week), flank/ lower back pain, and abd pain.   Home interventions: motrin, tylenol

## 2022-04-17 LAB — URINE CULTURE

## 2022-04-29 DIAGNOSIS — G4733 Obstructive sleep apnea (adult) (pediatric): Secondary | ICD-10-CM | POA: Diagnosis not present

## 2022-04-29 DIAGNOSIS — R400 Somnolence: Secondary | ICD-10-CM | POA: Diagnosis not present

## 2022-05-12 ENCOUNTER — Ambulatory Visit (INDEPENDENT_AMBULATORY_CARE_PROVIDER_SITE_OTHER): Payer: 59

## 2022-05-12 ENCOUNTER — Ambulatory Visit
Admission: RE | Admit: 2022-05-12 | Discharge: 2022-05-12 | Disposition: A | Discharge status: Home / Self Care | Payer: 59 | Source: Ambulatory Visit | Attending: Family Medicine | Admitting: Family Medicine

## 2022-05-12 VITALS — BP 126/83 | HR 83 | Temp 98.5°F | Resp 18 | Ht 64.0 in | Wt 250.0 lb

## 2022-05-12 DIAGNOSIS — J4541 Moderate persistent asthma with (acute) exacerbation: Secondary | ICD-10-CM | POA: Diagnosis not present

## 2022-05-12 DIAGNOSIS — J189 Pneumonia, unspecified organism: Secondary | ICD-10-CM | POA: Diagnosis not present

## 2022-05-12 DIAGNOSIS — R042 Hemoptysis: Secondary | ICD-10-CM

## 2022-05-12 MED ORDER — PREDNISONE 20 MG PO TABS: 40.0000 mg | ORAL_TABLET | Freq: Every day | ORAL | 0 refills | Status: DC

## 2022-05-12 MED ORDER — AMOXICILLIN-POT CLAVULANATE 875-125 MG PO TABS: 1.0000 | ORAL_TABLET | Freq: Two times a day (BID) | ORAL | 0 refills | Status: DC

## 2022-05-12 MED ORDER — FLUCONAZOLE 150 MG PO TABS: 150.0000 mg | ORAL_TABLET | Freq: Every day | ORAL | 0 refills | Status: DC

## 2022-05-12 MED ORDER — AZITHROMYCIN 250 MG PO TABS: ORAL_TABLET | ORAL | 0 refills | Status: DC

## 2022-05-12 NOTE — ED Provider Notes (Signed)
[name]    CSN: [id number] Arrival date & time: 05/12/22  0847      History   Chief Complaint Chief Complaint  Patient presents with   Cough    Entered by patient    HPI Cynthia Frazier is a 32 y.o. female.   HPI  Patient states she started off with some cold symptoms and nasal congestion.  This has been several days.  For the last 3 days she has had increasing cough and congestion.  States she does have underlying asthma.  She states she is prone to pneumonia.  This morning she started coughing up bloody sputum.  She is here for evaluation.  No sweats chills or fever.  Past Medical History:  Diagnosis Date   Anxiety    Depression    Dysphagia    Left upper quadrant abdominal pain 07/11/2020   Nausea and vomiting 07/11/2020   Throat pain    Vitamin D deficiency     Patient Active Problem List   Diagnosis Date Noted   Vitamin D deficiency 06/11/2021   Asthma 06/04/2021   Obesity, morbid, BMI 40.0-49.9 ([place]) 06/04/2021   Onychomycosis 06/04/2021   Eczema, dyshidrotic 06/04/2021   S/P cholecystectomy 07/11/2020    Past Surgical History:  Procedure Laterality Date   CESAREAN SECTION     CHOLECYSTECTOMY      OB History   No obstetric history on file.      Home Medications    Prior to Admission medications   Medication Sig Start Date End Date Taking? Authorizing Provider  amoxicillin-clavulanate (AUGMENTIN) 875-125 MG tablet Take 1 tablet by mouth every 12 (twelve) hours. 05/12/22  Yes Raylene Everts, MD  azithromycin (ZITHROMAX Z-PAK) 250 MG tablet Take two pills today followed by one a day until gone 05/12/22  Yes Raylene Everts, MD  busPIRone (BUSPAR) 15 MG tablet Take 20 mg by mouth 3 (three) times daily.   Yes [provider]  ciclopirox (PENLAC) 8 % solution Apply topically at bedtime. Apply over nail and surrounding skin. Apply daily over previous coat. After seven (7) days, may remove with alcohol and continue cycle.  06/04/21  Yes Francis Gaines B, PA-C  ergocalciferol (VITAMIN D2) 1.25 MG (50000 UT) capsule  12/23/19  Yes [provider]  fluconazole (DIFLUCAN) 150 MG tablet Take 1 tablet (150 mg total) by mouth daily. Repeat in 1 week if needed 05/12/22  Yes Raylene Everts, MD  fluticasone [facility]) 50 MCG/ACT nasal spray Place 2 sprays into both nostrils daily. 04/08/22  Yes Apolonio Schneiders, FNP  lamoTRIgine (LAMICTAL) 25 MG tablet TAKE 2 TABLETS BY MOUTH DAILY 12/03/21  Yes Denita Lung, MD  montelukast (SINGULAIR) 10 MG tablet TAKE 1 TABLET BY MOUTH AT BEDTIME 01/13/22  Yes Padgett, Rae Halsted, MD  Olopatadine HCl 0.2 % SOLN Apply 1 drop to eye 2 (two) times daily. 07/18/21  Yes Padgett, Rae Halsted, MD  Olopatadine-Mometasone Rennie Plowman) [phone] MCG/ACT SUSP Place 2 sprays into the nose 2 (two) times daily. 07/18/21  Yes Padgett, Rae Halsted, MD  predniSONE (DELTASONE) 20 MG tablet Take 2 tablets (40 mg total) by mouth daily with breakfast. 05/12/22  Yes Raylene Everts, MD  sertraline (ZOLOFT) 100 MG tablet TAKE 1 TABLET BY MOUTH DAILY 12/03/21  Yes Denita Lung, MD  midodrine (PROAMATINE) 10 MG tablet Take 10 mg by mouth 3 (three) times daily.    [provider]  ondansetron (ZOFRAN) 4 MG tablet Take 1 tablet (4 mg total)  by mouth every 8 (eight) hours as needed for nausea or vomiting. 11/06/21   Tysinger, Camelia Eng, PA-C    Family History Family History  Adopted: Yes    Social History Social History   Tobacco Use   Smoking status: Never   Smokeless tobacco: Never  Vaping Use   Vaping Use: Never used  Substance Use Topics   Alcohol use: Yes    Comment: 2 times/week   Drug use: Never     Allergies   Sumatriptan, Vancomycin, Latex, and Levofloxacin   Review of Systems Review of Systems  See HPI  Physical Exam Triage Vital Signs ED Triage Vitals  Enc Vitals Group     BP 05/12/22 0908 126/83     Pulse Rate 05/12/22 0908 83     Resp 05/12/22 0908  18     Temp 05/12/22 0908 98.5 F (36.9 C)     Temp Source 05/12/22 0908 Oral     SpO2 05/12/22 0908 99 %     Weight 05/12/22 0910 250 lb (113.4 kg)     Height 05/12/22 0910 '5\' 4"'$  (1.626 m)     Head Circumference --      Peak Flow --      Pain Score 05/12/22 0909 4     Pain Loc --      Pain Edu? --      Excl. in [place]? --    No data found.  Updated Vital Signs BP 126/83 (BP Location: Right Arm)   Pulse 83   Temp 98.5 F (36.9 C) (Oral)   Resp 18   Ht '5\' 4"'$  (1.626 m)   Wt 113.4 kg   SpO2 99%   BMI 42.91 kg/m      Physical Exam Constitutional:      General: She is not in acute distress.    Appearance: She is well-developed. She is obese. She is ill-appearing.  HENT:     Head: Normocephalic and atraumatic.     Right Ear: Tympanic membrane and ear canal normal.     Left Ear: Tympanic membrane and ear canal normal.     Nose: Nose normal. No congestion.     Mouth/Throat:     Pharynx: No posterior oropharyngeal erythema.  Eyes:     Conjunctiva/sclera: Conjunctivae normal.     Pupils: Pupils are equal, round, and reactive to light.  Cardiovascular:     Rate and Rhythm: Normal rate and regular rhythm.     Heart sounds: Normal heart sounds.  Pulmonary:     Effort: Pulmonary effort is normal. No respiratory distress.     Breath sounds: Wheezing and rales present. No rhonchi.     Comments: Rales right greater than left base.  Few scattered wheeze Abdominal:     General: There is no distension.     Palpations: Abdomen is soft.  Musculoskeletal:        General: Normal range of motion.     Cervical back: Normal range of motion.  Lymphadenopathy:     Cervical: No cervical adenopathy.  Skin:    General: Skin is warm and dry.  Neurological:     Mental Status: She is alert.      UC Treatments / Results  Labs (all labs ordered are listed, but only abnormal results are displayed) Labs Reviewed - No data to display  EKG   Radiology DG Chest 2 View  Result Date:  05/12/2022 CLINICAL DATA:  Hemoptysis EXAM: CHEST - 2 VIEW COMPARISON:  06/26/2021 FINDINGS:  The heart size and mediastinal contours are within normal limits. Mild diffuse bilateral interstitial pulmonary opacity. The visualized skeletal structures are unremarkable. IMPRESSION: Mild diffuse bilateral interstitial pulmonary opacity, consistent with edema or infection. No focal airspace opacity. Electronically Signed   By: Delanna Ahmadi M.D.   On: 05/12/2022 09:57    Procedures Procedures (including critical care time)  Medications Ordered in UC Medications - No data to display  Initial Impression / Assessment and Plan / UC Course  I have reviewed the triage vital signs and the nursing notes.  Pertinent labs & imaging results that were available during my care of the patient were reviewed by me and considered in my medical decision making (see chart for details).     Discussed x-ray findings with patient.  Treatment is reviewed.  To ER if worse, short of breath Final Clinical Impressions(s) / UC Diagnoses   Final diagnoses:  Community acquired pneumonia, unspecified laterality  Moderate persistent asthma with exacerbation     Discharge Instructions      Take the Augmentin 2 times a day. In addition take the Z-Pak as prescribed, 2 pills today then 1 a day until gone I have prescribed prednisone to take once a day for 5 days. Make sure you are drinking lots of water Get lots of rest See your PCP in 3-4 weeks for re check.       ED Prescriptions     Medication Sig Dispense Auth. Provider   azithromycin (ZITHROMAX Z-PAK) 250 MG tablet Take two pills today followed by one a day until gone 6 tablet Raylene Everts, MD   amoxicillin-clavulanate (AUGMENTIN) 875-125 MG tablet Take 1 tablet by mouth every 12 (twelve) hours. 14 tablet Raylene Everts, MD   predniSONE (DELTASONE) 20 MG tablet Take 2 tablets (40 mg total) by mouth daily with breakfast. 10 tablet Raylene Everts,  MD   fluconazole (DIFLUCAN) 150 MG tablet Take 1 tablet (150 mg total) by mouth daily. Repeat in 1 week if needed 2 tablet Raylene Everts, MD      PDMP not reviewed this encounter.   Raylene Everts, MD 05/12/22 1022

## 2022-05-12 NOTE — ED Triage Notes (Signed)
Patient c/o productive cough, nasal drainage, chest congestion x 3 days.  Patient has taken Mucinex and Dayquil.

## 2022-05-12 NOTE — Discharge Instructions (Addendum)
Take the Augmentin 2 times a day. In addition take the Z-Pak as prescribed, 2 pills today then 1 a day until gone I have prescribed prednisone to take once a day for 5 days. Make sure you are drinking lots of water Get lots of rest See your PCP in 3-4 weeks for re check.

## 2022-05-13 ENCOUNTER — Telehealth: Payer: Self-pay

## 2022-05-13 NOTE — Telephone Encounter (Signed)
Pt states she's feeling about the same but has started her meds. Advised to continue to take meds until completed. Rest and hydrate. Call if any questions or concerns.

## 2022-05-14 DIAGNOSIS — F4322 Adjustment disorder with anxiety: Secondary | ICD-10-CM | POA: Diagnosis not present

## 2022-05-19 ENCOUNTER — Encounter (HOSPITAL_BASED_OUTPATIENT_CLINIC_OR_DEPARTMENT_OTHER): Payer: Self-pay | Admitting: Emergency Medicine

## 2022-05-19 ENCOUNTER — Emergency Department (HOSPITAL_BASED_OUTPATIENT_CLINIC_OR_DEPARTMENT_OTHER)
Admission: EM | Admit: 2022-05-19 | Discharge: 2022-05-19 | Disposition: A | Discharge status: Home / Self Care | Payer: 59 | Attending: Emergency Medicine | Admitting: Emergency Medicine

## 2022-05-19 ENCOUNTER — Ambulatory Visit: Payer: Self-pay

## 2022-05-19 ENCOUNTER — Emergency Department (HOSPITAL_BASED_OUTPATIENT_CLINIC_OR_DEPARTMENT_OTHER): Payer: 59 | Admitting: Radiology

## 2022-05-19 ENCOUNTER — Other Ambulatory Visit: Payer: Self-pay

## 2022-05-19 DIAGNOSIS — R059 Cough, unspecified: Secondary | ICD-10-CM | POA: Insufficient documentation

## 2022-05-19 DIAGNOSIS — Z91040 Latex allergy status: Secondary | ICD-10-CM | POA: Insufficient documentation

## 2022-05-19 DIAGNOSIS — R0789 Other chest pain: Secondary | ICD-10-CM | POA: Diagnosis not present

## 2022-05-19 LAB — CBC WITH DIFFERENTIAL/PLATELET
Abs Immature Granulocytes: 0.09 10*3/uL — ABNORMAL HIGH (ref 0.00–0.07)
Basophils Absolute: 0 10*3/uL (ref 0.0–0.1)
Basophils Relative: 0 %
Eosinophils Absolute: 0.3 10*3/uL (ref 0.0–0.5)
Eosinophils Relative: 2 %
HCT: 41.1 % (ref 36.0–46.0)
Hemoglobin: 14.2 g/dL (ref 12.0–15.0)
Immature Granulocytes: 1 %
Lymphocytes Relative: 33 %
Lymphs Abs: 3.7 10*3/uL (ref 0.7–4.0)
MCH: 30.7 pg (ref 26.0–34.0)
MCHC: 34.5 g/dL (ref 30.0–36.0)
MCV: 89 fL (ref 80.0–100.0)
Monocytes Absolute: 0.8 10*3/uL (ref 0.1–1.0)
Monocytes Relative: 7 %
Neutro Abs: 6.2 10*3/uL (ref 1.7–7.7)
Neutrophils Relative %: 57 %
Platelets: 305 10*3/uL (ref 150–400)
RBC: 4.62 MIL/uL (ref 3.87–5.11)
RDW: 12.2 % (ref 11.5–15.5)
WBC: 11 10*3/uL — ABNORMAL HIGH (ref 4.0–10.5)
nRBC: 0 % (ref 0.0–0.2)

## 2022-05-19 LAB — BASIC METABOLIC PANEL
Anion gap: 10 (ref 5–15)
BUN: 15 mg/dL (ref 6–20)
CO2: 23 mmol/L (ref 22–32)
Calcium: 8.9 mg/dL (ref 8.9–10.3)
Chloride: 107 mmol/L (ref 98–111)
Creatinine, Ser: 0.73 mg/dL (ref 0.44–1.00)
GFR, Estimated: >60 mL/min (ref >60)
Glucose, Bld: 87 mg/dL (ref 70–99)
Potassium: 3.5 mmol/L (ref 3.5–5.1)
Sodium: 140 mmol/L (ref 135–145)

## 2022-05-19 LAB — TROPONIN I (HIGH SENSITIVITY): Troponin I (High Sensitivity): 3 ng/L (ref <18)

## 2022-05-19 MED ORDER — IBUPROFEN 800 MG PO TABS
800.0000 mg | ORAL_TABLET | Freq: Once | ORAL | Status: AC
  Administered 2022-05-19: 800 mg via ORAL
  Filled 2022-05-19: qty 1

## 2022-05-19 NOTE — ED Triage Notes (Signed)
Patient reports she's getting over pneumonia and has completed abx and is now c/o chest pain radiating into her left shoulder and left side of her neck.  Patient denies shob and reports nausea.

## 2022-05-19 NOTE — Discharge Instructions (Signed)
You were seen today for chest pain.  Your workup today is reassuring.  Trial ibuprofen as needed for discomfort.  If you have new or worsening symptoms, you should be reevaluated.

## 2022-05-19 NOTE — ED Provider Notes (Signed)
[facility] Provider Note   CSN: [id number] Arrival date & time: 05/19/22  [id number]     History  Chief Complaint  Patient presents with   Chest Pain   Shoulder Pain    Cynthia Frazier is a 32 y.o. female.  HPI     This is a 32 year old female who presents with chest and shoulder pain.  Patient was recently diagnosed with pneumonia.  She finished a course of amoxicillin and azithromycin as well as prednisone.  She states that she has had some chest pain during this time.  She describes that as achy pain that radiates into the left shoulder and left side of the neck.  She has some ongoing nonproductive cough but no shortness of breath or nausea.  No fevers.  Pain is not worse with deep breathing.  She is not on any estrogen containing products.  No history of PE.  No leg swelling or pain.  Home Medications Prior to Admission medications   Medication Sig Start Date End Date Taking? Authorizing Provider  amoxicillin-clavulanate (AUGMENTIN) 875-125 MG tablet Take 1 tablet by mouth every 12 (twelve) hours. 05/12/22   Raylene Everts, MD  azithromycin (ZITHROMAX Z-PAK) 250 MG tablet Take two pills today followed by one a day until gone 05/12/22   Raylene Everts, MD  busPIRone (BUSPAR) 15 MG tablet Take 20 mg by mouth 3 (three) times daily.    [provider]  ciclopirox (PENLAC) 8 % solution Apply topically at bedtime. Apply over nail and surrounding skin. Apply daily over previous coat. After seven (7) days, may remove with alcohol and continue cycle. 06/04/21   Francis Gaines B, PA-C  ergocalciferol (VITAMIN D2) 1.25 MG (50000 UT) capsule  12/23/19   [provider]  fluconazole (DIFLUCAN) 150 MG tablet Take 1 tablet (150 mg total) by mouth daily. Repeat in 1 week if needed 05/12/22   Raylene Everts, MD  fluticasone [facility]) 50 MCG/ACT nasal spray Place 2 sprays into both nostrils daily. 04/08/22   Apolonio Schneiders, FNP   lamoTRIgine (LAMICTAL) 25 MG tablet TAKE 2 TABLETS BY MOUTH DAILY 12/03/21   Denita Lung, MD  midodrine (PROAMATINE) 10 MG tablet Take 10 mg by mouth 3 (three) times daily.    [provider]  montelukast (SINGULAIR) 10 MG tablet TAKE 1 TABLET BY MOUTH AT BEDTIME 01/13/22   Kennith Gain, MD  Olopatadine HCl 0.2 % SOLN Apply 1 drop to eye 2 (two) times daily. 07/18/21   Kennith Gain, MD  Olopatadine-Mometasone [facility]) [phone] MCG/ACT SUSP Place 2 sprays into the nose 2 (two) times daily. 07/18/21   Kennith Gain, MD  ondansetron (ZOFRAN) 4 MG tablet Take 1 tablet (4 mg total) by mouth every 8 (eight) hours as needed for nausea or vomiting. 11/06/21   Tysinger, Camelia Eng, PA-C  predniSONE (DELTASONE) 20 MG tablet Take 2 tablets (40 mg total) by mouth daily with breakfast. 05/12/22   Raylene Everts, MD  sertraline (ZOLOFT) 100 MG tablet TAKE 1 TABLET BY MOUTH DAILY 12/03/21   Denita Lung, MD      Allergies    Sumatriptan, Vancomycin, Latex, Avocado, Banana, Kiwi extract, and Levofloxacin    Review of Systems   Review of Systems  Constitutional:  Negative for fever.  Respiratory:  Positive for cough. Negative for shortness of breath.   Cardiovascular:  Positive for chest pain.  Gastrointestinal:  Negative for abdominal pain.  All other systems reviewed  and are negative.   Physical Exam Updated Vital Signs BP 122/77 (BP Location: Right Arm)   Pulse 66   Temp 98 F (36.7 C) (Oral)   Resp 20   Ht 1.626 m (5\' 4" )   Wt 113.4 kg   SpO2 100%   BMI 42.91 kg/m  Physical Exam Vitals and nursing note reviewed.  Constitutional:      Appearance: She is well-developed. She is obese. She is not ill-appearing.  HENT:     Head: Normocephalic and atraumatic.  Eyes:     Pupils: Pupils are equal, round, and reactive to light.  Cardiovascular:     Rate and Rhythm: Normal rate and regular rhythm.     Heart sounds: Normal heart sounds.   Pulmonary:     Effort: Pulmonary effort is normal. No respiratory distress.     Breath sounds: Normal breath sounds. No wheezing.  Abdominal:     General: Bowel sounds are normal.     Palpations: Abdomen is soft.  Musculoskeletal:     Cervical back: Neck supple.  Skin:    General: Skin is warm and dry.  Neurological:     Mental Status: She is alert and oriented to person, place, and time.  Psychiatric:        Mood and Affect: Mood normal.     ED Results / Procedures / Treatments   Labs (all labs ordered are listed, but only abnormal results are displayed) Labs Reviewed  CBC WITH DIFFERENTIAL/PLATELET - Abnormal; Notable for the following components:      Result Value   WBC 11.0 (*)    Abs Immature Granulocytes 0.09 (*)    All other components within normal limits  BASIC METABOLIC PANEL  TROPONIN I (HIGH SENSITIVITY)  TROPONIN I (HIGH SENSITIVITY)    EKG EKG Interpretation  Date/Time:  Monday May 19 2022 03:18:15 EDT Ventricular Rate:  71 PR Interval:  145 QRS Duration: 105 QT Interval:  414 QTC Calculation: 450 R Axis:   29 Text Interpretation: Sinus rhythm Low voltage, precordial leads Confirmed by Thayer Jew [phone]) on 05/19/2022 3:40:07 AM  Radiology DG Chest 2 View  Result Date: 05/19/2022 CLINICAL DATA:  Chest pain EXAM: CHEST - 2 VIEW COMPARISON:  05/12/2022 FINDINGS: The heart size and mediastinal contours are within normal limits. Both lungs are clear. The visualized skeletal structures are unremarkable. IMPRESSION: Normal study. Electronically Signed   By: Rolm Baptise M.D.   On: 05/19/2022 03:34    Procedures Procedures    Medications Ordered in ED Medications  ibuprofen (ADVIL) tablet 800 mg (800 mg Oral Given 05/19/22 0436)    ED Course/ Medical Decision Making/ A&P                             Medical Decision Making Amount and/or Complexity of Data Reviewed Labs: ordered. Radiology: ordered.  Risk Prescription drug  management.   This patient presents to the ED for concern of chest pain, this involves an extensive number of treatment options, and is a complaint that carries with it a high risk of complications and morbidity.  I considered the following differential and admission for this acute, potentially life threatening condition.  The differential diagnosis includes ACS, pneumonia, PE, pleurisy, chest wall pain  MDM:    This is a 32 year old female who presents with chest pain.  She is overall nontoxic and vital signs initially notable for blood pressure 166/92.  Repeat 122/77.  She is  satting 100% on room air.  Physical exam is benign.  Breath sounds are clear.  Chest x-ray shows resolved pneumonia.  No pneumothorax.  Basic lab work is reassuring.  EKG shows no evidence of acute arrhythmia or ischemia.  Troponin x 1 is negative.  Feel that this is sufficient given the ongoing nature of her pain.  She is PERC negative.  Suspect she may have some residual pleurisy related to recent pneumonia.  Will try anti-inflammatories.  (Labs, imaging, consults)  Labs: I Ordered, and personally interpreted labs.  The pertinent results include: CBC, BMP, troponin  Imaging Studies ordered: I ordered imaging studies including chest x-ray I independently visualized and interpreted imaging. I agree with the radiologist interpretation  Additional history obtained from chart review.  External records from outside source obtained and reviewed including urgent care notes  Cardiac Monitoring: The patient was maintained on a cardiac monitor.  If on the cardiac monitor, I personally viewed and interpreted the cardiac monitored which showed an underlying rhythm of: Sinus rhythm  Reevaluation: After the interventions noted above, I reevaluated the patient and found that they have :improved  Social Determinants of Health:  lives independently  Disposition: Discharge  Co morbidities that complicate the patient  evaluation  Past Medical History:  Diagnosis Date   Anxiety    Depression    Dysphagia    Left upper quadrant abdominal pain 07/11/2020   Nausea and vomiting 07/11/2020   Throat pain    Vitamin D deficiency      Medicines Meds ordered this encounter  Medications   ibuprofen (ADVIL) tablet 800 mg    I have reviewed the patients home medicines and have made adjustments as needed  Problem List / ED Course: Problem List Items Addressed This Visit   None Visit Diagnoses     Atypical chest pain    -  Primary                   Final Clinical Impression(s) / ED Diagnoses Final diagnoses:  Atypical chest pain    Rx / DC Orders ED Discharge Orders     None         Merryl Hacker, MD 05/19/22 [phone]

## 2022-05-27 ENCOUNTER — Ambulatory Visit
Admission: RE | Admit: 2022-05-27 | Discharge: 2022-05-27 | Disposition: A | Discharge status: Home / Self Care | Payer: 59 | Source: Ambulatory Visit | Attending: Family Medicine | Admitting: Family Medicine

## 2022-05-27 VITALS — BP 156/86 | HR 82 | Temp 98.9°F | Resp 17

## 2022-05-27 DIAGNOSIS — J4521 Mild intermittent asthma with (acute) exacerbation: Secondary | ICD-10-CM | POA: Diagnosis not present

## 2022-05-27 DIAGNOSIS — J069 Acute upper respiratory infection, unspecified: Secondary | ICD-10-CM | POA: Diagnosis not present

## 2022-05-27 MED ORDER — PREDNISONE 20 MG PO TABS: 40.0000 mg | ORAL_TABLET | Freq: Every day | ORAL | 0 refills | Status: DC

## 2022-05-27 MED ORDER — FLUCONAZOLE 150 MG PO TABS: 150.0000 mg | ORAL_TABLET | Freq: Every day | ORAL | 0 refills | Status: DC

## 2022-05-27 MED ORDER — AZITHROMYCIN 250 MG PO TABS: ORAL_TABLET | ORAL | 0 refills | Status: DC

## 2022-05-27 NOTE — ED Provider Notes (Signed)
[name]    CSN: [id number] Arrival date & time: 05/27/22  0843      History   Chief Complaint Chief Complaint  Patient presents with   Cough    APPT 9am    HPI Cynthia Frazier is a 32 y.o. female.   HPI Patient has a history of asthma and bronchitis.  She is here for cough and congestion that is going on for 2 days.  Chest tightness.  Wheezing.  She states she is using her inhaler more often than usual.  She is taking DayQuil and over-the-counter medication.  She did a COVID test at home that was negative.  She feels like she needs to be seen when she starts having a cough "before it gets worse".  I did explain to her that most coughs are caused by viruses and that treatment at home initially he is appropriate.   Past Medical History:  Diagnosis Date   Anxiety    Depression    Dysphagia    Left upper quadrant abdominal pain 07/11/2020   Nausea and vomiting 07/11/2020   Throat pain    Vitamin D deficiency     Patient Active Problem List   Diagnosis Date Noted   Vitamin D deficiency 06/11/2021   Asthma 06/04/2021   Obesity, morbid, BMI 40.0-49.9 ([place]) 06/04/2021   Onychomycosis 06/04/2021   Eczema, dyshidrotic 06/04/2021   S/P cholecystectomy 07/11/2020    Past Surgical History:  Procedure Laterality Date   CESAREAN SECTION     CHOLECYSTECTOMY      OB History   No obstetric history on file.      Home Medications    Prior to Admission medications   Medication Sig Start Date End Date Taking? Authorizing Provider  azithromycin (ZITHROMAX Z-PAK) 250 MG tablet Take two pills today followed by one a day until gone 05/27/22   Raylene Everts, MD  busPIRone (BUSPAR) 15 MG tablet Take 20 mg by mouth 3 (three) times daily.    [provider]  ciclopirox (PENLAC) 8 % solution Apply topically at bedtime. Apply over nail and surrounding skin. Apply daily over previous coat. After seven (7) days, may remove with alcohol and continue cycle.  06/04/21   Francis Gaines B, PA-C  ergocalciferol (VITAMIN D2) 1.25 MG (50000 UT) capsule  12/23/19   [provider]  fluconazole (DIFLUCAN) 150 MG tablet Take 1 tablet (150 mg total) by mouth daily. Repeat in 1 week if needed 05/27/22   Raylene Everts, MD  fluticasone [facility]) 50 MCG/ACT nasal spray Place 2 sprays into both nostrils daily. 04/08/22   Apolonio Schneiders, FNP  lamoTRIgine (LAMICTAL) 25 MG tablet TAKE 2 TABLETS BY MOUTH DAILY 12/03/21   Denita Lung, MD  midodrine (PROAMATINE) 10 MG tablet Take 10 mg by mouth 3 (three) times daily.    [provider]  montelukast (SINGULAIR) 10 MG tablet TAKE 1 TABLET BY MOUTH AT BEDTIME 01/13/22   Kennith Gain, MD  Olopatadine HCl 0.2 % SOLN Apply 1 drop to eye 2 (two) times daily. 07/18/21   Kennith Gain, MD  Olopatadine-Mometasone [facility]low[facility]) [phone] MCG/ACT SUSP Place 2 sprays into the nose 2 (two) times daily. 07/18/21   Kennith Gain, MD  ondansetron (ZOFRAN) 4 MG tablet Take 1 tablet (4 mg total) by mouth every 8 (eight) hours as needed for nausea or vomiting. 11/06/21   Tysinger, Camelia Eng, PA-C  predniSONE (DELTASONE) 20 MG tablet Take 2 tablets (40 mg total) by mouth  daily with breakfast. 05/27/22   Raylene Everts, MD  sertraline (ZOLOFT) 100 MG tablet TAKE 1 TABLET BY MOUTH DAILY 12/03/21   Denita Lung, MD    Family History Family History  Adopted: Yes    Social History Social History   Tobacco Use   Smoking status: Never   Smokeless tobacco: Never  Vaping Use   Vaping Use: Never used  Substance Use Topics   Alcohol use: Yes    Comment: 2 times/week   Drug use: Never     Allergies   Sumatriptan, Vancomycin, Latex, Avocado, Banana, Kiwi extract, and Levofloxacin   Review of Systems Review of Systems See HPI Physical Exam Triage Vital Signs ED Triage Vitals  Enc Vitals Group     BP 05/27/22 0947 (!) 156/86     Pulse Rate 05/27/22 0947 82     Resp 05/27/22 0947  17     Temp 05/27/22 0947 98.9 F (37.2 C)     Temp Source 05/27/22 0947 Oral     SpO2 05/27/22 0947 99 %     Weight --      Height --      Head Circumference --      Peak Flow --      Pain Score 05/27/22 0949 0     Pain Loc --      Pain Edu? --      Excl. in [place]? --    No data found.  Updated Vital Signs BP (!) 156/86 (BP Location: Right Arm)   Pulse 82   Temp 98.9 F (37.2 C) (Oral)   Resp 17   SpO2 99%       Physical Exam Constitutional:      General: She is not in acute distress.    Appearance: She is well-developed. She is obese. She is ill-appearing.  HENT:     Head: Normocephalic and atraumatic.     Right Ear: Tympanic membrane and ear canal normal.     Left Ear: Tympanic membrane and ear canal normal.     Nose: Congestion and rhinorrhea present.     Mouth/Throat:     Mouth: Mucous membranes are moist.     Pharynx: Posterior oropharyngeal erythema present.     Comments: Mild erythema Eyes:     Conjunctiva/sclera: Conjunctivae normal.     Pupils: Pupils are equal, round, and reactive to light.  Cardiovascular:     Rate and Rhythm: Normal rate and regular rhythm.     Heart sounds: Normal heart sounds.  Pulmonary:     Effort: Pulmonary effort is normal. No respiratory distress.     Breath sounds: Wheezing present.     Comments: Few scattered wheezes throughout Abdominal:     General: There is no distension.     Palpations: Abdomen is soft.  Musculoskeletal:        General: Normal range of motion.     Cervical back: Normal range of motion.  Lymphadenopathy:     Cervical: No cervical adenopathy.  Skin:    General: Skin is warm and dry.  Neurological:     Mental Status: She is alert.      UC Treatments / Results  Labs (all labs ordered are listed, but only abnormal results are displayed) Labs Reviewed - No data to display  EKG   Radiology No results found.  Procedures Procedures (including critical care time)  Medications Ordered in  UC Medications - No data to display  Initial Impression / Assessment  and Plan / UC Course  I have reviewed the triage vital signs and the nursing notes.  Pertinent labs & imaging results that were available during my care of the patient were reviewed by me and considered in my medical decision making (see chart for details).     Patient is given a Z-Pak to fill and take if she fails to see improvement over the next several days, or if she has worsening of symptoms.  Can use the prednisone now. Final Clinical Impressions(s) / UC Diagnoses   Final diagnoses:  Viral URI with cough  Mild intermittent asthma with exacerbation     Discharge Instructions      Drink lots of fluids Run a humidifier Use your inhaler as needed Take Z-Pak as directed Take prednisone daily for 5 days I have refilled your Diflucan in case it is needed Call for problems   ED Prescriptions     Medication Sig Dispense Auth. Provider   predniSONE (DELTASONE) 20 MG tablet Take 2 tablets (40 mg total) by mouth daily with breakfast. 10 tablet Raylene Everts, MD   azithromycin (ZITHROMAX Z-PAK) 250 MG tablet Take two pills today followed by one a day until gone 6 tablet Raylene Everts, MD   fluconazole (DIFLUCAN) 150 MG tablet Take 1 tablet (150 mg total) by mouth daily. Repeat in 1 week if needed 2 tablet Raylene Everts, MD      PDMP not reviewed this encounter.   Raylene Everts, MD 05/27/22 [phone]

## 2022-05-27 NOTE — Discharge Instructions (Signed)
Drink lots of fluids Run a humidifier Use your inhaler as needed Take Z-Pak as directed Take prednisone daily for 5 days I have refilled your Diflucan in case it is needed Call for problems

## 2022-05-27 NOTE — ED Triage Notes (Addendum)
Pt c/o cough and congestion x 2 days. Some chest tightness and wheezing. Hx of asthma and bronchitis. Taking dayquil and inhaler prn. COVID test neg at home this am.

## 2022-05-28 DIAGNOSIS — R400 Somnolence: Secondary | ICD-10-CM | POA: Diagnosis not present

## 2022-05-28 DIAGNOSIS — G4733 Obstructive sleep apnea (adult) (pediatric): Secondary | ICD-10-CM | POA: Diagnosis not present

## 2022-05-29 ENCOUNTER — Ambulatory Visit (HOSPITAL_COMMUNITY): Payer: Self-pay

## 2022-06-13 ENCOUNTER — Encounter: Payer: 59 | Admitting: Physician Assistant

## 2022-06-19 DIAGNOSIS — D2239 Melanocytic nevi of other parts of face: Secondary | ICD-10-CM | POA: Diagnosis not present

## 2022-06-19 DIAGNOSIS — D492 Neoplasm of unspecified behavior of bone, soft tissue, and skin: Secondary | ICD-10-CM | POA: Diagnosis not present

## 2022-06-19 DIAGNOSIS — D225 Melanocytic nevi of trunk: Secondary | ICD-10-CM | POA: Diagnosis not present

## 2022-06-19 DIAGNOSIS — L301 Dyshidrosis [pompholyx]: Secondary | ICD-10-CM | POA: Diagnosis not present

## 2022-06-23 DIAGNOSIS — S21002A Unspecified open wound of left breast, initial encounter: Secondary | ICD-10-CM | POA: Diagnosis not present

## 2022-06-28 DIAGNOSIS — G4733 Obstructive sleep apnea (adult) (pediatric): Secondary | ICD-10-CM | POA: Diagnosis not present

## 2022-06-28 DIAGNOSIS — R400 Somnolence: Secondary | ICD-10-CM | POA: Diagnosis not present

## 2022-07-16 DIAGNOSIS — F4322 Adjustment disorder with anxiety: Secondary | ICD-10-CM | POA: Diagnosis not present

## 2022-07-27 ENCOUNTER — Ambulatory Visit: Payer: Self-pay

## 2022-07-28 DIAGNOSIS — R400 Somnolence: Secondary | ICD-10-CM | POA: Diagnosis not present

## 2022-07-28 DIAGNOSIS — G4733 Obstructive sleep apnea (adult) (pediatric): Secondary | ICD-10-CM | POA: Diagnosis not present

## 2022-08-13 DIAGNOSIS — F4322 Adjustment disorder with anxiety: Secondary | ICD-10-CM | POA: Diagnosis not present

## 2022-08-28 DIAGNOSIS — G4733 Obstructive sleep apnea (adult) (pediatric): Secondary | ICD-10-CM | POA: Diagnosis not present

## 2022-08-28 DIAGNOSIS — R400 Somnolence: Secondary | ICD-10-CM | POA: Diagnosis not present

## 2022-09-02 ENCOUNTER — Encounter: Payer: Self-pay | Admitting: Family Medicine

## 2022-10-08 DIAGNOSIS — F4322 Adjustment disorder with anxiety: Secondary | ICD-10-CM | POA: Diagnosis not present

## 2022-10-16 ENCOUNTER — Ambulatory Visit: Payer: Self-pay

## 2022-11-05 ENCOUNTER — Ambulatory Visit
Admission: RE | Admit: 2022-11-05 | Discharge: 2022-11-05 | Disposition: A | Discharge status: Home / Self Care | Payer: 59 | Source: Ambulatory Visit | Attending: Urgent Care | Admitting: Urgent Care

## 2022-11-05 ENCOUNTER — Ambulatory Visit (INDEPENDENT_AMBULATORY_CARE_PROVIDER_SITE_OTHER): Payer: 59

## 2022-11-05 VITALS — BP 143/87 | HR 88 | Temp 99.0°F | Resp 18

## 2022-11-05 DIAGNOSIS — F4322 Adjustment disorder with anxiety: Secondary | ICD-10-CM | POA: Diagnosis not present

## 2022-11-05 DIAGNOSIS — M7989 Other specified soft tissue disorders: Secondary | ICD-10-CM | POA: Diagnosis not present

## 2022-11-05 DIAGNOSIS — F332 Major depressive disorder, recurrent severe without psychotic features: Secondary | ICD-10-CM | POA: Diagnosis not present

## 2022-11-05 DIAGNOSIS — F410 Panic disorder [episodic paroxysmal anxiety] without agoraphobia: Secondary | ICD-10-CM | POA: Diagnosis not present

## 2022-11-05 DIAGNOSIS — M79642 Pain in left hand: Secondary | ICD-10-CM

## 2022-11-05 DIAGNOSIS — S60212A Contusion of left wrist, initial encounter: Secondary | ICD-10-CM | POA: Diagnosis not present

## 2022-11-05 DIAGNOSIS — F4312 Post-traumatic stress disorder, chronic: Secondary | ICD-10-CM | POA: Diagnosis not present

## 2022-11-05 DIAGNOSIS — M25532 Pain in left wrist: Secondary | ICD-10-CM | POA: Diagnosis not present

## 2022-11-05 DIAGNOSIS — S60222A Contusion of left hand, initial encounter: Secondary | ICD-10-CM | POA: Diagnosis not present

## 2022-11-05 MED ORDER — NAPROXEN 500 MG PO TABS: 500.0000 mg | ORAL_TABLET | Freq: Two times a day (BID) | ORAL | 0 refills | Status: DC

## 2022-11-05 NOTE — ED Triage Notes (Signed)
Pt reports she fell and caught herself with the left hand last night. Pt reports pain and swelling in left hand and left wrist. Ibuprofen gives relief.

## 2022-11-05 NOTE — Discharge Instructions (Addendum)
I will update you with your x-ray results including any changes to her management and diagnosis.  For now, I recommend that you do icing for 20 minutes every 2 hours.  Use naproxen for pain and inflammation.

## 2022-11-05 NOTE — ED Provider Notes (Signed)
[facility]  Note:  This document was prepared using Conservation officer, historic buildings and may include unintentional dictation errors.  MRN: [id number] DOB: Aug 12, 1990  Subjective:   Cynthia Frazier is a 32 y.o. female presenting for 1 day history of acute onset left wrist, left hand pain from falling on a mostly out-stretched hand. Had significant swelling that has improved this morning but still wanted to be evaluated. Has slight tingling in the hand, fingers (2nd-3rd).  No ecchymosis, bony deformity.  No current facility-administered medications for this encounter.  Current Outpatient Medications:    amoxicillin (AMOXIL) 500 MG capsule, Take 500 mg by mouth 3 (three) times daily., Disp: , Rfl:    amoxicillin-clavulanate (AUGMENTIN) 500-125 MG tablet, Take by mouth., Disp: , Rfl:    azithromycin (ZITHROMAX Z-PAK) 250 MG tablet, Take two pills today followed by one a day until gone, Disp: 6 tablet, Rfl: 0   busPIRone (BUSPAR) 15 MG tablet, Take 20 mg by mouth 3 (three) times daily., Disp: , Rfl:    ciclopirox (PENLAC) 8 % solution, Apply topically at bedtime. Apply over nail and surrounding skin. Apply daily over previous coat. After seven (7) days, may remove with alcohol and continue cycle., Disp: 6.6 mL, Rfl: 0   ergocalciferol (VITAMIN D2) 1.25 MG (50000 UT) capsule, , Disp: , Rfl:    Ferrous Sulfate (IRON PO), Take by mouth., Disp: , Rfl:    fluconazole (DIFLUCAN) 150 MG tablet, Take 1 tablet (150 mg total) by mouth daily. Repeat in 1 week if needed, Disp: 2 tablet, Rfl: 0   fluticasone (FLONASE) 50 MCG/ACT nasal spray, Place 2 sprays into both nostrils daily., Disp: 16 g, Rfl: 6   ibuprofen (ADVIL) 400 MG tablet, Take by mouth., Disp: , Rfl:    lamoTRIgine (LAMICTAL) 25 MG tablet, TAKE 2 TABLETS BY MOUTH DAILY, Disp: 180 tablet, Rfl: 0   MIDODRINE HCL PO, Take by mouth., Disp: , Rfl:    montelukast (SINGULAIR) 10 MG tablet, TAKE 1 TABLET BY MOUTH AT BEDTIME,  Disp: 30 tablet, Rfl: 0   Olopatadine HCl 0.2 % SOLN, Apply 1 drop to eye 2 (two) times daily., Disp: 2.5 mL, Rfl: 5   Olopatadine-Mometasone (RYALTRIS) 665-25 MCG/ACT SUSP, Place 2 sprays into the nose 2 (two) times daily., Disp: 29 g, Rfl: 5   predniSONE (DELTASONE) 20 MG tablet, Take 2 tablets (40 mg total) by mouth daily with breakfast., Disp: 10 tablet, Rfl: 0   sertraline (ZOLOFT) 100 MG tablet, TAKE 1 TABLET BY MOUTH DAILY, Disp: 90 tablet, Rfl: 0   sertraline (ZOLOFT) 100 MG tablet, Take by mouth., Disp: , Rfl:    Allergies  Allergen Reactions   Latex Anaphylaxis, Hives and Other (See Comments)   Sumatriptan Anaphylaxis and Hives   Vancomycin Anaphylaxis and Itching   Avocado Rash   Banana Rash   Kiwi Extract Rash   Levofloxacin Rash    Other Reaction(s): Bradycardia    Past Medical History:  Diagnosis Date   Anxiety    Depression    Dysphagia    Left upper quadrant abdominal pain 07/11/2020   Nausea and vomiting 07/11/2020   Throat pain    Vitamin D deficiency      Past Surgical History:  Procedure Laterality Date   CESAREAN SECTION     CHOLECYSTECTOMY      Family History  Adopted: Yes    Social History   Tobacco Use   Smoking status: Never   Smokeless tobacco: Never  Vaping  Use   Vaping status: Never Used  Substance Use Topics   Alcohol use: Yes    Comment: 2 times/week   Drug use: Never    ROS   Objective:   Vitals: BP (!) 143/87 (BP Location: Right Arm)   Pulse 88   Temp 99 F (37.2 C) (Oral)   Resp 18   SpO2 98%   Physical Exam Constitutional:      General: She is not in acute distress.    Appearance: Normal appearance. She is well-developed. She is not ill-appearing, toxic-appearing or diaphoretic.  HENT:     Head: Normocephalic and atraumatic.     Nose: Nose normal.     Mouth/Throat:     Mouth: Mucous membranes are moist.  Eyes:     General: No scleral icterus.       Right eye: No discharge.        Left eye: No discharge.      Extraocular Movements: Extraocular movements intact.  Cardiovascular:     Rate and Rhythm: Normal rate.  Pulmonary:     Effort: Pulmonary effort is normal.  Musculoskeletal:     Left wrist: Swelling, tenderness and bony tenderness present. No deformity, effusion, lacerations, snuff box tenderness or crepitus. Decreased range of motion.     Left hand: Swelling (trace radial aspect), tenderness (over areas outlined) and bony tenderness present. No deformity or lacerations. Normal range of motion. Normal strength. Normal sensation. Normal capillary refill.       Hands:  Skin:    General: Skin is warm and dry.  Neurological:     General: No focal deficit present.     Mental Status: She is alert and oriented to person, place, and time.  Psychiatric:        Mood and Affect: Mood normal.        Behavior: Behavior normal.    I applied a 2" Ace wrap and thumb spica fashion to the left wrist and hand.  Assessment and Plan :   PDMP not reviewed this encounter.  1. Left wrist pain   2. Contusion of left wrist, initial encounter   3. Contusion of left hand, initial encounter   4. Left hand pain    Patient opted to not wait for the radiology over read.  X-ray over-read was pending at time of discharge, recommended follow up with only abnormal results. Otherwise will not call for negative over-read. Patient was in agreement.  Recommended conservative management for left hand and wrist contusion.  Use RICE method, naproxen for pain and inflammation. Counseled patient on potential for adverse effects with medications prescribed/recommended today, ER and return-to-clinic precautions discussed, patient verbalized understanding.    Cynthia Frazier, [place] 11/05/22 [phone]

## 2022-11-19 DIAGNOSIS — F4312 Post-traumatic stress disorder, chronic: Secondary | ICD-10-CM | POA: Diagnosis not present

## 2022-11-19 DIAGNOSIS — F410 Panic disorder [episodic paroxysmal anxiety] without agoraphobia: Secondary | ICD-10-CM | POA: Diagnosis not present

## 2022-11-19 DIAGNOSIS — F332 Major depressive disorder, recurrent severe without psychotic features: Secondary | ICD-10-CM | POA: Diagnosis not present

## 2022-12-17 DIAGNOSIS — F4310 Post-traumatic stress disorder, unspecified: Secondary | ICD-10-CM | POA: Diagnosis not present

## 2022-12-19 ENCOUNTER — Ambulatory Visit
Admission: RE | Admit: 2022-12-19 | Discharge: 2022-12-19 | Disposition: A | Discharge status: Home / Self Care | Payer: 59 | Source: Ambulatory Visit | Attending: Family Medicine | Admitting: Family Medicine

## 2022-12-19 VITALS — BP 106/73 | HR 67 | Temp 98.7°F | Resp 17

## 2022-12-19 DIAGNOSIS — R112 Nausea with vomiting, unspecified: Secondary | ICD-10-CM

## 2022-12-19 DIAGNOSIS — R197 Diarrhea, unspecified: Secondary | ICD-10-CM

## 2022-12-19 MED ORDER — ONDANSETRON HCL 4 MG/2ML IJ SOLN
4.0000 mg | Freq: Once | INTRAMUSCULAR | Status: AC
  Administered 2022-12-19: 4 mg via INTRAMUSCULAR

## 2022-12-19 MED ORDER — ONDANSETRON 4 MG PO TBDP: 4.0000 mg | ORAL_TABLET | Freq: Three times a day (TID) | ORAL | 0 refills | Status: DC | PRN

## 2022-12-19 NOTE — ED Triage Notes (Signed)
Pt presents with nausea x 2 days. Pt states she has not been able to keep food down and liquids.   Pt states she has diarrhea and denies abd pain.

## 2022-12-19 NOTE — ED Provider Notes (Signed)
[facility]    CSN: [id number] Arrival date & time: 12/19/22  0849      History   Chief Complaint Chief Complaint  Patient presents with   Nausea    Entered by patient   Appointment    HPI Cynthia Frazier is a 32 y.o. female.   Patient is here for n/v.  Yesterday morning she and her son woke up vomiting about 2am.  She continues to have n/v, along with diarrhea.  Some chills, no fevers.  No abdominal pain.  She is able to drink small amount of liquids.        Past Medical History:  Diagnosis Date   Anxiety    Depression    Dysphagia    Left upper quadrant abdominal pain 07/11/2020   Nausea and vomiting 07/11/2020   Throat pain    Vitamin D deficiency     Patient Active Problem List   Diagnosis Date Noted   Vitamin D deficiency 06/11/2021   Asthma 06/04/2021   Obesity, morbid, BMI 40.0-49.9 (HCC) 06/04/2021   Onychomycosis 06/04/2021   Eczema, dyshidrotic 06/04/2021   S/P cholecystectomy 07/11/2020   Postpartum exam 08/31/2015    Past Surgical History:  Procedure Laterality Date   CESAREAN SECTION     CHOLECYSTECTOMY      OB History   No obstetric history on file.      Home Medications    Prior to Admission medications   Medication Sig Start Date End Date Taking? Authorizing Provider  amoxicillin (AMOXIL) 500 MG capsule Take 500 mg by mouth 3 (three) times daily. 03/27/22   [provider]  amoxicillin-clavulanate (AUGMENTIN) 500-125 MG tablet Take by mouth. 03/17/22   [provider]  azithromycin (ZITHROMAX Z-PAK) 250 MG tablet Take two pills today followed by one a day until gone 05/27/22   Eustace Moore, MD  busPIRone (BUSPAR) 15 MG tablet Take 20 mg by mouth 3 (three) times daily.    [provider]  ciclopirox (PENLAC) 8 % solution Apply topically at bedtime. Apply over nail and surrounding skin. Apply daily over previous coat. After seven (7) days, may remove with alcohol and continue cycle. 06/04/21    Burnard Hawthorne B, PA-C  ergocalciferol (VITAMIN D2) 1.25 MG (50000 UT) capsule  12/23/19   [provider]  Ferrous Sulfate (IRON PO) Take by mouth.    [provider]  fluconazole (DIFLUCAN) 150 MG tablet Take 1 tablet (150 mg total) by mouth daily. Repeat in 1 week if needed 05/27/22   Eustace Moore, MD  fluticasone [facility]) 50 MCG/ACT nasal spray Place 2 sprays into both nostrils daily. 04/08/22   Viviano Simas, FNP  ibuprofen (ADVIL) 400 MG tablet Take by mouth. 07/18/15   [provider]  lamoTRIgine (LAMICTAL) 25 MG tablet TAKE 2 TABLETS BY MOUTH DAILY 12/03/21   Ronnald Nian, MD  MIDODRINE HCL PO Take by mouth.    [provider]  montelukast (SINGULAIR) 10 MG tablet TAKE 1 TABLET BY MOUTH AT BEDTIME 01/13/22   Marcelyn Bruins, MD  naproxen (NAPROSYN) 500 MG tablet Take 1 tablet (500 mg total) by mouth 2 (two) times daily with a meal. 11/05/22   [name], PA-C  Olopatadine HCl 0.2 % SOLN Apply 1 drop to eye 2 (two) times daily. 07/18/21   Marcelyn Bruins, MD  Olopatadine-Mometasone [facility]) [phone] MCG/ACT SUSP Place 2 sprays into the nose 2 (two) times daily. 07/18/21   Marcelyn Bruins, MD  predniSONE (DELTASONE)  20 MG tablet Take 2 tablets (40 mg total) by mouth daily with breakfast. 05/27/22   Eustace Moore, MD  sertraline (ZOLOFT) 100 MG tablet TAKE 1 TABLET BY MOUTH DAILY 12/03/21   Ronnald Nian, MD  sertraline (ZOLOFT) 100 MG tablet Take by mouth.    [provider]    Family History Family History  Adopted: Yes    Social History Social History   Tobacco Use   Smoking status: Never   Smokeless tobacco: Never  Vaping Use   Vaping status: Never Used  Substance Use Topics   Alcohol use: Yes    Comment: 2 times/week   Drug use: Never     Allergies   Latex, Sumatriptan, Vancomycin, Avocado, Banana, Kiwi extract, and Levofloxacin   Review of Systems Review of Systems   Constitutional: Negative.   HENT: Negative.    Respiratory: Negative.    Cardiovascular: Negative.   Gastrointestinal:  Positive for diarrhea, nausea and vomiting. Negative for abdominal pain.  Musculoskeletal: Negative.   Skin: Negative.   Psychiatric/Behavioral: Negative.       Physical Exam Triage Vital Signs ED Triage Vitals [12/19/22 0900]  Encounter Vitals Group     BP 106/73     Systolic BP Percentile      Diastolic BP Percentile      Pulse Rate 67     Resp 17     Temp 98.7 F (37.1 C)     Temp Source Oral     SpO2 97 %     Weight      Height      Head Circumference      Peak Flow      Pain Score 5     Pain Loc      Pain Education      Exclude from Growth Chart    No data found.  Updated Vital Signs BP 106/73 (BP Location: Right Arm)   Pulse 67   Temp 98.7 F (37.1 C) (Oral)   Resp 17   SpO2 97%   Visual Acuity Right Eye Distance:   Left Eye Distance:   Bilateral Distance:    Right Eye Near:   Left Eye Near:    Bilateral Near:     Physical Exam Constitutional:      General: She is not in acute distress.    Appearance: Normal appearance. She is not ill-appearing.  HENT:     Mouth/Throat:     Mouth: Mucous membranes are moist.  Cardiovascular:     Rate and Rhythm: Normal rate and regular rhythm.  Pulmonary:     Effort: Pulmonary effort is normal.     Breath sounds: Normal breath sounds.  Abdominal:     General: Bowel sounds are normal.     Palpations: Abdomen is soft.     Tenderness: There is no abdominal tenderness. There is no guarding or rebound.  Musculoskeletal:     Cervical back: Normal range of motion and neck supple.  Neurological:     General: No focal deficit present.     Mental Status: She is alert.  Psychiatric:        Mood and Affect: Mood normal.      UC Treatments / Results  Labs (all labs ordered are listed, but only abnormal results are displayed) Labs Reviewed - No data to display  EKG   Radiology No  results found.  Procedures Procedures (including critical care time)  Medications Ordered in UC Medications  ondansetron (ZOFRAN)  injection 4 mg (has no administration in time range)    Initial Impression / Assessment and Plan / UC Course  I have reviewed the triage vital signs and the nursing notes.  Pertinent labs & imaging results that were available during my care of the patient were reviewed by me and considered in my medical decision making (see chart for details).   Final Clinical Impressions(s) / UC Diagnoses   Final diagnoses:  Nausea and vomiting, unspecified vomiting type  Diarrhea, unspecified type     Discharge Instructions      You were seen today for nausea and vomiting.  You were given zofran in the office and I have sent a script to your pharmacy as well. Please drink small amounts of clear liquids at a time to stay hydrated.  If you continue with nausea and vomiting despite medication then please go to the ER for further evaluation.      ED Prescriptions     Medication Sig Dispense Auth. Provider   ondansetron (ZOFRAN-ODT) 4 MG disintegrating tablet Take 1 tablet (4 mg total) by mouth every 8 (eight) hours as needed for nausea or vomiting. 20 tablet Jannifer Franklin, MD      PDMP not reviewed this encounter.   Jannifer Franklin, MD 12/19/22 [phone]

## 2022-12-19 NOTE — Discharge Instructions (Signed)
You were seen today for nausea and vomiting.  You were given zofran in the office and I have sent a script to your pharmacy as well. Please drink small amounts of clear liquids at a time to stay hydrated.  If you continue with nausea and vomiting despite medication then please go to the ER for further evaluation.

## 2022-12-22 NOTE — Plan of Care (Signed)
[facility] Tonsillectomy/Adenoidectomy, Postoperative PEDS care plan entered in error.

## 2022-12-29 ENCOUNTER — Telehealth: Payer: Self-pay

## 2022-12-29 NOTE — Telephone Encounter (Signed)
Pt hasn't been in over a year. Pt will need an appt first.

## 2022-12-29 NOTE — Telephone Encounter (Signed)
Faxed request for prazosin 5 mg

## 2022-12-29 NOTE — Telephone Encounter (Signed)
Called pt, no answer, left voicemail.

## 2023-01-06 ENCOUNTER — Telehealth: Payer: Self-pay

## 2023-01-06 NOTE — Telephone Encounter (Signed)
She still needs a visit as it been over a year

## 2023-01-06 NOTE — Telephone Encounter (Signed)
Faxed request for prazosin 5mg  but it is historical.

## 2023-01-06 NOTE — Telephone Encounter (Signed)
Pt has not been in, in over a year. She is overdue for a visit. She must been seen before we refill this

## 2023-01-28 DIAGNOSIS — F410 Panic disorder [episodic paroxysmal anxiety] without agoraphobia: Secondary | ICD-10-CM | POA: Diagnosis not present

## 2023-01-28 DIAGNOSIS — Z0000 Encounter for general adult medical examination without abnormal findings: Secondary | ICD-10-CM | POA: Diagnosis not present

## 2023-01-28 DIAGNOSIS — Z124 Encounter for screening for malignant neoplasm of cervix: Secondary | ICD-10-CM | POA: Diagnosis not present

## 2023-01-28 DIAGNOSIS — F4312 Post-traumatic stress disorder, chronic: Secondary | ICD-10-CM | POA: Diagnosis not present

## 2023-01-28 DIAGNOSIS — F332 Major depressive disorder, recurrent severe without psychotic features: Secondary | ICD-10-CM | POA: Diagnosis not present

## 2023-02-23 ENCOUNTER — Encounter (HOSPITAL_BASED_OUTPATIENT_CLINIC_OR_DEPARTMENT_OTHER): Payer: Self-pay | Admitting: Emergency Medicine

## 2023-02-23 DIAGNOSIS — R52 Pain, unspecified: Secondary | ICD-10-CM | POA: Diagnosis not present

## 2023-02-23 DIAGNOSIS — R509 Fever, unspecified: Secondary | ICD-10-CM | POA: Diagnosis not present

## 2023-02-23 DIAGNOSIS — R0602 Shortness of breath: Secondary | ICD-10-CM | POA: Insufficient documentation

## 2023-02-23 DIAGNOSIS — R059 Cough, unspecified: Secondary | ICD-10-CM | POA: Diagnosis not present

## 2023-02-23 DIAGNOSIS — R0981 Nasal congestion: Secondary | ICD-10-CM | POA: Insufficient documentation

## 2023-02-23 DIAGNOSIS — J111 Influenza due to unidentified influenza virus with other respiratory manifestations: Secondary | ICD-10-CM | POA: Diagnosis not present

## 2023-02-23 DIAGNOSIS — Z91040 Latex allergy status: Secondary | ICD-10-CM | POA: Diagnosis not present

## 2023-02-23 DIAGNOSIS — Z20822 Contact with and (suspected) exposure to covid-19: Secondary | ICD-10-CM | POA: Insufficient documentation

## 2023-02-23 DIAGNOSIS — J45909 Unspecified asthma, uncomplicated: Secondary | ICD-10-CM | POA: Insufficient documentation

## 2023-02-23 LAB — RESP PANEL BY RT-PCR (RSV, FLU A&B, COVID)  RVPGX2
Influenza A by PCR: NEGATIVE
Influenza B by PCR: NEGATIVE
Resp Syncytial Virus by PCR: NEGATIVE
SARS Coronavirus 2 by RT PCR: NEGATIVE

## 2023-02-23 NOTE — ED Triage Notes (Signed)
Cough, uri symptoms since Thursday Hx asthma

## 2023-02-24 ENCOUNTER — Emergency Department (HOSPITAL_BASED_OUTPATIENT_CLINIC_OR_DEPARTMENT_OTHER)
Admission: EM | Admit: 2023-02-24 | Discharge: 2023-02-24 | Disposition: A | Discharge status: Home / Self Care | Payer: 59 | Attending: Emergency Medicine | Admitting: Emergency Medicine

## 2023-02-24 ENCOUNTER — Emergency Department (HOSPITAL_BASED_OUTPATIENT_CLINIC_OR_DEPARTMENT_OTHER): Payer: 59 | Admitting: Radiology

## 2023-02-24 DIAGNOSIS — R509 Fever, unspecified: Secondary | ICD-10-CM | POA: Diagnosis not present

## 2023-02-24 DIAGNOSIS — J111 Influenza due to unidentified influenza virus with other respiratory manifestations: Secondary | ICD-10-CM

## 2023-02-24 DIAGNOSIS — R059 Cough, unspecified: Secondary | ICD-10-CM | POA: Diagnosis not present

## 2023-02-24 MED ORDER — PREDNISONE 50 MG PO TABS
60.0000 mg | ORAL_TABLET | Freq: Once | ORAL | Status: AC
  Administered 2023-02-24: 60 mg via ORAL
  Filled 2023-02-24: qty 1

## 2023-02-24 MED ORDER — PREDNISONE 50 MG PO TABS: 50.0000 mg | ORAL_TABLET | Freq: Every day | ORAL | 0 refills | Status: DC

## 2023-02-24 NOTE — Discharge Instructions (Addendum)
Continue to use your inhaler as needed.  Drink plenty of fluids.  Take acetaminophen and/or ibuprofen as needed for fever or aching.  Return to the emergency department for new or concerning symptoms.

## 2023-02-24 NOTE — ED Notes (Signed)
 RN reviewed discharge instructions with pt. Pt verbalized understanding and had no further questions. VSS upon discharge.  

## 2023-02-24 NOTE — ED Provider Notes (Signed)
[place] EMERGENCY DEPARTMENT AT [facility] Provider Note   CSN: [id number] Arrival date & time: 02/23/23  2059     History  Chief Complaint  Patient presents with   Cough    Cynthia Frazier is a 32 y.o. female.  The history is provided by the patient.  Cough She started having what she thought was a URI about 4 days ago with nasal congestion and sore throat.  She has had fever which has been as high as 102.4 and is also had chills and sweats.  Yesterday, she started having a cough productive of brownish sputum and she is concerned that she may develop pneumonia.  She does endorse some shortness of breath and she endorses body aches.  She denies any sick contacts.  She has been using her asthma inhaler which has been helping with her breathing.   Home Medications Prior to Admission medications   Medication Sig Start Date End Date Taking? Authorizing Provider  amoxicillin (AMOXIL) 500 MG capsule Take 500 mg by mouth 3 (three) times daily. 03/27/22   [provider]  amoxicillin-clavulanate (AUGMENTIN) 500-125 MG tablet Take by mouth. 03/17/22   [provider]  azithromycin (ZITHROMAX Z-PAK) 250 MG tablet Take two pills today followed by one a day until gone 05/27/22   Eustace Moore, MD  busPIRone (BUSPAR) 15 MG tablet Take 20 mg by mouth 3 (three) times daily.    [provider]  ciclopirox (PENLAC) 8 % solution Apply topically at bedtime. Apply over nail and surrounding skin. Apply daily over previous coat. After seven (7) days, may remove with alcohol and continue cycle. 06/04/21   Burnard Hawthorne B, PA-C  ergocalciferol (VITAMIN D2) 1.25 MG (50000 UT) capsule  12/23/19   [provider]  Ferrous Sulfate (IRON PO) Take by mouth.    [provider]  fluconazole (DIFLUCAN) 150 MG tablet Take 1 tablet (150 mg total) by mouth daily. Repeat in 1 week if needed 05/27/22   Eustace Moore, MD  fluticasone [facility]) 50 MCG/ACT  nasal spray Place 2 sprays into both nostrils daily. 04/08/22   Viviano Simas, FNP  ibuprofen (ADVIL) 400 MG tablet Take by mouth. 07/18/15   [provider]  lamoTRIgine (LAMICTAL) 25 MG tablet TAKE 2 TABLETS BY MOUTH DAILY 12/03/21   Ronnald Nian, MD  MIDODRINE HCL PO Take by mouth.    [provider]  montelukast (SINGULAIR) 10 MG tablet TAKE 1 TABLET BY MOUTH AT BEDTIME 01/13/22   Marcelyn Bruins, MD  naproxen (NAPROSYN) 500 MG tablet Take 1 tablet (500 mg total) by mouth 2 (two) times daily with a meal. 11/05/22   [name], PA-C  Olopatadine HCl 0.2 % SOLN Apply 1 drop to eye 2 (two) times daily. 07/18/21   Marcelyn Bruins, MD  Olopatadine-Mometasone [facility]) [phone] MCG/ACT SUSP Place 2 sprays into the nose 2 (two) times daily. 07/18/21   Marcelyn Bruins, MD  ondansetron (ZOFRAN-ODT) 4 MG disintegrating tablet Take 1 tablet (4 mg total) by mouth every 8 (eight) hours as needed for nausea or vomiting. 12/19/22   Piontek, Denny Peon, MD  predniSONE (DELTASONE) 20 MG tablet Take 2 tablets (40 mg total) by mouth daily with breakfast. 05/27/22   Eustace Moore, MD  sertraline (ZOLOFT) 100 MG tablet TAKE 1 TABLET BY MOUTH DAILY 12/03/21   Ronnald Nian, MD  sertraline (ZOLOFT) 100 MG tablet Take by mouth.    [provider]      Allergies  Latex, Sumatriptan, Vancomycin, Avocado, Banana, Kiwi extract, and Levofloxacin    Review of Systems   Review of Systems  Respiratory:  Positive for cough.   All other systems reviewed and are negative.   Physical Exam Updated Vital Signs BP (!) 110/57   Pulse 78   Temp 98.4 F (36.9 C) (Oral)   Resp 20   SpO2 100%  Physical Exam Vitals and nursing note reviewed.   32 year old female, resting comfortably and in no acute distress. Vital signs are normal. Oxygen saturation is 100%, which is normal. Head is normocephalic and atraumatic. PERRLA, EOMI. Oropharynx is clear. Neck is nontender and  supple without adenopathy. Lungs are clear without rales, wheezes, or rhonchi.  There is a slightly prolonged exhalation phase. Chest is nontender. Heart has regular rate and rhythm without murmur. Abdomen is soft, flat, nontender. Neurologic: Mental status is normal, moves all extremities equally.  ED Results / Procedures / Treatments   Labs (all labs ordered are listed, but only abnormal results are displayed) Labs Reviewed  RESP PANEL BY RT-PCR (RSV, FLU A&B, COVID)  [id number]   Radiology DG Chest 2 View Result Date: 02/24/2023 CLINICAL DATA:  Cough, fever EXAM: CHEST - 2 VIEW COMPARISON:  05/19/2022 FINDINGS: Cardiac and mediastinal contours are within normal limits. No focal pulmonary opacity. No pleural effusion or pneumothorax. No acute osseous abnormality. IMPRESSION: No acute cardiopulmonary process. Electronically Signed   By: Wiliam Ke M.D.   On: 02/24/2023 01:56    Procedures Procedures    Medications Ordered in ED Medications  predniSONE (DELTASONE) tablet 60 mg (60 mg Oral Given 02/24/23 0128)    ED Course/ Medical Decision Making/ A&P                                 Medical Decision Making Amount and/or Complexity of Data Reviewed Radiology: ordered.  Risk Prescription drug management.   Have respiratory tract infection which is likely viral, consider pneumonia.  I have reviewed her laboratory tests, and my interpretation is negative PCR for COVID-19, influenza, RSV.  I have ordered a chest x-ray to rule out pneumonia.  She does show some evidence of bronchospasm on exam, I have ordered a dose of prednisone.  Chest x-ray shows no evidence of pneumonia.  I have independently viewed the images, and agree with radiologist's interpretation.  There is no indication for antibiotics.  I am discharging her with a prescription for prednisone, advising her to continue using her inhaler as needed.  Final Clinical Impression(s) / ED Diagnoses Final diagnoses:   Influenza-like illness    Rx / DC Orders ED Discharge Orders          Ordered    predniSONE (DELTASONE) 50 MG tablet  Daily        02/24/23 0211              Dione Booze, MD 02/24/23 [phone]

## 2023-03-09 ENCOUNTER — Telehealth: Payer: Self-pay

## 2023-03-09 NOTE — Progress Notes (Signed)
 Transition Care Management Unsuccessful Follow-up Telephone Call  Date of discharge and from where:  02/24/2023 [facility]  Attempts:  1st Attempt  Reason for unsuccessful TCM follow-up call:  Left voice message  Colbi Schiltz [facility]  [facility], [facility] Guide Direct Dial: [phone]  Website: [email or web address]

## 2023-03-10 ENCOUNTER — Telehealth: Payer: Self-pay

## 2023-03-10 NOTE — Progress Notes (Signed)
 Transition Care Management Unsuccessful Follow-up Telephone Call  Date of discharge and from where:  02/24/2023 [facility]  Attempts:  2nd Attempt  Reason for unsuccessful TCM follow-up call:  Left voice message  Paden Kuras [facility]  [facility], [facility] Guide Direct Dial: [phone]  Website: [email or web address]

## 2023-03-23 DIAGNOSIS — F332 Major depressive disorder, recurrent severe without psychotic features: Secondary | ICD-10-CM | POA: Diagnosis not present

## 2023-03-23 DIAGNOSIS — F4312 Post-traumatic stress disorder, chronic: Secondary | ICD-10-CM | POA: Diagnosis not present

## 2023-03-23 DIAGNOSIS — F410 Panic disorder [episodic paroxysmal anxiety] without agoraphobia: Secondary | ICD-10-CM | POA: Diagnosis not present

## 2023-04-10 DIAGNOSIS — F4312 Post-traumatic stress disorder, chronic: Secondary | ICD-10-CM | POA: Diagnosis not present

## 2023-04-10 DIAGNOSIS — F410 Panic disorder [episodic paroxysmal anxiety] without agoraphobia: Secondary | ICD-10-CM | POA: Diagnosis not present

## 2023-04-10 DIAGNOSIS — F332 Major depressive disorder, recurrent severe without psychotic features: Secondary | ICD-10-CM | POA: Diagnosis not present

## 2023-04-13 ENCOUNTER — Encounter (HOSPITAL_BASED_OUTPATIENT_CLINIC_OR_DEPARTMENT_OTHER): Payer: Self-pay | Admitting: *Deleted

## 2023-04-13 ENCOUNTER — Other Ambulatory Visit: Payer: Self-pay

## 2023-04-13 ENCOUNTER — Other Ambulatory Visit (HOSPITAL_COMMUNITY): Payer: Self-pay

## 2023-04-13 ENCOUNTER — Emergency Department (HOSPITAL_BASED_OUTPATIENT_CLINIC_OR_DEPARTMENT_OTHER)
Admission: EM | Admit: 2023-04-13 | Discharge: 2023-04-13 | Disposition: A | Discharge status: Home / Self Care | Payer: 59 | Attending: Emergency Medicine | Admitting: Emergency Medicine

## 2023-04-13 ENCOUNTER — Other Ambulatory Visit (HOSPITAL_BASED_OUTPATIENT_CLINIC_OR_DEPARTMENT_OTHER): Payer: Self-pay

## 2023-04-13 DIAGNOSIS — A084 Viral intestinal infection, unspecified: Secondary | ICD-10-CM | POA: Insufficient documentation

## 2023-04-13 DIAGNOSIS — Z91040 Latex allergy status: Secondary | ICD-10-CM | POA: Diagnosis not present

## 2023-04-13 DIAGNOSIS — R112 Nausea with vomiting, unspecified: Secondary | ICD-10-CM | POA: Diagnosis not present

## 2023-04-13 LAB — COMPREHENSIVE METABOLIC PANEL
ALT: 53 U/L — ABNORMAL HIGH (ref 0–44)
AST: 24 U/L (ref 15–41)
Albumin: 4.4 g/dL (ref 3.5–5.0)
Alkaline Phosphatase: 66 U/L (ref 38–126)
Anion gap: 8 (ref 5–15)
BUN: 15 mg/dL (ref 6–20)
CO2: 22 mmol/L (ref 22–32)
Calcium: 8.9 mg/dL (ref 8.9–10.3)
Chloride: 107 mmol/L (ref 98–111)
Creatinine, Ser: 0.64 mg/dL (ref 0.44–1.00)
GFR, Estimated: >60 mL/min (ref >60)
Glucose, Bld: 119 mg/dL — ABNORMAL HIGH (ref 70–99)
Potassium: 3.7 mmol/L (ref 3.5–5.1)
Sodium: 137 mmol/L (ref 135–145)
Total Bilirubin: 1.1 mg/dL (ref 0.0–1.2)
Total Protein: 7.5 g/dL (ref 6.5–8.1)

## 2023-04-13 LAB — CBC WITH DIFFERENTIAL/PLATELET
Abs Immature Granulocytes: 0.04 10*3/uL (ref 0.00–0.07)
Basophils Absolute: 0 10*3/uL (ref 0.0–0.1)
Basophils Relative: 0 %
Eosinophils Absolute: 0 10*3/uL (ref 0.0–0.5)
Eosinophils Relative: 0 %
HCT: 46.7 % — ABNORMAL HIGH (ref 36.0–46.0)
Hemoglobin: 15.9 g/dL — ABNORMAL HIGH (ref 12.0–15.0)
Immature Granulocytes: 0 %
Lymphocytes Relative: 3 %
Lymphs Abs: 0.2 10*3/uL — ABNORMAL LOW (ref 0.7–4.0)
MCH: 31.1 pg (ref 26.0–34.0)
MCHC: 34 g/dL (ref 30.0–36.0)
MCV: 91.4 fL (ref 80.0–100.0)
Monocytes Absolute: 0.3 10*3/uL (ref 0.1–1.0)
Monocytes Relative: 4 %
Neutro Abs: 8.3 10*3/uL — ABNORMAL HIGH (ref 1.7–7.7)
Neutrophils Relative %: 93 %
Platelets: 241 10*3/uL (ref 150–400)
RBC: 5.11 MIL/uL (ref 3.87–5.11)
RDW: 12.2 % (ref 11.5–15.5)
WBC: 8.9 10*3/uL (ref 4.0–10.5)
nRBC: 0 % (ref 0.0–0.2)

## 2023-04-13 LAB — LIPASE, BLOOD: Lipase: 15 U/L (ref 11–51)

## 2023-04-13 LAB — HCG, SERUM, QUALITATIVE: Preg, Serum: NEGATIVE

## 2023-04-13 MED ORDER — ONDANSETRON HCL 4 MG/2ML IJ SOLN
4.0000 mg | Freq: Once | INTRAMUSCULAR | Status: AC
  Administered 2023-04-13: 4 mg via INTRAVENOUS
  Filled 2023-04-13: qty 2

## 2023-04-13 MED ORDER — PROMETHAZINE HCL 25 MG PO TABS: 25.0000 mg | ORAL_TABLET | Freq: Four times a day (QID) | ORAL | 0 refills | Status: AC | PRN

## 2023-04-13 MED ORDER — PROMETHAZINE HCL 25 MG/ML IJ SOLN
INTRAMUSCULAR | Status: AC
  Filled 2023-04-13: qty 1

## 2023-04-13 MED ORDER — SODIUM CHLORIDE 0.9 % IV SOLN
12.5000 mg | Freq: Once | INTRAVENOUS | Status: AC
  Administered 2023-04-13: 12.5 mg via INTRAVENOUS
  Filled 2023-04-13: qty 0.5

## 2023-04-13 MED ORDER — PROMETHAZINE HCL 25 MG PO TABS
25.0000 mg | ORAL_TABLET | Freq: Four times a day (QID) | ORAL | 0 refills | Status: DC | PRN
  Filled 2023-04-13: qty 30, 8d supply, fill #0

## 2023-04-13 MED ORDER — KETOROLAC TROMETHAMINE 15 MG/ML IJ SOLN
15.0000 mg | Freq: Once | INTRAMUSCULAR | Status: AC
  Administered 2023-04-13: 15 mg via INTRAVENOUS
  Filled 2023-04-13: qty 1

## 2023-04-13 MED ORDER — SODIUM CHLORIDE 0.9 % IV BOLUS
1000.0000 mL | Freq: Once | INTRAVENOUS | Status: AC
  Administered 2023-04-13: 1000 mL via INTRAVENOUS

## 2023-04-13 NOTE — ED Provider Notes (Signed)
 [place] EMERGENCY DEPARTMENT AT [facility] Provider Note   CSN: [id number] Arrival date & time: 04/13/23  9562     History  Chief Complaint  Patient presents with   Emesis   Diarrhea    Cynthia Frazier is a 33 y.o. female.  Patient is a 33 year old female presenting for nausea, vomiting, diarrhea that started 6 hours ago.  Patient 9-year-old son recently seen in the emergency department for similar symptoms.  Norovirus is currently going around.  No focal abdominal tenderness.  No fevers or chills.  The history is provided by the patient. No language interpreter was used.  Emesis Associated symptoms: diarrhea   Associated symptoms: no abdominal pain, no arthralgias, no chills, no cough, no fever and no sore throat   Diarrhea Associated symptoms: vomiting   Associated symptoms: no abdominal pain, no arthralgias, no chills and no fever        Home Medications Prior to Admission medications   Medication Sig Start Date End Date Taking? Authorizing Provider  promethazine  (PHENERGAN ) 25 MG tablet Take 1 tablet (25 mg total) by mouth every 6 (six) hours as needed for nausea or vomiting. 04/13/23  Yes Owen Blowers P, DO  busPIRone (BUSPAR) 15 MG tablet Take 20 mg by mouth 3 (three) times daily.    [provider]  ciclopirox  (PENLAC ) 8 % solution Apply topically at bedtime. Apply over nail and surrounding skin. Apply daily over previous coat. After seven (7) days, may remove with alcohol and continue cycle. 06/04/21   Kaye Parsons B, PA-C  ergocalciferol (VITAMIN D2) 1.25 MG (50000 UT) capsule  12/23/19   [provider]  Ferrous Sulfate (IRON PO) Take by mouth.    [provider]  fluconazole  (DIFLUCAN ) 150 MG tablet Take 1 tablet (150 mg total) by mouth daily. Repeat in 1 week if needed 05/27/22   Stephany Ehrich, MD  fluticasone  (FLONASE ) 50 MCG/ACT nasal spray Place 2 sprays into both nostrils daily. 04/08/22   Mardene Shake, FNP  ibuprofen   (ADVIL ) 400 MG tablet Take by mouth. 07/18/15   [provider]  lamoTRIgine  (LAMICTAL ) 25 MG tablet TAKE 2 TABLETS BY MOUTH DAILY 12/03/21   Lalonde, John C, MD  MIDODRINE HCL PO Take by mouth.    [provider]  montelukast  (SINGULAIR ) 10 MG tablet TAKE 1 TABLET BY MOUTH AT BEDTIME 01/13/22   Brian Campanile, MD  naproxen  (NAPROSYN ) 500 MG tablet Take 1 tablet (500 mg total) by mouth 2 (two) times daily with a meal. 11/05/22   Adolph Hoop, PA-C  Olopatadine  HCl 0.2 % SOLN Apply 1 drop to eye 2 (two) times daily. 07/18/21   Brian Campanile, MD  Olopatadine -Mometasone  (RYALTRIS ) [phone] MCG/ACT SUSP Place 2 sprays into the nose 2 (two) times daily. 07/18/21   Brian Campanile, MD  ondansetron  (ZOFRAN -ODT) 4 MG disintegrating tablet Take 1 tablet (4 mg total) by mouth every 8 (eight) hours as needed for nausea or vomiting. 12/19/22   Piontek, Cleveland Dales, MD  predniSONE  (DELTASONE ) 50 MG tablet Take 1 tablet (50 mg total) by mouth daily. 02/24/23   Alissa April, MD  sertraline  (ZOLOFT ) 100 MG tablet TAKE 1 TABLET BY MOUTH DAILY 12/03/21   Watson Hacking, MD  sertraline  (ZOLOFT ) 100 MG tablet Take by mouth.    [provider]      Allergies    Latex, Sumatriptan, Vancomycin, Avocado, Banana, Kiwi extract, and Levofloxacin    Review of Systems   Review of Systems  Constitutional:  Negative for chills and fever.  HENT:  Negative for ear pain and sore throat.   Eyes:  Negative for pain and visual disturbance.  Respiratory:  Negative for cough and shortness of breath.   Cardiovascular:  Negative for chest pain and palpitations.  Gastrointestinal:  Positive for diarrhea, nausea and vomiting. Negative for abdominal pain.  Genitourinary:  Negative for dysuria and hematuria.  Musculoskeletal:  Negative for arthralgias and back pain.  Skin:  Negative for color change and rash.  Neurological:  Negative for seizures and syncope.  All other systems reviewed  and are negative.   Physical Exam Updated Vital Signs BP 112/68 (BP Location: Left Arm)   Pulse 83   Temp (!) 101.8 F (38.8 C) (Oral)   Resp 15   Ht 5\' 4"  (1.626 m)   Wt 113.4 kg   SpO2 98%   BMI 42.91 kg/m  Physical Exam Vitals and nursing note reviewed.  Constitutional:      General: She is not in acute distress.    Appearance: She is well-developed.  HENT:     Head: Normocephalic and atraumatic.  Eyes:     Conjunctiva/sclera: Conjunctivae normal.  Cardiovascular:     Rate and Rhythm: Normal rate and regular rhythm.     Heart sounds: No murmur heard. Pulmonary:     Effort: Pulmonary effort is normal. No respiratory distress.     Breath sounds: Normal breath sounds.  Abdominal:     Palpations: Abdomen is soft.     Tenderness: There is no abdominal tenderness.  Musculoskeletal:        General: No swelling.     Cervical back: Neck supple.  Skin:    General: Skin is warm and dry.     Capillary Refill: Capillary refill takes less than 2 seconds.  Neurological:     Mental Status: She is alert.  Psychiatric:        Mood and Affect: Mood normal.     ED Results / Procedures / Treatments   Labs (all labs ordered are listed, but only abnormal results are displayed) Labs Reviewed  CBC WITH DIFFERENTIAL/PLATELET - Abnormal; Notable for the following components:      Result Value   Hemoglobin 15.9 (*)    HCT 46.7 (*)    Neutro Abs 8.3 (*)    Lymphs Abs 0.2 (*)    All other components within normal limits  COMPREHENSIVE METABOLIC PANEL - Abnormal; Notable for the following components:   Glucose, Bld 119 (*)    ALT 53 (*)    All other components within normal limits  LIPASE, BLOOD  HCG, SERUM, QUALITATIVE  URINALYSIS, ROUTINE W REFLEX MICROSCOPIC    EKG None  Radiology No results found.  Procedures Procedures    Medications Ordered in ED Medications  promethazine  (PHENERGAN ) 25 MG/ML injection (  Not Given 04/13/23 1106)  ketorolac  (TORADOL ) 15 MG/ML  injection 15 mg (has no administration in time range)  ondansetron  (ZOFRAN ) injection 4 mg (4 mg Intravenous Given 04/13/23 0819)  promethazine  (PHENERGAN ) 12.5 mg in sodium chloride  0.9 % 50 mL IVPB (12.5 mg Intravenous New Bag/Given 04/13/23 1052)  sodium chloride  0.9 % bolus 1,000 mL (1,000 mLs Intravenous New Bag/Given 04/13/23 1058)    ED Course/ Medical Decision Making/ A&P                                 Medical Decision Making Amount and/or Complexity of  Data Reviewed Labs: ordered.  Risk Prescription drug management.   33 year old female presenting for nausea, vomiting, diarrhea that started 6 hours ago.  Patient is alert and oriented x 3, no acute distress, afebrile, so vital signs.  Abdomen is soft and nontender.    High on differential includes gastroenteritis from norovirus.  Lower on differential includes but is not limited to intra-abdominal etiologies including diverticulitis, appendicitis, etc.    Zofran  given for symptomatic management.  Minimal improvement after Zofran .  Phenergan  and IV fluids given.  On reevaluation patient mitts to significant treatment of symptoms.  Laboratory studies are stable.  Electrolytes are stable.  COVID, influenza, RSV negative.  No leukocytosis.  Low suspicion for sepsis.  Stable liver profile.  Stable renal function.  Given her son's symptoms of nausea, vomiting, diarrhea it is likely that she has norovirus or some other viral gastroenteritis.  Patient recommended for Motrin  and Tylenol for fever or bodyaches, increase hydration, and prescriptions for antiemetics sent to pharmacy.  Abdomen is soft and remains nontender.  No abdominal imaging recommended at this time.  Patient recommended for prompt return precautions if abdominal pain develops.  Patient in no distress and overall condition improved here in the ED. Detailed discussions were had with the patient regarding current findings, and need for close f/u with PCP or on call doctor. The  patient has been instructed to return immediately if the symptoms worsen in any way for re-evaluation. Patient verbalized understanding and is in agreement with current care plan. All questions answered prior to discharge.         Final Clinical Impression(s) / ED Diagnoses Final diagnoses:  Viral gastroenteritis    Rx / DC Orders ED Discharge Orders          Ordered    promethazine  (PHENERGAN ) 25 MG tablet  Every 6 hours PRN        04/13/23 1143              Quinn Bucco, DO 04/13/23 1143

## 2023-04-13 NOTE — Discharge Instructions (Signed)
 Today you were diagnosed with a viral gastroenteritis likely contracted from your son.  Please stay hydrated, rest, prescription sent to pharmacy for nausea, and take Motrin  and/or Tylenol for fevers or bodyaches.

## 2023-04-13 NOTE — ED Triage Notes (Signed)
 Pt reporting NVD starting last night. Initially patient was vomiting every 20 minutes for approx. 6 hours and now reports she is mostly having diarrhea. Pts son has similar symptoms starting on Saturday.  Pt also reporting right sided flank pain starting last night. Pt reports, "when I am sick that kidney likes to hold onto fluid."

## 2023-04-14 ENCOUNTER — Telehealth: Payer: Self-pay

## 2023-04-14 NOTE — Transitions of Care (Post Inpatient/ED Visit) (Signed)
04/14/2023  Name: Cynthia Frazier MRN: [id number] DOB: 08-10-1990  Today's TOC FU Call Status: Today's TOC FU Call Status:: Successful TOC FU Call Completed TOC FU Call Complete Date: 04/14/23 Patient's Name and Date of Birth confirmed.  Transition Care Management Follow-up Telephone Call Date of Discharge: 04/13/23 Discharge Facility: [facility] ([facility]) Type of Discharge: Emergency Department Reason for ED Visit: Other: (viral infection) How have you been since you were released from the hospital?: Better Any questions or concerns?: No  Items Reviewed: Did you receive and understand the discharge instructions provided?: Yes Medications obtained,verified, and reconciled?: Yes (Medications Reviewed) Any new allergies since your discharge?: No Dietary orders reviewed?: Yes Do you have support at home?: No  Medications Reviewed Today: Medications Reviewed Today     Reviewed by Karena Addison, LPN (Licensed Practical Nurse) on 04/14/23 at 1538  Med List Status: <None>   Medication Order Taking? Sig Documenting Provider Last Dose Status Informant  busPIRone (BUSPAR) 15 MG tablet [id number] No Take 20 mg by mouth 3 (three) times daily. [provider] 05/12/2022 Active   ciclopirox (PENLAC) 8 % solution [id number] No Apply topically at bedtime. Apply over nail and surrounding skin. Apply daily over previous coat. After seven (7) days, may remove with alcohol and continue cycle. Jake Shark, PA-C 05/12/2022 Active   ergocalciferol (VITAMIN D2) 1.25 MG (50000 UT) capsule [id number] No  [provider] 05/12/2022 Active   Ferrous Sulfate (IRON PO) [id number]  Take by mouth. [provider]  Active   fluconazole (DIFLUCAN) 150 MG tablet [id number]  Take 1 tablet (150 mg total) by mouth daily. Repeat in 1 week if needed Eustace Moore, MD  Active   fluticasone [facility]) 50 MCG/ACT nasal spray [id number] No Place 2 sprays into both nostrils daily.  Viviano Simas, FNP 05/12/2022 Active   ibuprofen (ADVIL) 400 MG tablet [id number]  Take by mouth. [provider]  Active   lamoTRIgine (LAMICTAL) 25 MG tablet [id number] No TAKE 2 TABLETS BY MOUTH DAILY Ronnald Nian, MD 05/12/2022 Active   MIDODRINE HCL PO [id number]  Take by mouth. [provider]  Active   montelukast (SINGULAIR) 10 MG tablet [id number] No TAKE 1 TABLET BY MOUTH AT BEDTIME Marcelyn Bruins, MD 05/12/2022 Active   naproxen (NAPROSYN) 500 MG tablet [id number]  Take 1 tablet (500 mg total) by mouth 2 (two) times daily with a meal. [name], PA-C  Active   Olopatadine HCl 0.2 % SOLN [id number] No Apply 1 drop to eye 2 (two) times daily. Marcelyn Bruins, MD 05/12/2022 Active   Olopatadine-Mometasone (RYALTRIS) [phone] MCG/ACT SUSP [id number] No Place 2 sprays into the nose 2 (two) times daily. Marcelyn Bruins, MD 05/12/2022 Active   ondansetron (ZOFRAN-ODT) 4 MG disintegrating tablet [id number]  Take 1 tablet (4 mg total) by mouth every 8 (eight) hours as needed for nausea or vomiting. Jannifer Franklin, MD  Active   predniSONE (DELTASONE) 50 MG tablet [id number]  Take 1 tablet (50 mg total) by mouth daily. Dione Booze, MD  Active   promethazine (PHENERGAN) 25 MG tablet [id number]  Take 1 tablet (25 mg total) by mouth every 6 (six) hours as needed for nausea or vomiting. Franne Forts, DO  Active   sertraline (ZOLOFT) 100 MG tablet [id number] No TAKE 1 TABLET BY MOUTH DAILY Ronnald Nian, MD 05/12/2022 Active   sertraline (ZOLOFT) 100 MG tablet [id number]  Take by mouth. [provider]  Active  Home Care and Equipment/Supplies: Were Home Health Services Ordered?: NA Any new equipment or medical supplies ordered?: NA  Functional Questionnaire: Do you need assistance with bathing/showering or dressing?: No Do you need assistance with meal preparation?: No Do you need assistance with eating?: No Do you have difficulty  maintaining continence: No Do you need assistance with getting out of bed/getting out of a chair/moving?: No Do you have difficulty managing or taking your medications?: No  Follow up appointments reviewed: PCP Follow-up appointment confirmed?: NA Specialist Hospital Follow-up appointment confirmed?: NA Do you need transportation to your follow-up appointment?: No Do you understand care options if your condition(s) worsen?: Yes-patient verbalized understanding    SIGNATURE Karena Addison, LPN [facility] Nurse Health Advisor Direct Dial [phone]

## 2023-06-01 DIAGNOSIS — L299 Pruritus, unspecified: Secondary | ICD-10-CM | POA: Diagnosis not present

## 2023-06-01 DIAGNOSIS — R221 Localized swelling, mass and lump, neck: Secondary | ICD-10-CM | POA: Diagnosis not present

## 2023-06-01 DIAGNOSIS — R1310 Dysphagia, unspecified: Secondary | ICD-10-CM | POA: Diagnosis not present

## 2023-06-03 ENCOUNTER — Telehealth: Payer: Self-pay

## 2023-06-03 NOTE — Telephone Encounter (Signed)
 The patient called in and left a voicemail requesting an appointment. She stated that her PCP referred her to a GI provider because she is unable to swallow anything other than liquids and has lost 15 pounds over the past week. She mentioned trying different places, but they cannot see her until June or September. I returned her call to confirm we received her voicemail and informed her that she will need to have a referral sent to our office. I explained that her provider must send over the referral, and we can ask our provider to review it to determine if it's urgent. I clarified that I can't make any promises, but I would send a message to the provider. The patient understood and said she would have her provider send the referral now.

## 2023-06-03 NOTE — Telephone Encounter (Signed)
 I spoke to pt and went over the changes going on in the office... Advised pt to call PCP have them send an URGENT referral to another GI so that she can be evaluated sooner than regular referral. Pt has been experiencing dysphagia, ED precautions given... Pt expressed understanding and stated she will reach out to her PCP

## 2023-06-04 DIAGNOSIS — R1319 Other dysphagia: Secondary | ICD-10-CM | POA: Diagnosis not present

## 2023-06-19 DIAGNOSIS — F410 Panic disorder [episodic paroxysmal anxiety] without agoraphobia: Secondary | ICD-10-CM | POA: Diagnosis not present

## 2023-06-19 DIAGNOSIS — F332 Major depressive disorder, recurrent severe without psychotic features: Secondary | ICD-10-CM | POA: Diagnosis not present

## 2023-06-19 DIAGNOSIS — F4312 Post-traumatic stress disorder, chronic: Secondary | ICD-10-CM | POA: Diagnosis not present

## 2023-06-29 DIAGNOSIS — H9201 Otalgia, right ear: Secondary | ICD-10-CM | POA: Diagnosis not present

## 2023-06-29 DIAGNOSIS — R509 Fever, unspecified: Secondary | ICD-10-CM | POA: Diagnosis not present

## 2023-06-29 DIAGNOSIS — R1310 Dysphagia, unspecified: Secondary | ICD-10-CM | POA: Diagnosis not present

## 2023-06-29 DIAGNOSIS — R634 Abnormal weight loss: Secondary | ICD-10-CM | POA: Diagnosis not present

## 2023-06-30 DIAGNOSIS — G4730 Sleep apnea, unspecified: Secondary | ICD-10-CM | POA: Diagnosis not present

## 2023-06-30 DIAGNOSIS — K3189 Other diseases of stomach and duodenum: Secondary | ICD-10-CM | POA: Diagnosis not present

## 2023-06-30 DIAGNOSIS — K2950 Unspecified chronic gastritis without bleeding: Secondary | ICD-10-CM | POA: Diagnosis not present

## 2023-06-30 DIAGNOSIS — K319 Disease of stomach and duodenum, unspecified: Secondary | ICD-10-CM | POA: Diagnosis not present

## 2023-06-30 DIAGNOSIS — K200 Eosinophilic esophagitis: Secondary | ICD-10-CM | POA: Diagnosis not present

## 2023-06-30 DIAGNOSIS — R1310 Dysphagia, unspecified: Secondary | ICD-10-CM | POA: Diagnosis not present

## 2023-06-30 DIAGNOSIS — K2289 Other specified disease of esophagus: Secondary | ICD-10-CM | POA: Diagnosis not present

## 2023-08-05 DIAGNOSIS — K200 Eosinophilic esophagitis: Secondary | ICD-10-CM | POA: Diagnosis not present

## 2023-08-17 ENCOUNTER — Emergency Department (HOSPITAL_BASED_OUTPATIENT_CLINIC_OR_DEPARTMENT_OTHER)
Admission: EM | Admit: 2023-08-17 | Discharge: 2023-08-17 | Disposition: A | Discharge status: Home / Self Care | Attending: Emergency Medicine | Admitting: Emergency Medicine

## 2023-08-17 ENCOUNTER — Emergency Department (HOSPITAL_BASED_OUTPATIENT_CLINIC_OR_DEPARTMENT_OTHER)

## 2023-08-17 ENCOUNTER — Other Ambulatory Visit: Payer: Self-pay

## 2023-08-17 ENCOUNTER — Encounter (HOSPITAL_BASED_OUTPATIENT_CLINIC_OR_DEPARTMENT_OTHER): Payer: Self-pay

## 2023-08-17 DIAGNOSIS — Z91040 Latex allergy status: Secondary | ICD-10-CM | POA: Diagnosis not present

## 2023-08-17 DIAGNOSIS — D72829 Elevated white blood cell count, unspecified: Secondary | ICD-10-CM | POA: Diagnosis not present

## 2023-08-17 DIAGNOSIS — Z9049 Acquired absence of other specified parts of digestive tract: Secondary | ICD-10-CM | POA: Diagnosis not present

## 2023-08-17 DIAGNOSIS — R109 Unspecified abdominal pain: Secondary | ICD-10-CM

## 2023-08-17 DIAGNOSIS — R102 Pelvic and perineal pain: Secondary | ICD-10-CM | POA: Diagnosis not present

## 2023-08-17 DIAGNOSIS — K529 Noninfective gastroenteritis and colitis, unspecified: Secondary | ICD-10-CM | POA: Diagnosis not present

## 2023-08-17 LAB — CBC WITH DIFFERENTIAL/PLATELET
Abs Immature Granulocytes: 0.05 10*3/uL (ref 0.00–0.07)
Basophils Absolute: 0.1 10*3/uL (ref 0.0–0.1)
Basophils Relative: 1 %
Eosinophils Absolute: 0.4 10*3/uL (ref 0.0–0.5)
Eosinophils Relative: 3 %
HCT: 41.7 % (ref 36.0–46.0)
Hemoglobin: 14.3 g/dL (ref 12.0–15.0)
Immature Granulocytes: 0 %
Lymphocytes Relative: 18 %
Lymphs Abs: 2.5 10*3/uL (ref 0.7–4.0)
MCH: 31.2 pg (ref 26.0–34.0)
MCHC: 34.3 g/dL (ref 30.0–36.0)
MCV: 90.8 fL (ref 80.0–100.0)
Monocytes Absolute: 1.3 10*3/uL — ABNORMAL HIGH (ref 0.1–1.0)
Monocytes Relative: 9 %
Neutro Abs: 9.5 10*3/uL — ABNORMAL HIGH (ref 1.7–7.7)
Neutrophils Relative %: 69 %
Platelets: 295 10*3/uL (ref 150–400)
RBC: 4.59 MIL/uL (ref 3.87–5.11)
RDW: 12.2 % (ref 11.5–15.5)
WBC: 13.8 10*3/uL — ABNORMAL HIGH (ref 4.0–10.5)
nRBC: 0 % (ref 0.0–0.2)

## 2023-08-17 LAB — COMPREHENSIVE METABOLIC PANEL WITH GFR
ALT: 47 U/L — ABNORMAL HIGH (ref 0–44)
AST: 29 U/L (ref 15–41)
Albumin: 4.6 g/dL (ref 3.5–5.0)
Alkaline Phosphatase: 93 U/L (ref 38–126)
Anion gap: 16 — ABNORMAL HIGH (ref 5–15)
BUN: 13 mg/dL (ref 6–20)
CO2: 22 mmol/L (ref 22–32)
Calcium: 9.8 mg/dL (ref 8.9–10.3)
Chloride: 103 mmol/L (ref 98–111)
Creatinine, Ser: 0.72 mg/dL (ref 0.44–1.00)
GFR, Estimated: >60 mL/min (ref >60)
Glucose, Bld: 85 mg/dL (ref 70–99)
Potassium: 4.1 mmol/L (ref 3.5–5.1)
Sodium: 140 mmol/L (ref 135–145)
Total Bilirubin: 0.4 mg/dL (ref 0.0–1.2)
Total Protein: 7.8 g/dL (ref 6.5–8.1)

## 2023-08-17 LAB — URINALYSIS, ROUTINE W REFLEX MICROSCOPIC
Bacteria, UA: NONE SEEN
Bilirubin Urine: NEGATIVE
Glucose, UA: NEGATIVE mg/dL
Ketones, ur: NEGATIVE mg/dL
Leukocytes,Ua: NEGATIVE
Nitrite: NEGATIVE
Protein, ur: NEGATIVE mg/dL
Specific Gravity, Urine: 1.007 (ref 1.005–1.030)
pH: 6.5 (ref 5.0–8.0)

## 2023-08-17 LAB — WET PREP, GENITAL
Clue Cells Wet Prep HPF POC: NONE SEEN
Sperm: NONE SEEN
Trich, Wet Prep: NONE SEEN
WBC, Wet Prep HPF POC: <10 (ref <10)
Yeast Wet Prep HPF POC: NONE SEEN

## 2023-08-17 LAB — PREGNANCY, URINE: Preg Test, Ur: NEGATIVE

## 2023-08-17 LAB — LIPASE, BLOOD: Lipase: 26 U/L (ref 11–51)

## 2023-08-17 MED ORDER — ONDANSETRON 4 MG PO TBDP
4.0000 mg | ORAL_TABLET | Freq: Three times a day (TID) | ORAL | 0 refills | Status: DC | PRN
  Filled 2023-08-17: qty 20, 7d supply, fill #0

## 2023-08-17 MED ORDER — SODIUM CHLORIDE 0.9 % IV BOLUS
1000.0000 mL | Freq: Once | INTRAVENOUS | Status: AC
  Administered 2023-08-17: 1000 mL via INTRAVENOUS

## 2023-08-17 MED ORDER — ONDANSETRON HCL 4 MG/2ML IJ SOLN
4.0000 mg | Freq: Once | INTRAMUSCULAR | Status: AC
  Administered 2023-08-17: 4 mg via INTRAVENOUS
  Filled 2023-08-17: qty 2

## 2023-08-17 MED ORDER — ONDANSETRON 4 MG PO TBDP
4.0000 mg | ORAL_TABLET | Freq: Three times a day (TID) | ORAL | 0 refills | Status: DC | PRN
Start: 2023-08-17 — End: 2023-12-30

## 2023-08-17 MED ORDER — LOPERAMIDE HCL 2 MG PO CAPS
2.0000 mg | ORAL_CAPSULE | Freq: Four times a day (QID) | ORAL | 0 refills | Status: DC | PRN
  Filled 2023-08-17: qty 12, 3d supply, fill #0

## 2023-08-17 MED ORDER — LOPERAMIDE HCL 2 MG PO CAPS: 2.0000 mg | ORAL_CAPSULE | Freq: Four times a day (QID) | ORAL | 0 refills | Status: DC | PRN

## 2023-08-17 MED ORDER — KETOROLAC TROMETHAMINE 15 MG/ML IJ SOLN
15.0000 mg | Freq: Once | INTRAMUSCULAR | Status: AC
Start: 2023-08-17 — End: 2023-08-17
  Administered 2023-08-17: 15 mg via INTRAVENOUS
  Filled 2023-08-17: qty 1

## 2023-08-17 NOTE — ED Provider Notes (Signed)
 [place] EMERGENCY DEPARTMENT AT [facility] Provider Note   CSN: [id number] Arrival date & time: 08/17/23  1358     Patient presents with: Flank Pain   Cynthia Frazier is a 33 y.o. female with past medical history of obesity, anxiety and depression presenting to emergency room with complaint of left-sided flank pain.  Patient reports that it started 3 days ago and has been constant but intermittently becomes more severe.  It is associated with nausea, vomiting and she notes she has had several episodes of loose stool as well.  Notes 6 loose stools over the past 3 days. No recent abx, no recent travel, no sick contact.     Flank Pain       Prior to Admission medications   Medication Sig Start Date End Date Taking? Authorizing Provider  busPIRone (BUSPAR) 15 MG tablet Take 20 mg by mouth 3 (three) times daily.    [provider]  ciclopirox  (PENLAC ) 8 % solution Apply topically at bedtime. Apply over nail and surrounding skin. Apply daily over previous coat. After seven (7) days, may remove with alcohol and continue cycle. 06/04/21   Kaye Parsons B, PA-C  ergocalciferol (VITAMIN D2) 1.25 MG (50000 UT) capsule  12/23/19   [provider]  Ferrous Sulfate (IRON PO) Take by mouth.    [provider]  fluconazole  (DIFLUCAN ) 150 MG tablet Take 1 tablet (150 mg total) by mouth daily. Repeat in 1 week if needed 05/27/22   Stephany Ehrich, MD  fluticasone  (FLONASE ) 50 MCG/ACT nasal spray Place 2 sprays into both nostrils daily. 04/08/22   Mardene Shake, FNP  ibuprofen  (ADVIL ) 400 MG tablet Take by mouth. 07/18/15   [provider]  lamoTRIgine  (LAMICTAL ) 25 MG tablet TAKE 2 TABLETS BY MOUTH DAILY 12/03/21   Lalonde, John C, MD  MIDODRINE HCL PO Take by mouth.    [provider]  montelukast  (SINGULAIR ) 10 MG tablet TAKE 1 TABLET BY MOUTH AT BEDTIME 01/13/22   Brian Campanile, MD  naproxen  (NAPROSYN ) 500 MG tablet Take 1 tablet (500  mg total) by mouth 2 (two) times daily with a meal. 11/05/22   Adolph Hoop, PA-C  Olopatadine  HCl 0.2 % SOLN Apply 1 drop to eye 2 (two) times daily. 07/18/21   Brian Campanile, MD  Olopatadine -Mometasone  (RYALTRIS ) [phone] MCG/ACT SUSP Place 2 sprays into the nose 2 (two) times daily. 07/18/21   Brian Campanile, MD  ondansetron  (ZOFRAN -ODT) 4 MG disintegrating tablet Take 1 tablet (4 mg total) by mouth every 8 (eight) hours as needed for nausea or vomiting. 12/19/22   Piontek, Cleveland Dales, MD  predniSONE  (DELTASONE ) 50 MG tablet Take 1 tablet (50 mg total) by mouth daily. 02/24/23   Alissa April, MD  promethazine  (PHENERGAN ) 25 MG tablet Take 1 tablet (25 mg total) by mouth every 6 (six) hours as needed for nausea or vomiting. 04/13/23   Owen Blowers P, DO  sertraline  (ZOLOFT ) 100 MG tablet TAKE 1 TABLET BY MOUTH DAILY 12/03/21   Watson Hacking, MD  sertraline  (ZOLOFT ) 100 MG tablet Take by mouth.    [provider]    Allergies: Latex, Sumatriptan, Vancomycin, Avocado, Banana, Kiwi extract, and Levofloxacin    Review of Systems  Genitourinary:  Positive for flank pain.    Updated Vital Signs BP (!) 141/80 (BP Location: Right Arm)   Pulse 88   Temp 97.9 F (36.6 C)   Resp 16   SpO2 100%   Physical Exam Vitals and  nursing note reviewed.  Constitutional:      General: She is not in acute distress.    Appearance: She is not toxic-appearing.  HENT:     Head: Normocephalic and atraumatic.   Eyes:     General: No scleral icterus.    Conjunctiva/sclera: Conjunctivae normal.    Cardiovascular:     Rate and Rhythm: Normal rate and regular rhythm.     Pulses: Normal pulses.     Heart sounds: Normal heart sounds.  Pulmonary:     Effort: Pulmonary effort is normal. No respiratory distress.     Breath sounds: Normal breath sounds.  Abdominal:     General: Abdomen is flat. Bowel sounds are normal.     Palpations: Abdomen is soft.     Tenderness: There is no abdominal  tenderness.     Comments: Pain is not reproducible on exam, locates to LLQ, left side and into low back.    Musculoskeletal:     Right lower leg: No edema.     Left lower leg: No edema.   Skin:    General: Skin is warm and dry.     Findings: No lesion.   Neurological:     General: No focal deficit present.     Mental Status: She is alert and oriented to person, place, and time. Mental status is at baseline.     (all labs ordered are listed, but only abnormal results are displayed) Labs Reviewed  URINALYSIS, ROUTINE W REFLEX MICROSCOPIC - Abnormal; Notable for the following components:      Result Value   Color, Urine COLORLESS (*)    Hgb urine dipstick SMALL (*)    All other components within normal limits  COMPREHENSIVE METABOLIC PANEL WITH GFR - Abnormal; Notable for the following components:   ALT 47 (*)    Anion gap 16 (*)    All other components within normal limits  CBC WITH DIFFERENTIAL/PLATELET - Abnormal; Notable for the following components:   WBC 13.8 (*)    Neutro Abs 9.5 (*)    Monocytes Absolute 1.3 (*)    All other components within normal limits  LIPASE, BLOOD  PREGNANCY, URINE    EKG: None  Radiology: No results found.   Procedures   Medications Ordered in the ED  ondansetron  (ZOFRAN ) injection 4 mg (has no administration in time range)  sodium chloride  0.9 % bolus 1,000 mL (has no administration in time range)                                    Medical Decision Making Amount and/or Complexity of Data Reviewed Labs: ordered. Radiology: ordered.  Risk Prescription drug management.   Cynthia Frazier 33 y.o. presented today for abd pain. Working DDx includes, but not limited to, gastroenteritis, colitis, SBO, appendicitis, cholecystitis, hepatobiliary pathology, gastritis, PUD, ACS, dissection, pancreatitis, nephrolithiasis, AAA, UTI, pyelonephritis, ruptured ectopic pregnancy, PID, ovarian torsion.   R/o DDx: These are considered less  likely than current impression due to history of present illness, physical exam, labs/imaging findings.   Pmhx: Anxiety, depression, s/p cholecystectomy.   Unique Tests and My Interpretation:  UA with small hgb, negative for leukocytes, nitrites. CBC shows leukocytosis, no anemia.  CMP without significant electrolyte abnormality.  Does have anion gap of 16.  Lipase within normal limits.    Imaging:  CT Renal Study: due to favored nephrolithiasis over GI etiology --no acute findings. Ultrasound  with no acute findings.  No suggestions of ovarian torsion  Problem List / ED Course / Critical interventions / Medication management  Patient presenting to emergency room with complaint of left flank pain this has been ongoing for 3 days.  She is hemodynamically stable and well-appearing she has not had associated fever.  She notes that her abdominal and back pain are worse with movement but they are not reproducible on exam.  She has had 6 days of loose stools a day associated with this, has not tried any medications.  She denies any recent antibiotic use.  No suspicious food intake.  No recent out of the country travel.  She did not have any bowel movements through staying in the emergency room.  She is tolerating oral intake.  Labs overall reassuring.  Imaging without evidence of acute abnormality.  Feel symptoms are most likely secondary to enteritis.  Discussed pain management.  Discussed Zofran  for nausea or vomiting.  Discussed if Imodium as needed for recurrent loose stools. I ordered medication including zofran , NS. Declined pain medications.  Reevaluation of the patient after these medicines showed that the patient stayed the same Patients vitals assessed. Upon arrival patient is hemodynamically stable.  I have reviewed the patients home medicines and have made adjustments as needed    Plan: F/u w/ PCP in 2-3d to ensure resolution of sx.  Patient was given return precautions. Patient stable  for discharge at this time.  Patient educated on current sx/dx and verbalized understanding of plan. Return to ER w/ new or worsening sx.       Final diagnoses:  Left flank pain  Enteritis    ED Discharge Orders          Ordered    ondansetron  (ZOFRAN -ODT) 4 MG disintegrating tablet  Every 8 hours PRN,   Status:  Discontinued        08/17/23 2024    loperamide (IMODIUM) 2 MG capsule  4 times daily PRN,   Status:  Discontinued        08/17/23 2024    loperamide (IMODIUM) 2 MG capsule  4 times daily PRN        08/17/23 2059    ondansetron  (ZOFRAN -ODT) 4 MG disintegrating tablet  Every 8 hours PRN        08/17/23 2059               Cynthia Frazier, Kandace Organ, PA-C 08/17/23 2116    Sallyanne Creamer, DO 08/18/23 [phone]

## 2023-08-17 NOTE — ED Triage Notes (Signed)
 Pt c/o L sided back, flank, pelvic pain onset 3 days ago, not gotten any better. Called PCP today, advised to be eval Hurts really bad when I pee, smells 'different'

## 2023-08-17 NOTE — ED Notes (Signed)
 Pt self swabbed for both specimens.

## 2023-08-17 NOTE — Discharge Instructions (Signed)
 Take Zofran  as needed for nausea or vomiting.  Use Imodium as needed for loose stool.  Make sure staying well-hydrated with primarily water return to emergency room with new or worsening symptoms.

## 2023-08-18 ENCOUNTER — Other Ambulatory Visit (HOSPITAL_BASED_OUTPATIENT_CLINIC_OR_DEPARTMENT_OTHER): Payer: Self-pay

## 2023-08-18 LAB — URINE CULTURE: Culture: NO GROWTH

## 2023-08-18 LAB — GC/CHLAMYDIA PROBE AMP (~~LOC~~) NOT AT ARMC
Chlamydia: NEGATIVE
Comment: NEGATIVE
Comment: NORMAL
Neisseria Gonorrhea: NEGATIVE

## 2023-10-07 DIAGNOSIS — K200 Eosinophilic esophagitis: Secondary | ICD-10-CM | POA: Diagnosis not present

## 2023-12-02 DIAGNOSIS — R079 Chest pain, unspecified: Secondary | ICD-10-CM | POA: Diagnosis not present

## 2023-12-02 DIAGNOSIS — F4310 Post-traumatic stress disorder, unspecified: Secondary | ICD-10-CM | POA: Diagnosis not present

## 2023-12-02 DIAGNOSIS — R0789 Other chest pain: Secondary | ICD-10-CM | POA: Diagnosis not present

## 2023-12-02 DIAGNOSIS — R319 Hematuria, unspecified: Secondary | ICD-10-CM | POA: Diagnosis not present

## 2023-12-02 DIAGNOSIS — R1011 Right upper quadrant pain: Secondary | ICD-10-CM | POA: Diagnosis not present

## 2023-12-02 DIAGNOSIS — R0602 Shortness of breath: Secondary | ICD-10-CM | POA: Diagnosis not present

## 2023-12-02 DIAGNOSIS — R112 Nausea with vomiting, unspecified: Secondary | ICD-10-CM | POA: Diagnosis not present

## 2023-12-02 DIAGNOSIS — K200 Eosinophilic esophagitis: Secondary | ICD-10-CM | POA: Diagnosis not present

## 2023-12-02 DIAGNOSIS — R1110 Vomiting, unspecified: Secondary | ICD-10-CM | POA: Diagnosis not present

## 2023-12-03 DIAGNOSIS — R000 Tachycardia, unspecified: Secondary | ICD-10-CM | POA: Diagnosis not present

## 2023-12-03 DIAGNOSIS — R079 Chest pain, unspecified: Secondary | ICD-10-CM | POA: Diagnosis not present

## 2023-12-04 DIAGNOSIS — F429 Obsessive-compulsive disorder, unspecified: Secondary | ICD-10-CM | POA: Diagnosis not present

## 2023-12-07 DIAGNOSIS — K200 Eosinophilic esophagitis: Secondary | ICD-10-CM | POA: Diagnosis not present

## 2023-12-07 DIAGNOSIS — F4312 Post-traumatic stress disorder, chronic: Secondary | ICD-10-CM | POA: Diagnosis not present

## 2023-12-07 DIAGNOSIS — R1310 Dysphagia, unspecified: Secondary | ICD-10-CM | POA: Diagnosis not present

## 2023-12-07 DIAGNOSIS — F410 Panic disorder [episodic paroxysmal anxiety] without agoraphobia: Secondary | ICD-10-CM | POA: Diagnosis not present

## 2023-12-07 DIAGNOSIS — F332 Major depressive disorder, recurrent severe without psychotic features: Secondary | ICD-10-CM | POA: Diagnosis not present

## 2023-12-09 DIAGNOSIS — F429 Obsessive-compulsive disorder, unspecified: Secondary | ICD-10-CM | POA: Diagnosis not present

## 2023-12-15 ENCOUNTER — Other Ambulatory Visit: Payer: Self-pay

## 2023-12-15 ENCOUNTER — Emergency Department (HOSPITAL_BASED_OUTPATIENT_CLINIC_OR_DEPARTMENT_OTHER)

## 2023-12-15 ENCOUNTER — Emergency Department (HOSPITAL_BASED_OUTPATIENT_CLINIC_OR_DEPARTMENT_OTHER)
Admission: EM | Admit: 2023-12-15 | Discharge: 2023-12-16 | Disposition: A | Discharge status: Home / Self Care | Attending: Emergency Medicine | Admitting: Emergency Medicine

## 2023-12-15 DIAGNOSIS — R10A3 Flank pain, bilateral: Secondary | ICD-10-CM | POA: Insufficient documentation

## 2023-12-15 DIAGNOSIS — Z91040 Latex allergy status: Secondary | ICD-10-CM | POA: Insufficient documentation

## 2023-12-15 DIAGNOSIS — G4733 Obstructive sleep apnea (adult) (pediatric): Secondary | ICD-10-CM | POA: Diagnosis not present

## 2023-12-15 DIAGNOSIS — R9431 Abnormal electrocardiogram [ECG] [EKG]: Secondary | ICD-10-CM | POA: Diagnosis not present

## 2023-12-15 DIAGNOSIS — K200 Eosinophilic esophagitis: Secondary | ICD-10-CM | POA: Diagnosis not present

## 2023-12-15 DIAGNOSIS — R1084 Generalized abdominal pain: Secondary | ICD-10-CM | POA: Diagnosis not present

## 2023-12-15 DIAGNOSIS — R1310 Dysphagia, unspecified: Secondary | ICD-10-CM | POA: Diagnosis not present

## 2023-12-15 LAB — PRO BRAIN NATRIURETIC PEPTIDE: Pro Brain Natriuretic Peptide: 84.8 pg/mL (ref <300.0)

## 2023-12-15 LAB — BASIC METABOLIC PANEL WITH GFR
Anion gap: 12 (ref 5–15)
BUN: 9 mg/dL (ref 6–20)
CO2: 23 mmol/L (ref 22–32)
Calcium: 9.6 mg/dL (ref 8.9–10.3)
Chloride: 105 mmol/L (ref 98–111)
Creatinine, Ser: 0.87 mg/dL (ref 0.44–1.00)
GFR, Estimated: >60 mL/min (ref >60)
Glucose, Bld: 126 mg/dL — ABNORMAL HIGH (ref 70–99)
Potassium: 3.9 mmol/L (ref 3.5–5.1)
Sodium: 140 mmol/L (ref 135–145)

## 2023-12-15 LAB — HEPATIC FUNCTION PANEL
ALT: 50 U/L — ABNORMAL HIGH (ref 0–44)
AST: 28 U/L (ref 15–41)
Albumin: 4.5 g/dL (ref 3.5–5.0)
Alkaline Phosphatase: 81 U/L (ref 38–126)
Bilirubin, Direct: 0.2 mg/dL (ref 0.0–0.2)
Indirect Bilirubin: 0.2 mg/dL — ABNORMAL LOW (ref 0.3–0.9)
Total Bilirubin: 0.4 mg/dL (ref 0.0–1.2)
Total Protein: 7 g/dL (ref 6.5–8.1)

## 2023-12-15 LAB — LIPASE, BLOOD: Lipase: 22 U/L (ref 11–51)

## 2023-12-15 LAB — CBC
HCT: 42.8 % (ref 36.0–46.0)
Hemoglobin: 14.8 g/dL (ref 12.0–15.0)
MCH: 31.7 pg (ref 26.0–34.0)
MCHC: 34.6 g/dL (ref 30.0–36.0)
MCV: 91.6 fL (ref 80.0–100.0)
Platelets: 293 K/uL (ref 150–400)
RBC: 4.67 MIL/uL (ref 3.87–5.11)
RDW: 12.1 % (ref 11.5–15.5)
WBC: 8.8 K/uL (ref 4.0–10.5)
nRBC: 0 % (ref 0.0–0.2)

## 2023-12-15 LAB — TROPONIN T, HIGH SENSITIVITY: Troponin T High Sensitivity: <15 ng/L (ref 0–19)

## 2023-12-15 LAB — HCG, SERUM, QUALITATIVE: Preg, Serum: NEGATIVE

## 2023-12-15 MED ORDER — ONDANSETRON HCL 4 MG/2ML IJ SOLN
4.0000 mg | Freq: Once | INTRAMUSCULAR | Status: AC
  Administered 2023-12-15: 4 mg via INTRAVENOUS
  Filled 2023-12-15: qty 2

## 2023-12-15 MED ORDER — LACTATED RINGERS IV BOLUS
500.0000 mL | Freq: Once | INTRAVENOUS | Status: AC
  Administered 2023-12-15: 500 mL via INTRAVENOUS

## 2023-12-15 MED ORDER — MORPHINE SULFATE (PF) 4 MG/ML IV SOLN
4.0000 mg | Freq: Once | INTRAVENOUS | Status: AC
  Administered 2023-12-15: 4 mg via INTRAVENOUS
  Filled 2023-12-15: qty 1

## 2023-12-15 MED ORDER — LACTATED RINGERS IV BOLUS: 1000.0000 mL | Freq: Once | INTRAVENOUS | Status: DC

## 2023-12-15 NOTE — ED Triage Notes (Signed)
 Pt POV reporting bilateral flank pain, worse on R x3 days and no urine output x8hrs, endoscopy this AM.

## 2023-12-15 NOTE — ED Provider Notes (Signed)
 [place] EMERGENCY DEPARTMENT AT [facility] Provider Note   CSN: [id number] Arrival date & time: 12/15/23  1846     Patient presents with: Flank Pain   Cynthia Frazier is a 33 y.o. female.  {Add pertinent medical, surgical, social history, OB history to HPI:32947} HPI     Prior to Admission medications   Medication Sig Start Date End Date Taking? Authorizing Provider  busPIRone (BUSPAR) 15 MG tablet Take 20 mg by mouth 3 (three) times daily.    [provider]  ciclopirox  (PENLAC ) 8 % solution Apply topically at bedtime. Apply over nail and surrounding skin. Apply daily over previous coat. After seven (7) days, may remove with alcohol and continue cycle. 06/04/21   Trudy Gibson B, PA-C  ergocalciferol (VITAMIN D2) 1.25 MG (50000 UT) capsule  12/23/19   [provider]  Ferrous Sulfate (IRON PO) Take by mouth.    [provider]  fluconazole  (DIFLUCAN ) 150 MG tablet Take 1 tablet (150 mg total) by mouth daily. Repeat in 1 week if needed 05/27/22   Maranda Jamee Jacob, MD  fluticasone  (FLONASE ) 50 MCG/ACT nasal spray Place 2 sprays into both nostrils daily. 04/08/22   Kennyth Domino, FNP  ibuprofen  (ADVIL ) 400 MG tablet Take by mouth. 07/18/15   [provider]  lamoTRIgine  (LAMICTAL ) 25 MG tablet TAKE 2 TABLETS BY MOUTH DAILY 12/03/21   Joyce Norleen BROCKS, MD  loperamide  (IMODIUM ) 2 MG capsule Take 1 capsule (2 mg total) by mouth 4 (four) times daily as needed for diarrhea or loose stools. 08/17/23   Barrett, Warren SAILOR, PA-C  MIDODRINE HCL PO Take by mouth.    [provider]  montelukast  (SINGULAIR ) 10 MG tablet TAKE 1 TABLET BY MOUTH AT BEDTIME 01/13/22   Jeneal Danita Macintosh, MD  naproxen  (NAPROSYN ) 500 MG tablet Take 1 tablet (500 mg total) by mouth 2 (two) times daily with a meal. 11/05/22   Christopher Savannah, PA-C  Olopatadine  HCl 0.2 % SOLN Apply 1 drop to eye 2 (two) times daily. 07/18/21   Jeneal Danita Macintosh, MD   Olopatadine -Mometasone  (RYALTRIS ) [phone] MCG/ACT SUSP Place 2 sprays into the nose 2 (two) times daily. 07/18/21   Jeneal Danita Macintosh, MD  ondansetron  (ZOFRAN -ODT) 4 MG disintegrating tablet Take 1 tablet (4 mg total) by mouth every 8 (eight) hours as needed for nausea or vomiting. 08/17/23   Barrett, Warren SAILOR, PA-C  predniSONE  (DELTASONE ) 50 MG tablet Take 1 tablet (50 mg total) by mouth daily. 02/24/23   Raford Lenis, MD  promethazine  (PHENERGAN ) 25 MG tablet Take 1 tablet (25 mg total) by mouth every 6 (six) hours as needed for nausea or vomiting. 04/13/23   Elnor Hila P, DO  sertraline  (ZOLOFT ) 100 MG tablet TAKE 1 TABLET BY MOUTH DAILY 12/03/21   Joyce Norleen BROCKS, MD  sertraline  (ZOLOFT ) 100 MG tablet Take by mouth.    [provider]    Allergies: Latex, Sumatriptan, Vancomycin, Avocado, Banana, Kiwi extract, and Levofloxacin    Review of Systems  Updated Vital Signs BP (!) 142/76   Pulse 100   Temp 98.4 F (36.9 C) (Oral)   Resp 19   Ht 5' 4 (1.626 m)   Wt 108.9 kg   SpO2 99%   BMI 41.20 kg/m   Physical Exam  (all labs ordered are listed, but only abnormal results are displayed) Labs Reviewed  BASIC METABOLIC PANEL WITH GFR - Abnormal; Notable for the following components:      Result Value  Glucose, Bld 126 (*)    All other components within normal limits  CBC  URINALYSIS, ROUTINE W REFLEX MICROSCOPIC  PREGNANCY, URINE    EKG: None  Radiology: No results found.  {Document cardiac monitor, telemetry assessment procedure when appropriate:32947} Procedures   Medications Ordered in the ED - No data to display    {Click here for ABCD2, HEART and other calculators REFRESH Note before signing:1}                              Medical Decision Making Amount and/or Complexity of Data Reviewed Labs: ordered.   ***  {Document critical care time when appropriate  Document review of labs and clinical decision tools ie CHADS2VASC2, etc  Document  your independent review of radiology images and any outside records  Document your discussion with family members, caretakers and with consultants  Document social determinants of health affecting pt's care  Document your decision making why or why not admission, treatments were needed:32947:::1}   Final diagnoses:  None    ED Discharge Orders     None

## 2023-12-15 NOTE — ED Notes (Signed)
 Pt unable to void at this time.MD aware.

## 2023-12-15 NOTE — ED Notes (Signed)
 Assisted pt to restroom (standby assist) for urine specimen.

## 2023-12-16 ENCOUNTER — Emergency Department (HOSPITAL_BASED_OUTPATIENT_CLINIC_OR_DEPARTMENT_OTHER)

## 2023-12-16 DIAGNOSIS — R10A3 Flank pain, bilateral: Secondary | ICD-10-CM | POA: Diagnosis not present

## 2023-12-16 DIAGNOSIS — R188 Other ascites: Secondary | ICD-10-CM | POA: Diagnosis not present

## 2023-12-16 DIAGNOSIS — Z9049 Acquired absence of other specified parts of digestive tract: Secondary | ICD-10-CM | POA: Diagnosis not present

## 2023-12-16 DIAGNOSIS — K449 Diaphragmatic hernia without obstruction or gangrene: Secondary | ICD-10-CM | POA: Diagnosis not present

## 2023-12-16 LAB — URINALYSIS, ROUTINE W REFLEX MICROSCOPIC
Bacteria, UA: NONE SEEN
Bilirubin Urine: NEGATIVE
Glucose, UA: NEGATIVE mg/dL
Ketones, ur: NEGATIVE mg/dL
Leukocytes,Ua: NEGATIVE
Nitrite: NEGATIVE
Specific Gravity, Urine: >1.046 — ABNORMAL HIGH (ref 1.005–1.030)
pH: 6.5 (ref 5.0–8.0)

## 2023-12-16 LAB — TROPONIN T, HIGH SENSITIVITY: Troponin T High Sensitivity: <15 ng/L (ref 0–19)

## 2023-12-16 LAB — PREGNANCY, URINE: Preg Test, Ur: NEGATIVE

## 2023-12-16 MED ORDER — IOHEXOL 350 MG/ML SOLN
100.0000 mL | Freq: Once | INTRAVENOUS | Status: AC | PRN
  Administered 2023-12-16: 100 mL via INTRAVENOUS

## 2023-12-16 NOTE — ED Provider Notes (Signed)
 Patient signed out pending lab work and CT imaging.  Lab workup is unremarkable.  CT imaging of the chest, abdomen, and pelvis is also unremarkable.  On my repeat evaluation she is awake and alert.  Vital signs are stable.  Discussed with her workup in absence of acute emergent finding.  Will have her follow-up with her primary doctor.  Patient is agreeable to plan. Physical Exam  BP 116/67   Pulse 65   Temp 98.2 F (36.8 C) (Oral)   Resp 14   Ht 1.626 m (5' 4)   Wt 108.9 kg   SpO2 97%   BMI 41.20 kg/m   Physical Exam Awake, alert, no acute distress. Procedures  Procedures  ED Course / MDM    Medical Decision Making Amount and/or Complexity of Data Reviewed Labs: ordered. Radiology: ordered.  Risk Prescription drug management.   Problem List Items Addressed This Visit   None Visit Diagnoses       Bilateral flank pain    -  Primary             Bari Charmaine FALCON, MD 12/16/23 [phone]

## 2023-12-16 NOTE — Discharge Instructions (Signed)
 You were seen today for flank pain.  Your workup today is reassuring including lab testing and CT imaging.  Follow-up with your primary physician.

## 2023-12-18 ENCOUNTER — Emergency Department (HOSPITAL_BASED_OUTPATIENT_CLINIC_OR_DEPARTMENT_OTHER)
Admission: EM | Admit: 2023-12-18 | Discharge: 2023-12-19 | Disposition: A | Attending: Emergency Medicine | Admitting: Emergency Medicine

## 2023-12-18 ENCOUNTER — Encounter (HOSPITAL_BASED_OUTPATIENT_CLINIC_OR_DEPARTMENT_OTHER): Payer: Self-pay

## 2023-12-18 DIAGNOSIS — M549 Dorsalgia, unspecified: Secondary | ICD-10-CM | POA: Diagnosis not present

## 2023-12-18 DIAGNOSIS — R109 Unspecified abdominal pain: Secondary | ICD-10-CM | POA: Diagnosis not present

## 2023-12-18 DIAGNOSIS — R10A0 Flank pain, unspecified side: Secondary | ICD-10-CM | POA: Diagnosis not present

## 2023-12-18 DIAGNOSIS — R3911 Hesitancy of micturition: Secondary | ICD-10-CM | POA: Diagnosis not present

## 2023-12-18 LAB — CBC
HCT: 37.6 % (ref 36.0–46.0)
Hemoglobin: 12.7 g/dL (ref 12.0–15.0)
MCH: 31.4 pg (ref 26.0–34.0)
MCHC: 33.8 g/dL (ref 30.0–36.0)
MCV: 93.1 fL (ref 80.0–100.0)
Platelets: 255 K/uL (ref 150–400)
RBC: 4.04 MIL/uL (ref 3.87–5.11)
RDW: 12.2 % (ref 11.5–15.5)
WBC: 8.8 K/uL (ref 4.0–10.5)
nRBC: 0 % (ref 0.0–0.2)

## 2023-12-18 LAB — COMPREHENSIVE METABOLIC PANEL WITH GFR
ALT: 36 U/L (ref 0–44)
AST: 21 U/L (ref 15–41)
Albumin: 4.2 g/dL (ref 3.5–5.0)
Alkaline Phosphatase: 76 U/L (ref 38–126)
Anion gap: 9 (ref 5–15)
BUN: 11 mg/dL (ref 6–20)
CO2: 22 mmol/L (ref 22–32)
Calcium: 9 mg/dL (ref 8.9–10.3)
Chloride: 108 mmol/L (ref 98–111)
Creatinine, Ser: 0.72 mg/dL (ref 0.44–1.00)
GFR, Estimated: >60 mL/min (ref >60)
Glucose, Bld: 82 mg/dL (ref 70–99)
Potassium: 3.8 mmol/L (ref 3.5–5.1)
Sodium: 139 mmol/L (ref 135–145)
Total Bilirubin: 0.4 mg/dL (ref 0.0–1.2)
Total Protein: 6.6 g/dL (ref 6.5–8.1)

## 2023-12-18 LAB — URINALYSIS, ROUTINE W REFLEX MICROSCOPIC
Bacteria, UA: NONE SEEN
Bilirubin Urine: NEGATIVE
Glucose, UA: NEGATIVE mg/dL
Ketones, ur: NEGATIVE mg/dL
Leukocytes,Ua: NEGATIVE
Nitrite: NEGATIVE
Protein, ur: NEGATIVE mg/dL
Specific Gravity, Urine: 1.014 (ref 1.005–1.030)
pH: 6 (ref 5.0–8.0)

## 2023-12-18 LAB — PREGNANCY, URINE: Preg Test, Ur: NEGATIVE

## 2023-12-18 LAB — LIPASE, BLOOD: Lipase: 20 U/L (ref 11–51)

## 2023-12-18 NOTE — ED Triage Notes (Signed)
 Pt c/o hard time urinating, flank pain (bilat but worse on R), facial swelling, swelling in hands/ feet. Reports issue was very mild when I was here last time, but it's worse today. Reports constant thirst, only emptying bladder 1x day.

## 2023-12-18 NOTE — ED Notes (Signed)
Blood draw unsuccessful x 1

## 2023-12-19 NOTE — Discharge Instructions (Signed)
 See your primary care physicain for recheck next week

## 2023-12-19 NOTE — ED Provider Notes (Signed)
 [place] EMERGENCY DEPARTMENT AT [facility] Provider Note   CSN: [id number] Arrival date & time: 12/18/23  1859     Patient presents with: No chief complaint on file.   Cynthia Frazier is a 33 y.o. female.   Patient complains of feeling like she is unable to urinate.  Patient reports that she has urinated twice at home today.  Patient states that she has a history of kidney problems.  Patient states that she was told that she had kidney problems because she had strep throat.  Patient states she has not seen anyone for evaluation of her kidneys in several years.  Patient denies any fever or chills she is not having any nausea or vomiting.  Patient complains of feeling like she has swelling in her hands and her legs.  Patient denies any fever or chills.  She complains of pain in her back.  The history is provided by the patient and the spouse. No language interpreter was used.       Prior to Admission medications   Medication Sig Start Date End Date Taking? Authorizing Provider  busPIRone (BUSPAR) 15 MG tablet Take 20 mg by mouth 3 (three) times daily.    [provider]  ciclopirox  (PENLAC ) 8 % solution Apply topically at bedtime. Apply over nail and surrounding skin. Apply daily over previous coat. After seven (7) days, may remove with alcohol and continue cycle. 06/04/21   Trudy Gibson B, PA-C  ergocalciferol (VITAMIN D2) 1.25 MG (50000 UT) capsule  12/23/19   [provider]  Ferrous Sulfate (IRON PO) Take by mouth.    [provider]  fluconazole  (DIFLUCAN ) 150 MG tablet Take 1 tablet (150 mg total) by mouth daily. Repeat in 1 week if needed 05/27/22   Maranda Jamee Jacob, MD  fluticasone  (FLONASE ) 50 MCG/ACT nasal spray Place 2 sprays into both nostrils daily. 04/08/22   Kennyth Domino, FNP  ibuprofen  (ADVIL ) 400 MG tablet Take by mouth. 07/18/15   [provider]  lamoTRIgine  (LAMICTAL ) 25 MG tablet TAKE 2 TABLETS BY MOUTH DAILY 12/03/21    Joyce Norleen BROCKS, MD  loperamide  (IMODIUM ) 2 MG capsule Take 1 capsule (2 mg total) by mouth 4 (four) times daily as needed for diarrhea or loose stools. 08/17/23   Barrett, Warren SAILOR, PA-C  MIDODRINE HCL PO Take by mouth.    [provider]  montelukast  (SINGULAIR ) 10 MG tablet TAKE 1 TABLET BY MOUTH AT BEDTIME 01/13/22   Jeneal Danita Macintosh, MD  naproxen  (NAPROSYN ) 500 MG tablet Take 1 tablet (500 mg total) by mouth 2 (two) times daily with a meal. 11/05/22   Christopher Savannah, PA-C  Olopatadine  HCl 0.2 % SOLN Apply 1 drop to eye 2 (two) times daily. 07/18/21   Jeneal Danita Macintosh, MD  Olopatadine -Mometasone  (RYALTRIS ) [phone] MCG/ACT SUSP Place 2 sprays into the nose 2 (two) times daily. 07/18/21   Jeneal Danita Macintosh, MD  ondansetron  (ZOFRAN -ODT) 4 MG disintegrating tablet Take 1 tablet (4 mg total) by mouth every 8 (eight) hours as needed for nausea or vomiting. 08/17/23   Barrett, Warren SAILOR, PA-C  predniSONE  (DELTASONE ) 50 MG tablet Take 1 tablet (50 mg total) by mouth daily. 02/24/23   Raford Lenis, MD  promethazine  (PHENERGAN ) 25 MG tablet Take 1 tablet (25 mg total) by mouth every 6 (six) hours as needed for nausea or vomiting. 04/13/23   Elnor Hila P, DO  sertraline  (ZOLOFT ) 100 MG tablet TAKE 1 TABLET BY MOUTH DAILY 12/03/21   Joyce,  Norleen BROCKS, MD  sertraline  (ZOLOFT ) 100 MG tablet Take by mouth.    [provider]    Allergies: Latex, Sumatriptan, Vancomycin, Avocado, Banana, Kiwi extract, and Levofloxacin    Review of Systems  Genitourinary:  Positive for decreased urine volume.  All other systems reviewed and are negative.   Updated Vital Signs BP 119/79 (BP Location: Right Arm)   Pulse 63   Temp 98.7 F (37.1 C) (Oral)   Resp 16   LMP  (LMP Unknown)   SpO2 98%   Physical Exam Vitals and nursing note reviewed.  Constitutional:      Appearance: She is well-developed.  HENT:     Head: Normocephalic.     Mouth/Throat:     Mouth: Mucous membranes are  moist.  Cardiovascular:     Rate and Rhythm: Normal rate.  Pulmonary:     Effort: Pulmonary effort is normal.  Abdominal:     Palpations: Abdomen is soft. There is no mass.     Tenderness: There is no abdominal tenderness.  Musculoskeletal:        General: Normal range of motion.     Cervical back: Normal range of motion.  Skin:    General: Skin is warm.  Neurological:     General: No focal deficit present.     Mental Status: She is alert and oriented to person, place, and time.     (all labs ordered are listed, but only abnormal results are displayed) Labs Reviewed  URINALYSIS, ROUTINE W REFLEX MICROSCOPIC - Abnormal; Notable for the following components:      Result Value   Color, Urine COLORLESS (*)    Hgb urine dipstick SMALL (*)    All other components within normal limits  LIPASE, BLOOD  COMPREHENSIVE METABOLIC PANEL WITH GFR  CBC  PREGNANCY, URINE    EKG: None  Radiology: No results found.   Procedures   Medications Ordered in the ED - No data to display                                  Medical Decision Making Patient complains of feeling like she is not urinating is much as she can.  Patient complains of feeling like she is swollen.  Amount and/or Complexity of Data Reviewed Independent Historian: spouse    Details: Patient is here with her spouse who is supportive External Data Reviewed: notes.    Details: ED evaluation from [facility] reviewed, ED visit from 4 days ago reviewed.  Patient had a normal CT angio chest and normal CT abdomen and pelvis Labs: ordered. Decision-making details documented in ED Course.    Details: Labs ordered reviewed and interpreted.  UA is negative.  Patient has normal white blood cell count renal functions are normal        Final diagnoses:  Flank pain, unspecified laterality  Urinary hesitancy    ED Discharge Orders     None      An After Visit Summary was printed and given to the patient.     Flint Sonny POUR, PA-C 12/19/23 0041    Zackowski, Scott, MD 12/20/23 1228

## 2023-12-22 DIAGNOSIS — N898 Other specified noninflammatory disorders of vagina: Secondary | ICD-10-CM | POA: Diagnosis not present

## 2023-12-22 DIAGNOSIS — D280 Benign neoplasm of vulva: Secondary | ICD-10-CM | POA: Diagnosis not present

## 2023-12-29 DIAGNOSIS — R10A1 Flank pain, right side: Secondary | ICD-10-CM | POA: Diagnosis not present

## 2023-12-30 ENCOUNTER — Emergency Department (HOSPITAL_BASED_OUTPATIENT_CLINIC_OR_DEPARTMENT_OTHER)
Admission: EM | Admit: 2023-12-30 | Discharge: 2023-12-30 | Disposition: A | Discharge status: Home / Self Care | Source: Home / Self Care | Attending: Emergency Medicine | Admitting: Emergency Medicine

## 2023-12-30 ENCOUNTER — Emergency Department (HOSPITAL_BASED_OUTPATIENT_CLINIC_OR_DEPARTMENT_OTHER)

## 2023-12-30 ENCOUNTER — Other Ambulatory Visit: Payer: Self-pay

## 2023-12-30 ENCOUNTER — Telehealth (HOSPITAL_BASED_OUTPATIENT_CLINIC_OR_DEPARTMENT_OTHER): Payer: Self-pay | Admitting: Emergency Medicine

## 2023-12-30 ENCOUNTER — Encounter (HOSPITAL_BASED_OUTPATIENT_CLINIC_OR_DEPARTMENT_OTHER): Payer: Self-pay | Admitting: Emergency Medicine

## 2023-12-30 DIAGNOSIS — Z6841 Body Mass Index (BMI) 40.0 and over, adult: Secondary | ICD-10-CM | POA: Diagnosis not present

## 2023-12-30 DIAGNOSIS — R197 Diarrhea, unspecified: Secondary | ICD-10-CM | POA: Insufficient documentation

## 2023-12-30 DIAGNOSIS — K529 Noninfective gastroenteritis and colitis, unspecified: Secondary | ICD-10-CM | POA: Diagnosis not present

## 2023-12-30 DIAGNOSIS — R55 Syncope and collapse: Secondary | ICD-10-CM | POA: Insufficient documentation

## 2023-12-30 DIAGNOSIS — E11649 Type 2 diabetes mellitus with hypoglycemia without coma: Secondary | ICD-10-CM | POA: Diagnosis not present

## 2023-12-30 DIAGNOSIS — R1110 Vomiting, unspecified: Secondary | ICD-10-CM | POA: Diagnosis present

## 2023-12-30 DIAGNOSIS — F1090 Alcohol use, unspecified, uncomplicated: Secondary | ICD-10-CM | POA: Diagnosis not present

## 2023-12-30 DIAGNOSIS — R531 Weakness: Secondary | ICD-10-CM | POA: Insufficient documentation

## 2023-12-30 DIAGNOSIS — J45909 Unspecified asthma, uncomplicated: Secondary | ICD-10-CM | POA: Diagnosis not present

## 2023-12-30 DIAGNOSIS — E162 Hypoglycemia, unspecified: Secondary | ICD-10-CM | POA: Diagnosis not present

## 2023-12-30 DIAGNOSIS — Z91040 Latex allergy status: Secondary | ICD-10-CM | POA: Diagnosis not present

## 2023-12-30 DIAGNOSIS — F419 Anxiety disorder, unspecified: Secondary | ICD-10-CM | POA: Diagnosis not present

## 2023-12-30 DIAGNOSIS — E559 Vitamin D deficiency, unspecified: Secondary | ICD-10-CM | POA: Diagnosis not present

## 2023-12-30 DIAGNOSIS — A498 Other bacterial infections of unspecified site: Secondary | ICD-10-CM | POA: Diagnosis not present

## 2023-12-30 DIAGNOSIS — F331 Major depressive disorder, recurrent, moderate: Secondary | ICD-10-CM | POA: Diagnosis not present

## 2023-12-30 LAB — GASTROINTESTINAL PANEL BY PCR, STOOL (REPLACES STOOL CULTURE)

## 2023-12-30 LAB — COMPREHENSIVE METABOLIC PANEL WITH GFR
ALT: 37 U/L (ref 0–44)
AST: 28 U/L (ref 15–41)
Albumin: 4.3 g/dL (ref 3.5–5.0)
Alkaline Phosphatase: 78 U/L (ref 38–126)
Anion gap: 11 (ref 5–15)
BUN: 14 mg/dL (ref 6–20)
CO2: 22 mmol/L (ref 22–32)
Calcium: 9.3 mg/dL (ref 8.9–10.3)
Chloride: 108 mmol/L (ref 98–111)
Creatinine, Ser: 0.78 mg/dL (ref 0.44–1.00)
GFR, Estimated: >60 mL/min (ref >60)
Glucose, Bld: 64 mg/dL — ABNORMAL LOW (ref 70–99)
Potassium: 4.2 mmol/L (ref 3.5–5.1)
Sodium: 141 mmol/L (ref 135–145)
Total Bilirubin: 0.4 mg/dL (ref 0.0–1.2)
Total Protein: 7 g/dL (ref 6.5–8.1)

## 2023-12-30 LAB — URINALYSIS, ROUTINE W REFLEX MICROSCOPIC
Bilirubin Urine: NEGATIVE
Glucose, UA: NEGATIVE mg/dL
Ketones, ur: NEGATIVE mg/dL
Leukocytes,Ua: NEGATIVE
Nitrite: NEGATIVE
Protein, ur: NEGATIVE mg/dL
Specific Gravity, Urine: 1.022 (ref 1.005–1.030)
pH: 6.5 (ref 5.0–8.0)

## 2023-12-30 LAB — TROPONIN T, HIGH SENSITIVITY
Troponin T High Sensitivity: <15 ng/L (ref 0–19)
Troponin T High Sensitivity: <15 ng/L (ref 0–19)

## 2023-12-30 LAB — CBC
HCT: 40.5 % (ref 36.0–46.0)
Hemoglobin: 13.5 g/dL (ref 12.0–15.0)
MCH: 31.6 pg (ref 26.0–34.0)
MCHC: 33.3 g/dL (ref 30.0–36.0)
MCV: 94.8 fL (ref 80.0–100.0)
Platelets: 246 K/uL (ref 150–400)
RBC: 4.27 MIL/uL (ref 3.87–5.11)
RDW: 13.1 % (ref 11.5–15.5)
WBC: 8.3 K/uL (ref 4.0–10.5)
nRBC: 0 % (ref 0.0–0.2)

## 2023-12-30 LAB — C DIFFICILE QUICK SCREEN W PCR REFLEX
C Diff antigen: NEGATIVE
C Diff interpretation: NOT DETECTED
C Diff toxin: NEGATIVE

## 2023-12-30 LAB — CBG MONITORING, ED
Glucose-Capillary: 75 mg/dL (ref 70–99)
Glucose-Capillary: 48 mg/dL — ABNORMAL LOW (ref 70–99)
Glucose-Capillary: 150 mg/dL — ABNORMAL HIGH (ref 70–99)
Glucose-Capillary: 62 mg/dL — ABNORMAL LOW (ref 70–99)
Glucose-Capillary: 71 mg/dL (ref 70–99)
Glucose-Capillary: 79 mg/dL (ref 70–99)

## 2023-12-30 LAB — PREGNANCY, URINE: Preg Test, Ur: NEGATIVE

## 2023-12-30 LAB — LIPASE, BLOOD: Lipase: 25 U/L (ref 11–51)

## 2023-12-30 MED ORDER — DEXTROSE 50 % IV SOLN
50.0000 mL | Freq: Once | INTRAVENOUS | Status: AC
  Administered 2023-12-30: 50 mL via INTRAVENOUS

## 2023-12-30 MED ORDER — ONDANSETRON 4 MG PO TBDP: 4.0000 mg | ORAL_TABLET | Freq: Three times a day (TID) | ORAL | 0 refills | Status: AC | PRN

## 2023-12-30 MED ORDER — LACTATED RINGERS IV BOLUS
1000.0000 mL | Freq: Once | INTRAVENOUS | Status: AC
  Administered 2023-12-30: 1000 mL via INTRAVENOUS

## 2023-12-30 MED ORDER — IOHEXOL 300 MG/ML  SOLN
100.0000 mL | Freq: Once | INTRAMUSCULAR | Status: AC | PRN
  Administered 2023-12-30: 100 mL via INTRAVENOUS

## 2023-12-30 MED ORDER — ONDANSETRON HCL 4 MG/2ML IJ SOLN
4.0000 mg | Freq: Once | INTRAMUSCULAR | Status: AC
  Administered 2023-12-30: 4 mg via INTRAVENOUS
  Filled 2023-12-30: qty 2

## 2023-12-30 NOTE — ED Notes (Signed)
 Patient reassessed in triage. CBG noted to be 48. Patient alert and oriented and able to tolerate PO intake at this time. Snacks and juice given to patient.

## 2023-12-30 NOTE — Discharge Instructions (Signed)
 Keep yourself hydrated.  Advance her diet slowly as tolerated.  You will be called if your stool studies are positive and you need antibiotics.  Return to the ED with new or worsening symptoms.

## 2023-12-30 NOTE — ED Triage Notes (Signed)
 Diarrhea. Dizziness, blurred vision CBG was low 40. Ate and CBG went up to 90. Re-checked and was found to be 44.

## 2023-12-30 NOTE — ED Provider Notes (Signed)
 [place] EMERGENCY DEPARTMENT AT [facility] Provider Note   CSN: [id number] Arrival date & time: 12/30/23  9943     Patient presents with: Hypoglycemia and Near Syncope   Val Leopard is a 33 y.o. female.   Patient presents with generalized weakness, diarrhea, dizziness, hypoglycemia.  States has been ill for the past 1 week with diarrhea.  Happening 3-5 times daily is nonbloody.  Husband has cancer and is on immunotherapy has diarrhea as well.  No travel or sick contacts.  No recent antibiotic use.  Around 8 PM today she began to feel dizzy and lightheaded and checked her blood sugar and found it to be in the 40s at home.  She is not a diabetic and does not take any medication for her blood sugar.  She attempted to eat something and the sugar came up to 90 but then dropped again back to the 40s.  She is no longer feeling dizzy and lightheaded.  No chest pain or shortness of breath.  Having some diffuse abdominal cramping.  Nausea but no vomiting.  No fever.  No travel or sick contacts or recent antibiotic use.  Does not take anything at home for her blood sugar.  Denies headache.  Denies visual changes.  No focal weakness, numbness or tingling.  No black or bloody stools.  She no longer feels dizzy and lightheaded.  No difficulty speaking or difficulty swallowing.  The history is provided by the patient.  Hypoglycemia Associated symptoms: dizziness and weakness   Associated symptoms: no shortness of breath and no vomiting   Near Syncope Associated symptoms include abdominal pain. Pertinent negatives include no chest pain and no shortness of breath.       Prior to Admission medications   Medication Sig Start Date End Date Taking? Authorizing Provider  busPIRone (BUSPAR) 15 MG tablet Take 20 mg by mouth 3 (three) times daily.    [provider]  ciclopirox  (PENLAC ) 8 % solution Apply topically at bedtime. Apply over nail and surrounding skin. Apply daily over previous  coat. After seven (7) days, may remove with alcohol and continue cycle. 06/04/21   Trudy Gibson B, PA-C  ergocalciferol (VITAMIN D2) 1.25 MG (50000 UT) capsule  12/23/19   [provider]  Ferrous Sulfate (IRON PO) Take by mouth.    [provider]  fluconazole  (DIFLUCAN ) 150 MG tablet Take 1 tablet (150 mg total) by mouth daily. Repeat in 1 week if needed 05/27/22   Maranda Jamee Jacob, MD  fluticasone  (FLONASE ) 50 MCG/ACT nasal spray Place 2 sprays into both nostrils daily. 04/08/22   Kennyth Domino, FNP  ibuprofen  (ADVIL ) 400 MG tablet Take by mouth. 07/18/15   [provider]  lamoTRIgine  (LAMICTAL ) 25 MG tablet TAKE 2 TABLETS BY MOUTH DAILY 12/03/21   Joyce Norleen BROCKS, MD  loperamide  (IMODIUM ) 2 MG capsule Take 1 capsule (2 mg total) by mouth 4 (four) times daily as needed for diarrhea or loose stools. 08/17/23   Barrett, Warren SAILOR, PA-C  MIDODRINE HCL PO Take by mouth.    [provider]  montelukast  (SINGULAIR ) 10 MG tablet TAKE 1 TABLET BY MOUTH AT BEDTIME 01/13/22   Jeneal Danita Macintosh, MD  naproxen  (NAPROSYN ) 500 MG tablet Take 1 tablet (500 mg total) by mouth 2 (two) times daily with a meal. 11/05/22   Christopher Savannah, PA-C  Olopatadine  HCl 0.2 % SOLN Apply 1 drop to eye 2 (two) times daily. 07/18/21   Jeneal Danita Macintosh, MD  Olopatadine -Mometasone  (RYALTRIS )  334-74 MCG/ACT SUSP Place 2 sprays into the nose 2 (two) times daily. 07/18/21   Jeneal Danita Macintosh, MD  ondansetron  (ZOFRAN -ODT) 4 MG disintegrating tablet Take 1 tablet (4 mg total) by mouth every 8 (eight) hours as needed for nausea or vomiting. 08/17/23   Barrett, Warren SAILOR, PA-C  predniSONE  (DELTASONE ) 50 MG tablet Take 1 tablet (50 mg total) by mouth daily. 02/24/23   Raford Lenis, MD  promethazine  (PHENERGAN ) 25 MG tablet Take 1 tablet (25 mg total) by mouth every 6 (six) hours as needed for nausea or vomiting. 04/13/23   Elnor Hila P, DO  sertraline  (ZOLOFT ) 100 MG tablet TAKE 1 TABLET BY  MOUTH DAILY 12/03/21   Joyce Norleen BROCKS, MD  sertraline  (ZOLOFT ) 100 MG tablet Take by mouth.    [provider]    Allergies: Latex, Sumatriptan, Vancomycin, Avocado, Banana, Kiwi extract, and Levofloxacin    Review of Systems  Constitutional:  Positive for activity change and appetite change. Negative for fever.  HENT:  Negative for congestion.   Respiratory:  Negative for cough, chest tightness and shortness of breath.   Cardiovascular:  Positive for near-syncope. Negative for chest pain.  Gastrointestinal:  Positive for abdominal pain, diarrhea and nausea. Negative for vomiting.  Genitourinary:  Negative for dysuria and hematuria.  Musculoskeletal:  Negative for arthralgias and myalgias.  Neurological:  Positive for dizziness, weakness and light-headedness. Negative for syncope.    all other systems are negative except as noted in the HPI and PMH.   Updated Vital Signs BP 135/80   Pulse 97   Temp 97.6 F (36.4 C)   Resp 20   LMP  (LMP Unknown)   SpO2 100%   Physical Exam Vitals and nursing note reviewed.  Constitutional:      General: She is not in acute distress.    Appearance: She is well-developed.  HENT:     Head: Normocephalic and atraumatic.     Mouth/Throat:     Pharynx: No oropharyngeal exudate.  Eyes:     Conjunctiva/sclera: Conjunctivae normal.     Pupils: Pupils are equal, round, and reactive to light.  Neck:     Comments: No meningismus. Cardiovascular:     Rate and Rhythm: Normal rate and regular rhythm.     Heart sounds: Normal heart sounds. No murmur heard. Pulmonary:     Effort: Pulmonary effort is normal. No respiratory distress.     Breath sounds: Normal breath sounds.  Abdominal:     Palpations: Abdomen is soft.     Tenderness: There is no abdominal tenderness. There is no guarding or rebound.  Musculoskeletal:        General: No tenderness. Normal range of motion.     Cervical back: Normal range of motion and neck supple.  Skin:     General: Skin is warm.  Neurological:     Mental Status: She is alert and oriented to person, place, and time.     Cranial Nerves: No cranial nerve deficit.     Motor: No abnormal muscle tone.     Coordination: Coordination normal.     Comments:  5/5 strength throughout. CN 2-12 intact.Equal grip strength.   Psychiatric:        Behavior: Behavior normal.     (all labs ordered are listed, but only abnormal results are displayed) Labs Reviewed  COMPREHENSIVE METABOLIC PANEL WITH GFR - Abnormal; Notable for the following components:      Result Value   Glucose, Bld 64 (*)  All other components within normal limits  URINALYSIS, ROUTINE W REFLEX MICROSCOPIC - Abnormal; Notable for the following components:   Hgb urine dipstick MODERATE (*)    Bacteria, UA RARE (*)    All other components within normal limits  CBG MONITORING, ED - Abnormal; Notable for the following components:   Glucose-Capillary 48 (*)    All other components within normal limits  CBG MONITORING, ED - Abnormal; Notable for the following components:   Glucose-Capillary 62 (*)    All other components within normal limits  CBG MONITORING, ED - Abnormal; Notable for the following components:   Glucose-Capillary 150 (*)    All other components within normal limits  C DIFFICILE QUICK SCREEN W PCR REFLEX    GASTROINTESTINAL PANEL BY PCR, STOOL (REPLACES STOOL CULTURE)  CBC  PREGNANCY, URINE  LIPASE, BLOOD  CBG MONITORING, ED  CBG MONITORING, ED  TROPONIN T, HIGH SENSITIVITY  TROPONIN T, HIGH SENSITIVITY    EKG: EKG Interpretation Date/Time:  Wednesday December 30 2023 01:11:40 EDT Ventricular Rate:  91 PR Interval:  122 QRS Duration:  84 QT Interval:  366 QTC Calculation: 450 R Axis:   32  Text Interpretation: Normal sinus rhythm Low voltage QRS Cannot rule out Anterior infarct , age undetermined Abnormal ECG When compared with ECG of 15-Dec-2023 22:47, PREVIOUS ECG IS PRESENT No significant change was found  Confirmed by Carita Senior [phone]) on 12/30/2023 3:37:55 AM  Radiology: No results found.   Procedures   Medications Ordered in the ED  dextrose 50 % solution 50 mL (has no administration in time range)  lactated ringers bolus 1,000 mL (has no administration in time range)  lactated ringers bolus 1,000 mL (has no administration in time range)  ondansetron  (ZOFRAN ) injection 4 mg (has no administration in time range)                                    Medical Decision Making Amount and/or Complexity of Data Reviewed Labs: ordered. Decision-making details documented in ED Course. Radiology: ordered and independent interpretation performed. Decision-making details documented in ED Course. ECG/medicine tests: ordered and independent interpretation performed. Decision-making details documented in ED Course.  Risk Prescription drug management.   1 week of diarrhea here with hyperglycemia, generalized weakness, dizziness.  Tachycardic on arrival which has improved.  Abdomen soft without peritoneal signs.  Nonfocal neurologic exam.  No ataxia or nystagmus.  5/5 strength throughout.  Abdomen soft without peritoneal signs.  Hypoglycemic on arrival despite p.o. fluids and juice. Will give IV fluids and dextrose.  Blood sugar improved to 150.  Patient feels improved.  EKG is sinus rhythm without acute ST changes.  No Brugada, no prolonged QT.  Heart rate improved to the 90s after IV fluids.  She was able to give a stool sample which is pending.  CT without acute findings.  Suspect hypoglycemia in the setting of poor p.o. intake.  Blood sugar has improved and patient is able to eat and drink.  Informed her stool studies are pending and she will get a call if results are positive.  Blood sugar has normalized.  Patient able to eat and drink.  Blood sugar in the 70s.  She feels well and is not dizzy and not lightheaded.  Denies abdominal pain. Troponin negative x 2 with low concern for  ACS.  Hypoglycemia likely secondary to poor p.o. intake.  Encouraged oral hydration at home and  PCP follow-up. UA negative for infection.  He has improved and tolerating p.o.  Discussed clinical diet, Vaseline as tolerated.  Follow-up with PCP for recheck.  Return to the ED with new or worsening symptoms.    Final diagnoses:  Hypoglycemia  Near syncope    ED Discharge Orders     None          Altonio Schwertner, Garnette, MD 12/30/23 [phone]

## 2023-12-30 NOTE — Telephone Encounter (Cosign Needed)
 Called patient to inform her of positive Shiga like toxin E. coli.  Still has diarrhea however is not bloody and she is without a fever and she is tolerating p.o.  Will hold off on antibiotics at this time.  Strict return precautions provided.  She will follow-up with PCP in the interim.

## 2023-12-31 ENCOUNTER — Other Ambulatory Visit: Payer: Self-pay

## 2023-12-31 ENCOUNTER — Encounter (HOSPITAL_BASED_OUTPATIENT_CLINIC_OR_DEPARTMENT_OTHER): Payer: Self-pay | Admitting: Emergency Medicine

## 2023-12-31 ENCOUNTER — Observation Stay (HOSPITAL_BASED_OUTPATIENT_CLINIC_OR_DEPARTMENT_OTHER)
Admission: EM | Admit: 2023-12-31 | Discharge: 2024-01-02 | Disposition: A | Discharge status: Home / Self Care | Attending: Internal Medicine | Admitting: Internal Medicine

## 2023-12-31 DIAGNOSIS — G903 Multi-system degeneration of the autonomic nervous system: Secondary | ICD-10-CM | POA: Insufficient documentation

## 2023-12-31 DIAGNOSIS — Z6841 Body Mass Index (BMI) 40.0 and over, adult: Secondary | ICD-10-CM | POA: Insufficient documentation

## 2023-12-31 DIAGNOSIS — E11649 Type 2 diabetes mellitus with hypoglycemia without coma: Secondary | ICD-10-CM | POA: Diagnosis not present

## 2023-12-31 DIAGNOSIS — R002 Palpitations: Secondary | ICD-10-CM | POA: Insufficient documentation

## 2023-12-31 DIAGNOSIS — F1090 Alcohol use, unspecified, uncomplicated: Secondary | ICD-10-CM | POA: Insufficient documentation

## 2023-12-31 DIAGNOSIS — E162 Hypoglycemia, unspecified: Secondary | ICD-10-CM | POA: Diagnosis not present

## 2023-12-31 DIAGNOSIS — K529 Noninfective gastroenteritis and colitis, unspecified: Secondary | ICD-10-CM | POA: Insufficient documentation

## 2023-12-31 DIAGNOSIS — J45909 Unspecified asthma, uncomplicated: Secondary | ICD-10-CM | POA: Insufficient documentation

## 2023-12-31 DIAGNOSIS — F331 Major depressive disorder, recurrent, moderate: Secondary | ICD-10-CM | POA: Insufficient documentation

## 2023-12-31 DIAGNOSIS — A498 Other bacterial infections of unspecified site: Secondary | ICD-10-CM | POA: Insufficient documentation

## 2023-12-31 DIAGNOSIS — R000 Tachycardia, unspecified: Secondary | ICD-10-CM | POA: Insufficient documentation

## 2023-12-31 DIAGNOSIS — F419 Anxiety disorder, unspecified: Secondary | ICD-10-CM | POA: Insufficient documentation

## 2023-12-31 DIAGNOSIS — E559 Vitamin D deficiency, unspecified: Secondary | ICD-10-CM | POA: Insufficient documentation

## 2023-12-31 DIAGNOSIS — Z91040 Latex allergy status: Secondary | ICD-10-CM | POA: Insufficient documentation

## 2023-12-31 DIAGNOSIS — E66813 Obesity, class 3: Secondary | ICD-10-CM | POA: Diagnosis present

## 2023-12-31 LAB — CBG MONITORING, ED
Glucose-Capillary: 45 mg/dL — ABNORMAL LOW (ref 70–99)
Glucose-Capillary: 89 mg/dL (ref 70–99)
Glucose-Capillary: 134 mg/dL — ABNORMAL HIGH (ref 70–99)
Glucose-Capillary: 35 mg/dL — CL (ref 70–99)
Glucose-Capillary: 87 mg/dL (ref 70–99)
Glucose-Capillary: 60 mg/dL — ABNORMAL LOW (ref 70–99)
Glucose-Capillary: 141 mg/dL — ABNORMAL HIGH (ref 70–99)

## 2023-12-31 LAB — COMPREHENSIVE METABOLIC PANEL WITH GFR
ALT: 43 U/L (ref 0–44)
AST: 24 U/L (ref 15–41)
Albumin: 4.6 g/dL (ref 3.5–5.0)
Alkaline Phosphatase: 90 U/L (ref 38–126)
Anion gap: 9 (ref 5–15)
BUN: <5 mg/dL — ABNORMAL LOW (ref 6–20)
CO2: 26 mmol/L (ref 22–32)
Calcium: 9.8 mg/dL (ref 8.9–10.3)
Chloride: 106 mmol/L (ref 98–111)
Creatinine, Ser: 0.75 mg/dL (ref 0.44–1.00)
GFR, Estimated: >60 mL/min (ref >60)
Glucose, Bld: 54 mg/dL — ABNORMAL LOW (ref 70–99)
Potassium: 4.1 mmol/L (ref 3.5–5.1)
Sodium: 142 mmol/L (ref 135–145)
Total Bilirubin: 0.5 mg/dL (ref 0.0–1.2)
Total Protein: 7.5 g/dL (ref 6.5–8.1)

## 2023-12-31 LAB — GLUCOSE, CAPILLARY
Glucose-Capillary: 107 mg/dL — ABNORMAL HIGH (ref 70–99)
Glucose-Capillary: 113 mg/dL — ABNORMAL HIGH (ref 70–99)
Glucose-Capillary: 118 mg/dL — ABNORMAL HIGH (ref 70–99)
Glucose-Capillary: 122 mg/dL — ABNORMAL HIGH (ref 70–99)
Glucose-Capillary: 124 mg/dL — ABNORMAL HIGH (ref 70–99)
Glucose-Capillary: 126 mg/dL — ABNORMAL HIGH (ref 70–99)
Glucose-Capillary: 130 mg/dL — ABNORMAL HIGH (ref 70–99)
Glucose-Capillary: 130 mg/dL — ABNORMAL HIGH (ref 70–99)
Glucose-Capillary: 64 mg/dL — ABNORMAL LOW (ref 70–99)
Glucose-Capillary: 73 mg/dL (ref 70–99)
Glucose-Capillary: 78 mg/dL (ref 70–99)
Glucose-Capillary: 89 mg/dL (ref 70–99)
Glucose-Capillary: 96 mg/dL (ref 70–99)

## 2023-12-31 LAB — CBC WITH DIFFERENTIAL/PLATELET
Abs Immature Granulocytes: 0.04 K/uL (ref 0.00–0.07)
Basophils Absolute: 0.1 K/uL (ref 0.0–0.1)
Basophils Relative: 0 %
Eosinophils Absolute: 0.1 K/uL (ref 0.0–0.5)
Eosinophils Relative: 1 %
HCT: 41 % (ref 36.0–46.0)
Hemoglobin: 13.8 g/dL (ref 12.0–15.0)
Immature Granulocytes: 0 %
Lymphocytes Relative: 14 %
Lymphs Abs: 1.6 K/uL (ref 0.7–4.0)
MCH: 31.7 pg (ref 26.0–34.0)
MCHC: 33.7 g/dL (ref 30.0–36.0)
MCV: 94 fL (ref 80.0–100.0)
Monocytes Absolute: 1.2 K/uL — ABNORMAL HIGH (ref 0.1–1.0)
Monocytes Relative: 10 %
Neutro Abs: 8.3 K/uL — ABNORMAL HIGH (ref 1.7–7.7)
Neutrophils Relative %: 75 %
Platelets: 291 K/uL (ref 150–400)
RBC: 4.36 MIL/uL (ref 3.87–5.11)
RDW: 13.1 % (ref 11.5–15.5)
WBC: 11.3 K/uL — ABNORMAL HIGH (ref 4.0–10.5)
nRBC: 0 % (ref 0.0–0.2)

## 2023-12-31 LAB — MRSA NEXT GEN BY PCR, NASAL: MRSA by PCR Next Gen: NOT DETECTED

## 2023-12-31 LAB — LIPASE, BLOOD: Lipase: 25 U/L (ref 11–51)

## 2023-12-31 LAB — BETA-HYDROXYBUTYRIC ACID: Beta-Hydroxybutyric Acid: 0.06 mmol/L (ref 0.05–0.27)

## 2023-12-31 MED ORDER — ENOXAPARIN SODIUM 60 MG/0.6ML IJ SOSY
60.0000 mg | PREFILLED_SYRINGE | INTRAMUSCULAR | Status: DC
  Administered 2023-12-31 – 2024-01-01 (×2): 60 mg via SUBCUTANEOUS
  Filled 2023-12-31 (×2): qty 0.6

## 2023-12-31 MED ORDER — QUETIAPINE FUMARATE 50 MG PO TABS
25.0000 mg | ORAL_TABLET | Freq: Every day | ORAL | Status: DC
  Filled 2023-12-31: qty 1

## 2023-12-31 MED ORDER — KETOROLAC TROMETHAMINE 30 MG/ML IJ SOLN
30.0000 mg | Freq: Once | INTRAMUSCULAR | Status: AC
  Administered 2023-12-31: 30 mg via INTRAVENOUS
  Filled 2023-12-31: qty 1

## 2023-12-31 MED ORDER — BUSPIRONE HCL 5 MG PO TABS
5.0000 mg | ORAL_TABLET | Freq: Three times a day (TID) | ORAL | Status: DC
  Administered 2023-12-31 – 2024-01-02 (×5): 5 mg via ORAL
  Filled 2023-12-31 (×5): qty 1

## 2023-12-31 MED ORDER — ONDANSETRON HCL 4 MG PO TABS: 4.0000 mg | ORAL_TABLET | Freq: Four times a day (QID) | ORAL | Status: DC | PRN

## 2023-12-31 MED ORDER — HYDROCORTISONE SOD SUC (PF) 100 MG IJ SOLR
100.0000 mg | Freq: Once | INTRAMUSCULAR | Status: AC
  Administered 2023-12-31: 100 mg via INTRAVENOUS
  Filled 2023-12-31: qty 2

## 2023-12-31 MED ORDER — ACETAMINOPHEN 650 MG RE SUPP: 650.0000 mg | Freq: Four times a day (QID) | RECTAL | Status: DC | PRN

## 2023-12-31 MED ORDER — ESCITALOPRAM OXALATE 20 MG PO TABS
10.0000 mg | ORAL_TABLET | Freq: Every day | ORAL | Status: DC
  Administered 2023-12-31 – 2024-01-02 (×3): 10 mg via ORAL
  Filled 2023-12-31 (×3): qty 1

## 2023-12-31 MED ORDER — DEXTROSE 10 % IV SOLN: INTRAVENOUS | Status: DC

## 2023-12-31 MED ORDER — MORPHINE SULFATE (PF) 4 MG/ML IV SOLN
4.0000 mg | INTRAVENOUS | Status: DC | PRN
Start: 2023-12-31 — End: 2024-01-02

## 2023-12-31 MED ORDER — DEXTROSE 50 % IV SOLN
50.0000 mL | Freq: Once | INTRAVENOUS | Status: AC
  Administered 2023-12-31: 50 mL via INTRAVENOUS
  Filled 2023-12-31: qty 50

## 2023-12-31 MED ORDER — ORAL CARE MOUTH RINSE: 15.0000 mL | OROMUCOSAL | Status: DC | PRN

## 2023-12-31 MED ORDER — DEXTROSE 50 % IV SOLN
25.0000 g | INTRAVENOUS | Status: DC | PRN
Start: 2023-12-31 — End: 2024-01-02

## 2023-12-31 MED ORDER — ACETAMINOPHEN 325 MG PO TABS: 650.0000 mg | ORAL_TABLET | Freq: Four times a day (QID) | ORAL | Status: DC | PRN

## 2023-12-31 MED ORDER — LACTATED RINGERS IV SOLN: INTRAVENOUS | Status: AC

## 2023-12-31 MED ORDER — ONDANSETRON HCL 4 MG/2ML IJ SOLN
4.0000 mg | Freq: Once | INTRAMUSCULAR | Status: AC
  Administered 2023-12-31: 4 mg via INTRAVENOUS
  Filled 2023-12-31: qty 2

## 2023-12-31 MED ORDER — CHLORHEXIDINE GLUCONATE CLOTH 2 % EX PADS
6.0000 | MEDICATED_PAD | Freq: Every day | CUTANEOUS | Status: DC
  Administered 2023-12-31 – 2024-01-01 (×2): 6 via TOPICAL

## 2023-12-31 MED ORDER — ACYCLOVIR 400 MG PO TABS
400.0000 mg | ORAL_TABLET | Freq: Two times a day (BID) | ORAL | Status: DC
  Administered 2023-12-31 – 2024-01-02 (×4): 400 mg via ORAL
  Filled 2023-12-31 (×4): qty 1

## 2023-12-31 MED ORDER — SODIUM CHLORIDE 0.9 % IV BOLUS
1000.0000 mL | Freq: Once | INTRAVENOUS | Status: AC
  Administered 2023-12-31: 1000 mL via INTRAVENOUS

## 2023-12-31 MED ORDER — ONDANSETRON HCL 4 MG/2ML IJ SOLN: 4.0000 mg | Freq: Four times a day (QID) | INTRAMUSCULAR | Status: DC | PRN

## 2023-12-31 MED ORDER — CLONAZEPAM 0.5 MG PO TABS: 0.5000 mg | ORAL_TABLET | Freq: Every day | ORAL | Status: DC | PRN

## 2023-12-31 MED ORDER — PRAZOSIN HCL 1 MG PO CAPS: 5.0000 mg | ORAL_CAPSULE | Freq: Every evening | ORAL | Status: DC | PRN

## 2023-12-31 MED ORDER — LORATADINE 10 MG PO TABS
10.0000 mg | ORAL_TABLET | Freq: Every day | ORAL | Status: DC
  Administered 2023-12-31 – 2024-01-02 (×3): 10 mg via ORAL
  Filled 2023-12-31 (×3): qty 1

## 2023-12-31 MED ORDER — PANTOPRAZOLE SODIUM 40 MG PO TBEC: 40.0000 mg | DELAYED_RELEASE_TABLET | Freq: Every day | ORAL | Status: DC | PRN

## 2023-12-31 NOTE — H&P (Signed)
 History and Physical    Patient: Cynthia Frazier FMW:[id number] DOB: [birth year] DOA: 12/31/2023 DOS: the patient was seen and examined on 12/31/2023 PCP: Verdia Lombard, MD  Patient coming from: Home  Chief Complaint:  Chief Complaint  Patient presents with   Emesis   HPI: Cynthia Frazier is a 33 y.o. female with medical history significant of anxiety,, dysphagia, GERD, class III obesity, asthma, onychomycosis, vitamin D deficiency who presented yesterday to the emergency department early morning yesterday with blurry vision and dizziness due to hypoglycemia.  She has been having diarrhea for the past week and has had trouble keeping up with her oral intake.  Her husband had similar symptoms, but those were self-limited.  She had 2 episodes of emesis in the emergency department on the second visit.  Her husband is a diabetic, but is only taking metformin.  They are very careful not to mix any other medications.  No constipation, melena or hematochezia.  No flank pain, dysuria, frequency or hematuria.  She denied fever, chills, rhinorrhea, sore throat, wheezing or hemoptysis.  No chest pain, palpitations, diaphoresis, PND, orthopnea or pitting edema of the lower extremities.  No polyuria, polydipsia or polyphagia.  ED course: Initial vital signs were temperature 97.8 F, pulse 72, respiration 18, BP 110/65 mmHg and O2 sat 100% on room air.  The patient received dextrose 50% 50 mL x 3, ketorolac  30 mg IVP, ondansetron  4 mg IVP x 2 and sodium chloride  1000 mL IV bolus.  Lab work: CBC showed a white count of 11.3, hemoglobin 13.8 g/dL and platelets 708.  Lipase was normal.  CMP showed a glucose of 54 and BUN of less than 5 mg/dL, but all other values were normal.  GI pathogen by PCR was positive for Shiga toxin.  Imaging: CT abdomen/pelvis with no acute findings or other significant abnormality.   Review of Systems: As mentioned in the history of present illness. All other systems reviewed and  are negative. Past Medical History:  Diagnosis Date   Anxiety    Depression    Dysphagia    Left upper quadrant abdominal pain 07/11/2020   Nausea and vomiting 07/11/2020   Throat pain    Vitamin D deficiency    Past Surgical History:  Procedure Laterality Date   CESAREAN SECTION     CHOLECYSTECTOMY     Social History:  reports that she has never smoked. She has never used smokeless tobacco. She reports current alcohol use. She reports that she does not use drugs.  Allergies  Allergen Reactions   Latex Anaphylaxis, Hives and Other (See Comments)   Sumatriptan Anaphylaxis and Hives   Vancomycin Anaphylaxis and Itching   Avocado Rash   Banana Rash   Kiwi Extract Rash   Levofloxacin Rash    Other Reaction(s): Bradycardia    Family History  Adopted: Yes    Prior to Admission medications   Medication Sig Start Date End Date Taking? Authorizing Provider  acyclovir (ZOVIRAX) 800 MG tablet Take by mouth. 12/17/23  Yes [provider]  prazosin  (MINIPRESS ) 5 MG capsule Take 5 mg by mouth at bedtime. 05/14/23  Yes [provider]  QUEtiapine (SEROQUEL) 50 MG tablet Take 50 mg by mouth. 12/07/23  Yes [provider]  busPIRone (BUSPAR) 15 MG tablet Take 20 mg by mouth 3 (three) times daily.    [provider]  ciclopirox  (PENLAC ) 8 % solution Apply topically at bedtime. Apply over nail and surrounding skin. Apply daily over previous coat. After seven (7)  days, may remove with alcohol and continue cycle. 06/04/21   Trudy Hendricks NOVAK, PA-C  clonazePAM (KLONOPIN) 0.5 MG tablet Take 0.5 mg by mouth daily.    [provider]  ergocalciferol (VITAMIN D2) 1.25 MG (50000 UT) capsule  12/23/19   [provider]  estradiol (ESTRACE) 0.01 % CREA vaginal cream Place vaginally.    [provider]  Ferrous Sulfate (IRON PO) Take by mouth.    [provider]  fluconazole  (DIFLUCAN ) 150 MG tablet Take 1 tablet (150 mg total) by mouth  daily. Repeat in 1 week if needed 05/27/22   Maranda Jamee Jacob, MD  fluticasone  (FLONASE ) 50 MCG/ACT nasal spray Place 2 sprays into both nostrils daily. 04/08/22   Kennyth Domino, FNP  ibuprofen  (ADVIL ) 400 MG tablet Take by mouth. 07/18/15   [provider]  lamoTRIgine  (LAMICTAL ) 25 MG tablet TAKE 2 TABLETS BY MOUTH DAILY 12/03/21   Lalonde, John C, MD  LEXAPRO 5 MG tablet Take 5 mg by mouth daily.    [provider]  loperamide  (IMODIUM ) 2 MG capsule Take 1 capsule (2 mg total) by mouth 4 (four) times daily as needed for diarrhea or loose stools. 08/17/23   Barrett, Warren SAILOR, PA-C  MIDODRINE HCL PO Take by mouth.    [provider]  montelukast  (SINGULAIR ) 10 MG tablet TAKE 1 TABLET BY MOUTH AT BEDTIME 01/13/22   Jeneal Danita Macintosh, MD  naproxen  (NAPROSYN ) 500 MG tablet Take 1 tablet (500 mg total) by mouth 2 (two) times daily with a meal. 11/05/22   Christopher Savannah, PA-C  Olopatadine  HCl 0.2 % SOLN Apply 1 drop to eye 2 (two) times daily. 07/18/21   Jeneal Danita Macintosh, MD  Olopatadine -Mometasone  (RYALTRIS ) [phone] MCG/ACT SUSP Place 2 sprays into the nose 2 (two) times daily. 07/18/21   Jeneal Danita Macintosh, MD  ondansetron  (ZOFRAN -ODT) 4 MG disintegrating tablet Take 1 tablet (4 mg total) by mouth every 8 (eight) hours as needed. 12/30/23   Rancour, Garnette, MD  predniSONE  (DELTASONE ) 50 MG tablet Take 1 tablet (50 mg total) by mouth daily. 02/24/23   Raford Lenis, MD  promethazine  (PHENERGAN ) 25 MG tablet Take 1 tablet (25 mg total) by mouth every 6 (six) hours as needed for nausea or vomiting. 04/13/23   Elnor Hila P, DO  sertraline  (ZOLOFT ) 100 MG tablet TAKE 1 TABLET BY MOUTH DAILY 12/03/21   Joyce Norleen BROCKS, MD  sertraline  (ZOLOFT ) 100 MG tablet Take by mouth.    [provider]    Physical Exam: Vitals:   12/31/23 1030 12/31/23 1110 12/31/23 1244 12/31/23 1246  BP: 117/76 117/78  (!) 157/96  Pulse: 67 63  67  Resp: 13 18  20   Temp:    99.2 F  (37.3 C)  TempSrc:    Oral  SpO2: 100% 100%  100%  Weight:   107.5 kg   Height:   5' 2 (1.575 m)    Physical Exam Vitals and nursing note reviewed.  Constitutional:      Appearance: Normal appearance. She is ill-appearing.  HENT:     Head: Normocephalic.     Nose: No rhinorrhea.     Mouth/Throat:     Mouth: Mucous membranes are dry.  Eyes:     General: No scleral icterus.    Pupils: Pupils are equal, round, and reactive to light.  Cardiovascular:     Rate and Rhythm: Normal rate and regular rhythm.  Pulmonary:     Effort: Pulmonary effort is normal.  Breath sounds: No wheezing, rhonchi or rales.  Abdominal:     General: Bowel sounds are normal. There is no distension.     Palpations: Abdomen is soft.     Tenderness: There is abdominal tenderness. There is no right CVA tenderness, left CVA tenderness, guarding or rebound.  Musculoskeletal:     Cervical back: Neck supple.     Right lower leg: No edema.     Left lower leg: No edema.  Skin:    General: Skin is warm and dry.  Neurological:     General: No focal deficit present.     Mental Status: She is alert and oriented to person, place, and time.  Psychiatric:        Mood and Affect: Mood normal.        Behavior: Behavior normal.     Data Reviewed:  Results are pending, will review when available.  EKG: Vent. rate 91 BPM PR interval 122 ms QRS duration 84 ms QT/QTcB 366/450 ms P-R-T axes 108 32 39 Normal sinus rhythm Low voltage QRS Cannot rule out Anterior infarct , age undetermined Abnormal ECG  Assessment and Plan: Principal Problem:   Hypoglycemia Observation/stepdown. Continue D10 IV fluids. Continue LR IV fluids. Monitor CBG closely. Check plasma insulin, C-peptide, proinsulin. Check beta hydroxybutyrate level. Replace electrolytes as needed.  Active Problems:   Asthma Asymptomatic at this time. Bronchodilators as needed.    Obesity, Class III, BMI 40-49.9 (morbid obesity)  (HCC) Current BMI 43.35 kg/m. Continue lifestyle modifications. Follow-up closely with PCP and/or bariatric clinic.    Vitamin D deficiency Continue vitamin D supplementation.    Anxiety  Continue buspirone 5 mg p.o. twice daily.   Continue escitalopram 10 mg p.o. daily.    Advance Care Planning:   Code Status: Full Code   Consults:   Family Communication:   Severity of Illness: The appropriate patient status for this patient is OBSERVATION. Observation status is judged to be reasonable and necessary in order to provide the required intensity of service to ensure the patient's safety. The patient's presenting symptoms, physical exam findings, and initial radiographic and laboratory data in the context of their medical condition is felt to place them at decreased risk for further clinical deterioration. Furthermore, it is anticipated that the patient will be medically stable for discharge from the hospital within 2 midnights of admission.   Author: Alm Dorn Castor, MD 12/31/2023 12:57 PM  For on call review [email or web address].   This document was prepared using Dragon voice recognition software and may contain some unintended transcription errors.

## 2023-12-31 NOTE — ED Provider Notes (Signed)
  [place] EMERGENCY DEPARTMENT AT [facility] Provider Assume Care Note I assumed care of Cynthia Frazier on 12/31/2023 at 7 AM from Dr. Geroldine.   Briefly, Cynthia Frazier is a 33 y.o. female who: PMHx: Anxiety, depression, elevated BMI P/w dizziness, nausea, vomiting, diarrhea Was seen in the emergency department yesterday for similar complaints, stool cultures since have returned positive for Shiga toxin producing E. Coli Patient returned for continued symptoms, p.o. intolerance, dizziness, found to be hypoglycemic on arrival, received amp of D50 and fluids  Plan at the time of handoff: Continue to monitor sugar, if WNL consider p.o. trial to determine dispo   Please refer to the original provider's note for additional information regarding the care of Cynthia Frazier.  Reassessment: I personally reassessed the patient: Patient complained of recurrent dizziness  Vital Signs:  ED Triage Vitals  Encounter Vitals Group     BP 12/31/23 0540 110/65     Girls Systolic BP Percentile --      Girls Diastolic BP Percentile --      Boys Systolic BP Percentile --      Boys Diastolic BP Percentile --      Pulse Rate 12/31/23 0540 72     Resp 12/31/23 0540 18     Temp 12/31/23 0540 97.8 F (36.6 C)     Temp src --      SpO2 12/31/23 0540 100 %     Weight 12/31/23 0539 239 lb 13.8 oz (108.8 kg)     Height 12/31/23 0539 5' 4 (1.626 m)     Head Circumference --      Peak Flow --      Pain Score 12/31/23 0634 6     Pain Loc --      Pain Education --      Exclude from Growth Chart --      Hemodynamics:  The patient is hemodynamically stable. Mental Status:  The patient is alert  Additional MDM: Patient complained of recurrent dizziness, remains vitally stable, however repeat blood glucose recurrently low at 35.  Patient received amp of D50 a bit over an hour ago for a sugar of 45, given rapidity with which patient has had recurrence of hypoglycemia warrants initiation of D10  drip and LR infusion in addition to an additional amp of D50.  Given patient's ongoing symptoms, recurrent drop, do feel that patient warrants admission for monitoring of blood sugar, and continued hydration.  Given antibiotic use in the setting of as STEC can increase risk of HUS, will defer antibiotics at this time.  Medicine consulted for admission, discussed with Dr. Celinda who accepted the patient to his service.  Disposition: ADMIT: I believe the patient requires admission for further care and management. The patient was admitted to hospitalist service. Please see inpatient provider note for additional treatment plan details.    FREDRIK CANDIE Later, MD Emergency Medicine    Later Jerilynn RAMAN, MD 12/31/23 1000

## 2023-12-31 NOTE — ED Notes (Signed)
 Report given to [facility].

## 2023-12-31 NOTE — ED Notes (Signed)
 Report attempted... RN busy in Pt room.[email or web address]

## 2023-12-31 NOTE — Progress Notes (Signed)
 Plan of Care Note for accepted transfer   Patient: Cynthia Frazier MRN: [id number]   DOA: 12/31/2023  Facility requesting transfer: [facility]. Requesting Provider: Jerilynn Later, MD. Reason for transfer: Diarrhea and hypoglycemia. Facility course:  Per Dr. Carita:   Patient presents with generalized weakness, diarrhea, dizziness, hypoglycemia.  States has been ill for the past 1 week with diarrhea.  Happening 3-5 times daily is nonbloody.  Husband has cancer and is on immunotherapy has diarrhea as well.  No travel or sick contacts.  No recent antibiotic use.  Around 8 PM today she began to feel dizzy and lightheaded and checked her blood sugar and found it to be in the 40s at home.  She is not a diabetic and does not take any medication for her blood sugar.  She attempted to eat something and the sugar came up to 90 but then dropped again back to the 40s.  She is no longer feeling dizzy and lightheaded.  No chest pain or shortness of breath.  Having some diffuse abdominal cramping.  Nausea but no vomiting.  No fever.  No travel or sick contacts or recent antibiotic use.  Does not take anything at home for her blood sugar.  Denies headache.  Denies visual changes.  No focal weakness, numbness or tingling.  No black or bloody stools.  She no longer feels dizzy and lightheaded.  No difficulty speaking or difficulty swallowing.  Plan of care: The patient has received dextrose 50% and was started on the D10 infusion.  She has been accepted for admission to [facility] unit, at [facility].  Author: Alm Dorn Castor, MD 12/31/2023  Check [email or web address] for on-call coverage.  Nursing staff, Please call [facility] Admits & Consults System-Wide number on Amion as soon as patient's arrival, so appropriate admitting provider can evaluate the pt.

## 2023-12-31 NOTE — ED Notes (Signed)
Cynthia Frazier with cl called for transport

## 2023-12-31 NOTE — Plan of Care (Signed)
  Problem: Education: Goal: Knowledge of General Education information will improve Description: Including pain rating scale, medication(s)/side effects and non-pharmacologic comfort measures Outcome: Progressing   Problem: Clinical Measurements: Goal: Respiratory complications will improve Outcome: Progressing   Problem: Nutrition: Goal: Adequate nutrition will be maintained Outcome: Progressing   Problem: Coping: Goal: Level of anxiety will decrease Outcome: Progressing   Problem: Elimination: Goal: Will not experience complications related to urinary retention Outcome: Progressing   Problem: Safety: Goal: Ability to remain free from injury will improve Outcome: Progressing   Problem: Skin Integrity: Goal: Risk for impaired skin integrity will decrease Outcome: Progressing

## 2023-12-31 NOTE — ED Triage Notes (Signed)
 Patient reports ongoing dizziness and emesis. Seen here yesterday for same and found to have hypoglycemia. Patient reports she is unable to keep any food/fluids down. Also contacted today for positive e.coli on stool sample taken yesterday.

## 2023-12-31 NOTE — ED Provider Notes (Signed)
 Cynthia Frazier Provider Note   CSN: [id number] Arrival date & time: 12/31/23  [id number]     Patient presents with: Emesis   Cynthia Frazier is a 33 y.o. female.  {Add pertinent medical, surgical, social history, OB history to HPI:32947} Patient is a 33 year old female with past medical history of anxiety, depression.  Patient presenting today with complaints of nausea, vomiting, and dizziness.  She also describes diarrhea and being unable to keep any food or liquid down.  She was seen just over 24 hours ago with similar complaints.  Since her past visit, her stool has tested positive for Shiga toxin producing E. coli.  She was found to have low blood sugar on previous visit and fingerstick today upon presentation is 45.       Prior to Admission medications   Medication Sig Start Date End Date Taking? Authorizing Provider  busPIRone (BUSPAR) 15 MG tablet Take 20 mg by mouth 3 (three) times daily.    [provider]  ciclopirox  (PENLAC ) 8 % solution Apply topically at bedtime. Apply over nail and surrounding skin. Apply daily over previous coat. After seven (7) days, may remove with alcohol and continue cycle. 06/04/21   Trudy Gibson B, PA-C  ergocalciferol (VITAMIN D2) 1.25 MG (50000 UT) capsule  12/23/19   [provider]  Ferrous Sulfate (IRON PO) Take by mouth.    [provider]  fluconazole  (DIFLUCAN ) 150 MG tablet Take 1 tablet (150 mg total) by mouth daily. Repeat in 1 week if needed 05/27/22   Maranda Jamee Jacob, MD  fluticasone  (FLONASE ) 50 MCG/ACT nasal spray Place 2 sprays into both nostrils daily. 04/08/22   Kennyth Domino, FNP  ibuprofen  (ADVIL ) 400 MG tablet Take by mouth. 07/18/15   [provider]  lamoTRIgine  (LAMICTAL ) 25 MG tablet TAKE 2 TABLETS BY MOUTH DAILY 12/03/21   Joyce Norleen BROCKS, MD  loperamide  (IMODIUM ) 2 MG capsule Take 1 capsule (2 mg total) by mouth 4 (four) times daily as needed for diarrhea  or loose stools. 08/17/23   Barrett, Warren SAILOR, PA-C  MIDODRINE HCL PO Take by mouth.    [provider]  montelukast  (SINGULAIR ) 10 MG tablet TAKE 1 TABLET BY MOUTH AT BEDTIME 01/13/22   Jeneal Danita Macintosh, MD  naproxen  (NAPROSYN ) 500 MG tablet Take 1 tablet (500 mg total) by mouth 2 (two) times daily with a meal. 11/05/22   Christopher Savannah, PA-C  Olopatadine  HCl 0.2 % SOLN Apply 1 drop to eye 2 (two) times daily. 07/18/21   Jeneal Danita Macintosh, MD  Olopatadine -Mometasone  (RYALTRIS ) [phone] MCG/ACT SUSP Place 2 sprays into the nose 2 (two) times daily. 07/18/21   Jeneal Danita Macintosh, MD  ondansetron  (ZOFRAN -ODT) 4 MG disintegrating tablet Take 1 tablet (4 mg total) by mouth every 8 (eight) hours as needed. 12/30/23   Rancour, Garnette, MD  predniSONE  (DELTASONE ) 50 MG tablet Take 1 tablet (50 mg total) by mouth daily. 02/24/23   Raford Lenis, MD  promethazine  (PHENERGAN ) 25 MG tablet Take 1 tablet (25 mg total) by mouth every 6 (six) hours as needed for nausea or vomiting. 04/13/23   Elnor Hila P, DO  sertraline  (ZOLOFT ) 100 MG tablet TAKE 1 TABLET BY MOUTH DAILY 12/03/21   Joyce Norleen BROCKS, MD  sertraline  (ZOLOFT ) 100 MG tablet Take by mouth.    [provider]    Allergies: Latex, Sumatriptan, Vancomycin, Avocado, Banana, Kiwi extract, and Levofloxacin    Review of Systems  All other systems  reviewed and are negative.   Updated Vital Signs BP 110/65 (BP Location: Right Arm)   Pulse 72   Temp 97.8 F (36.6 C)   Resp 18   Ht 5' 4 (1.626 m)   Wt 108.8 kg   LMP  (LMP Unknown)   SpO2 100%   BMI 41.17 kg/m   Physical Exam Vitals and nursing note reviewed.  Constitutional:      General: She is not in acute distress.    Appearance: She is well-developed. She is not diaphoretic.  HENT:     Head: Normocephalic and atraumatic.  Cardiovascular:     Rate and Rhythm: Normal rate and regular rhythm.     Heart sounds: No murmur heard.    No friction rub. No  gallop.  Pulmonary:     Effort: Pulmonary effort is normal. No respiratory distress.     Breath sounds: Normal breath sounds. No wheezing.  Abdominal:     General: Bowel sounds are normal. There is no distension.     Palpations: Abdomen is soft.     Tenderness: There is no abdominal tenderness.  Musculoskeletal:        General: Normal range of motion.     Cervical back: Normal range of motion and neck supple.  Skin:    General: Skin is warm and dry.  Neurological:     General: No focal deficit present.     Mental Status: She is alert and oriented to person, place, and time.     (all labs ordered are listed, but only abnormal results are displayed) Labs Reviewed  CBG MONITORING, ED - Abnormal; Notable for the following components:      Result Value   Glucose-Capillary 45 (*)    All other components within normal limits  COMPREHENSIVE METABOLIC PANEL WITH GFR  CBC WITH DIFFERENTIAL/PLATELET  LIPASE, BLOOD    EKG: None  Radiology: CT ABDOMEN PELVIS W CONTRAST Result Date: 12/30/2023 EXAM: CT ABDOMEN AND PELVIS WITH CONTRAST 12/30/2023 05:01:42 AM TECHNIQUE: CT of the abdomen and pelvis was performed with the administration of intravenous contrast, 100mL of iohexol  (Omnipaque ) 300 mg/mL solution. Multiplanar reformatted images are provided for review. Automated exposure control, iterative reconstruction, and/or weight-based adjustment of the mA/kV was utilized to reduce the radiation dose to as low as reasonably achievable. COMPARISON: Comparison to CT dated 12/16/2023. CLINICAL HISTORY: Abdominal pain, acute, nonlocalized. FINDINGS: LOWER CHEST: No acute abnormality. LIVER: The liver is unremarkable. GALLBLADDER AND BILE DUCTS: Status post cholecystectomy. No biliary ductal dilatation. SPLEEN: No acute abnormality. PANCREAS: No acute abnormality. ADRENAL GLANDS: No acute abnormality. KIDNEYS, URETERS AND BLADDER: No stones in the kidneys or ureters. No hydronephrosis. No perinephric  or periureteral stranding. Urinary bladder is unremarkable. An intrauterine device (IUD) is seen in the appropriate position. GI AND BOWEL: Appendix is normal. Stomach demonstrates no acute abnormality. There is no bowel obstruction. PERITONEUM AND RETROPERITONEUM: No ascites. No free air. Clips from prior bilateral tubal ligation are noted in the pelvic cul-de-sac. VASCULATURE: Aorta is normal in caliber. LYMPH NODES: No lymphadenopathy. REPRODUCTIVE ORGANS: No acute abnormality. BONES AND SOFT TISSUES: No acute osseous abnormality. No focal soft tissue abnormality. IMPRESSION: 1. No acute findings or other significant abnormality. Electronically signed by: Norleen Kil MD 12/30/2023 05:13 AM EDT RP Workstation: [id number]    {Document cardiac monitor, telemetry assessment procedure when appropriate:32947} Procedures   Medications Ordered in the ED  sodium chloride  0.9 % bolus 1,000 mL (has no administration in time range)  dextrose 50 % solution  50 mL (has no administration in time range)      {Click here for ABCD2, HEART and other calculators REFRESH Note before signing:1}                              Medical Decision Making Amount and/or Complexity of Data Reviewed Labs: ordered.  Risk Prescription drug management.   ***  {Document critical care time when appropriate  Document review of labs and clinical decision tools ie CHADS2VASC2, etc  Document your independent review of radiology images and any outside records  Document your discussion with family members, caretakers and with consultants  Document social determinants of health affecting pt's care  Document your decision making why or why not admission, treatments were needed:32947:::1}   Final diagnoses:  None    ED Discharge Orders     None

## 2024-01-01 DIAGNOSIS — F411 Generalized anxiety disorder: Secondary | ICD-10-CM | POA: Diagnosis not present

## 2024-01-01 DIAGNOSIS — F331 Major depressive disorder, recurrent, moderate: Secondary | ICD-10-CM | POA: Diagnosis present

## 2024-01-01 DIAGNOSIS — E162 Hypoglycemia, unspecified: Secondary | ICD-10-CM | POA: Diagnosis not present

## 2024-01-01 DIAGNOSIS — F4310 Post-traumatic stress disorder, unspecified: Secondary | ICD-10-CM | POA: Diagnosis not present

## 2024-01-01 LAB — GLUCOSE, CAPILLARY
Glucose-Capillary: 100 mg/dL — ABNORMAL HIGH (ref 70–99)
Glucose-Capillary: 100 mg/dL — ABNORMAL HIGH (ref 70–99)
Glucose-Capillary: 103 mg/dL — ABNORMAL HIGH (ref 70–99)
Glucose-Capillary: 105 mg/dL — ABNORMAL HIGH (ref 70–99)
Glucose-Capillary: 107 mg/dL — ABNORMAL HIGH (ref 70–99)
Glucose-Capillary: 107 mg/dL — ABNORMAL HIGH (ref 70–99)
Glucose-Capillary: 115 mg/dL — ABNORMAL HIGH (ref 70–99)
Glucose-Capillary: 118 mg/dL — ABNORMAL HIGH (ref 70–99)
Glucose-Capillary: 146 mg/dL — ABNORMAL HIGH (ref 70–99)
Glucose-Capillary: 149 mg/dL — ABNORMAL HIGH (ref 70–99)
Glucose-Capillary: 159 mg/dL — ABNORMAL HIGH (ref 70–99)
Glucose-Capillary: 63 mg/dL — ABNORMAL LOW (ref 70–99)
Glucose-Capillary: 74 mg/dL (ref 70–99)
Glucose-Capillary: 77 mg/dL (ref 70–99)
Glucose-Capillary: 87 mg/dL (ref 70–99)
Glucose-Capillary: 89 mg/dL (ref 70–99)
Glucose-Capillary: 91 mg/dL (ref 70–99)

## 2024-01-01 LAB — COMPREHENSIVE METABOLIC PANEL WITH GFR
ALT: 33 U/L (ref 0–44)
AST: 18 U/L (ref 15–41)
Albumin: 4.1 g/dL (ref 3.5–5.0)
Alkaline Phosphatase: 80 U/L (ref 38–126)
Anion gap: 13 (ref 5–15)
BUN: 7 mg/dL (ref 6–20)
CO2: 26 mmol/L (ref 22–32)
Calcium: 9.2 mg/dL (ref 8.9–10.3)
Chloride: 106 mmol/L (ref 98–111)
Creatinine, Ser: 0.67 mg/dL (ref 0.44–1.00)
GFR, Estimated: >60 mL/min (ref >60)
Glucose, Bld: 116 mg/dL — ABNORMAL HIGH (ref 70–99)
Potassium: 3.5 mmol/L (ref 3.5–5.1)
Sodium: 144 mmol/L (ref 135–145)
Total Bilirubin: 0.6 mg/dL (ref 0.0–1.2)
Total Protein: 6.6 g/dL (ref 6.5–8.1)

## 2024-01-01 LAB — INSULIN, RANDOM: Insulin: 32.7 u[IU]/mL — ABNORMAL HIGH (ref 2.6–24.9)

## 2024-01-01 LAB — CBC
HCT: 40.7 % (ref 36.0–46.0)
Hemoglobin: 13.1 g/dL (ref 12.0–15.0)
MCH: 30.8 pg (ref 26.0–34.0)
MCHC: 32.2 g/dL (ref 30.0–36.0)
MCV: 95.5 fL (ref 80.0–100.0)
Platelets: 292 K/uL (ref 150–400)
RBC: 4.26 MIL/uL (ref 3.87–5.11)
RDW: 12.7 % (ref 11.5–15.5)
WBC: 12.8 K/uL — ABNORMAL HIGH (ref 4.0–10.5)
nRBC: 0 % (ref 0.0–0.2)

## 2024-01-01 LAB — HIV ANTIBODY (ROUTINE TESTING W REFLEX): HIV Screen 4th Generation wRfx: NONREACTIVE

## 2024-01-01 LAB — C-PEPTIDE: C-Peptide: 3.4 ng/mL (ref 1.1–4.4)

## 2024-01-01 MED ORDER — LACTATED RINGERS IV SOLN: INTRAVENOUS | Status: DC

## 2024-01-01 NOTE — Plan of Care (Signed)

## 2024-01-01 NOTE — Progress Notes (Signed)
 PROGRESS NOTE    Cynthia Frazier  FMW:[id number] DOB: 04-Aug-1990 DOA: 12/31/2023 PCP: Verdia Lombard, MD   Brief Narrative: 33 year old with past medical history significant for anxiety, dysphagia, GERD, obesity class III, asthma, onychomycosis, vitamin D deficiency who presented to the ED complaining of blurry vision, dizziness in the setting of hypoglycemia.  Patient reported having diarrhea for the last week and unable to eat and drink.  Reports emesis.  She report family sick contact.  Patient received amp of D50 in the ED x 3, Zofran , IV fluids.  GI pathogen was positive for Shiga toxin.   Assessment & Plan:   Principal Problem:   Hypoglycemia Active Problems:   Asthma   Obesity, Class III, BMI 40-49.9 (morbid obesity) (HCC)   Vitamin D deficiency   Anxiety   1-Hypoglycemia: - In the setting of poor oral intake, vomiting. - Proinsulin, insulin random 32 elevated,  C-peptide 3.4 -In setting of insulin used.  -plan to see how she does off D 10 IV fluids. Insulin used was more than 24 hours ago.  -Will asked psych to evaluated, patient under a lot of stress.   Gastroenteritis: -Presented with nausea vomiting diarrhea.  GI pathogen positive for Shiga like toxin on 10/29. -she reporter, nausea, vomiting and some diarrhea.  -improved.   Asthma -As needed nebulizers.  Obesity class III, BMI 43 - Encouraged lifestyle modification.  Vitamin D deficiency: - Continue with vitamin D supplementation.  Anxiety: - Continue with BuSpar and escitalopram.   Estimated body mass index is 43.35 kg/m as calculated from the following:   Height as of this encounter: 5' 2 (1.575 m).   Weight as of this encounter: 107.5 kg.   DVT prophylaxis: Lovenox Code Status: Full code Family Communication: Care discussed with patient.  Disposition Plan:  Status is: Observation The patient remains OBS appropriate and will d/c before 2 midnights.    Consultants:  Psych    Procedures:  none  Antimicrobials:    Subjective: She is feeling better, no further vomiting. No diarrhea.    Objective: Vitals:   01/01/24 0046 01/01/24 0100 01/01/24 0200 01/01/24 0300  BP:  136/71 (!) 103/50 (!) 136/90  Pulse:  70 71 71  Resp:  11 10   Temp: 98.2 F (36.8 C)     TempSrc: Oral     SpO2:  97% 97% 99%  Weight:      Height:        Intake/Output Summary (Last 24 hours) at 01/01/2024 0700 Last data filed at 01/01/2024 0336 Gross per 24 hour  Intake 5359.33 ml  Output --  Net 5359.33 ml   Filed Weights   12/31/23 0539 12/31/23 1244  Weight: 108.8 kg 107.5 kg    Examination:  General exam: Appears calm and comfortable  Respiratory system: Clear to auscultation. Respiratory effort normal. Cardiovascular system: S1 & S2 heard, RRR. No JVD, murmurs, rubs, gallops or clicks. No pedal edema. Gastrointestinal system: Abdomen is nondistended, soft and nontender. No organomegaly or masses felt. Normal bowel sounds heard. Central nervous system: Alert and oriented. No focal neurological deficits. Extremities: Symmetric 5 x 5 power.   Data Reviewed: I have personally reviewed following labs and imaging studies  CBC: Recent Labs  Lab 12/30/23 0109 12/31/23 0604 01/01/24 0307  WBC 8.3 11.3* 12.8*  NEUTROABS  --  8.3*  --   HGB 13.5 13.8 13.1  HCT 40.5 41.0 40.7  MCV 94.8 94.0 95.5  PLT 246 291 292   Basic Metabolic Panel: Recent Labs  Lab 12/30/23 0109 12/31/23 0604 01/01/24 0307  NA 141 142 144  K 4.2 4.1 3.5  CL 108 106 106  CO2 22 26 26   GLUCOSE 64* 54* 116*  BUN 14 <5* 7  CREATININE 0.78 0.75 0.67  CALCIUM 9.3 9.8 9.2   GFR: Estimated Creatinine Clearance: 113.3 mL/min (by C-G formula based on SCr of 0.67 mg/dL). Liver Function Tests: Recent Labs  Lab 12/30/23 0109 12/31/23 0604 01/01/24 0307  AST 28 24 18   ALT 37 43 33  ALKPHOS 78 90 80  BILITOT 0.4 0.5 0.6  PROT 7.0 7.5 6.6  ALBUMIN 4.3 4.6 4.1   Recent Labs  Lab  12/30/23 0400 12/31/23 0604  LIPASE 25 25   No results for input(s): AMMONIA in the last 168 hours. Coagulation Profile: No results for input(s): INR, PROTIME in the last 168 hours. Cardiac Enzymes: No results for input(s): CKTOTAL, CKMB, CKMBINDEX, TROPONINI in the last 168 hours. BNP (last 3 results) Recent Labs    12/15/23 2256  PROBNP 84.8   HbA1C: No results for input(s): HGBA1C in the last 72 hours. CBG: Recent Labs  Lab 01/01/24 0242 01/01/24 0355 01/01/24 0446 01/01/24 0557 01/01/24 0645  GLUCAP 149* 89 103* 74 107*   Lipid Profile: No results for input(s): CHOL, HDL, LDLCALC, TRIG, CHOLHDL, LDLDIRECT in the last 72 hours. Thyroid  Function Tests: No results for input(s): TSH, T4TOTAL, FREET4, T3FREE, THYROIDAB in the last 72 hours. Anemia Panel: No results for input(s): VITAMINB12, FOLATE, FERRITIN, TIBC, IRON, RETICCTPCT in the last 72 hours. Sepsis Labs: No results for input(s): PROCALCITON, LATICACIDVEN in the last 168 hours.  Recent Results (from the past 240 hours)  C Difficile Quick Screen w PCR reflex     Status: None   Collection Time: 12/30/23  4:00 AM   Specimen: STOOL  Result Value Ref Range Status   C Diff antigen NEGATIVE NEGATIVE Final   C Diff toxin NEGATIVE NEGATIVE Final   C Diff interpretation No C. difficile detected.  Final    Comment: Performed at [facility], [address] [place], [place] [address]  Gastrointestinal Panel by PCR , Stool     Status: Abnormal   Collection Time: 12/30/23  4:00 AM   Specimen: STOOL  Result Value Ref Range Status   Campylobacter species NOT DETECTED NOT DETECTED Final   Plesimonas shigelloides NOT DETECTED NOT DETECTED Final   Salmonella species NOT DETECTED NOT DETECTED Final   Yersinia enterocolitica NOT DETECTED NOT DETECTED Final   Vibrio species NOT DETECTED NOT DETECTED Final   Vibrio cholerae NOT DETECTED NOT DETECTED Final    Enteroaggregative E coli (EAEC) NOT DETECTED NOT DETECTED Final   Enterotoxigenic E coli (ETEC) NOT DETECTED NOT DETECTED Final   Shiga like toxin producing E coli (STEC) DETECTED (A) NOT DETECTED Final    Comment: RESULT CALLED TO, READ BACK BY AND VERIFIED WITH: CORNNOR LOUZER AT 1553 ON 12/30/23 BY SS    E. coli O157 NOT DETECTED NOT DETECTED Final   Shigella/Enteroinvasive E coli (EIEC) NOT DETECTED NOT DETECTED Final   Cryptosporidium NOT DETECTED NOT DETECTED Final   Cyclospora cayetanensis NOT DETECTED NOT DETECTED Final   Entamoeba histolytica NOT DETECTED NOT DETECTED Final   Giardia lamblia NOT DETECTED NOT DETECTED Final   Adenovirus F40/41 NOT DETECTED NOT DETECTED Final   Astrovirus NOT DETECTED NOT DETECTED Final   Norovirus GI/GII NOT DETECTED NOT DETECTED Final   Rotavirus A NOT DETECTED NOT DETECTED Final   Sapovirus (I,  II, IV, and V) NOT DETECTED NOT DETECTED Final    Comment: Performed at [facility], [address] [place], [place] [address]  MRSA Next Gen by PCR, Nasal     Status: None   Collection Time: 12/31/23  1:02 PM   Specimen: Nasal Mucosa; Nasal Swab  Result Value Ref Range Status   MRSA by PCR Next Gen NOT DETECTED NOT DETECTED Final    Comment: (NOTE) The GeneXpert MRSA Assay (FDA approved for NASAL specimens only), is one component of a comprehensive MRSA colonization surveillance program. It is not intended to diagnose MRSA infection nor to guide or monitor treatment for MRSA infections. Test performance is not FDA approved in patients less than 5 years old. Performed at [facility], [address], [place], [place] [address]          Radiology Studies: No results found.      Scheduled Meds:  acyclovir  400 mg Oral BID   busPIRone  5 mg Oral TID   Chlorhexidine Gluconate Cloth  6 each Topical Daily   enoxaparin (LOVENOX) injection  60 mg Subcutaneous Q24H   escitalopram  10 mg Oral Daily   loratadine   10 mg Oral Daily   QUEtiapine  25 mg Oral QHS   Continuous Infusions:  dextrose 75 mL/hr at 01/01/24 9392   lactated ringers 100 mL/hr at 01/01/24 0336     LOS: 0 days    Time spent: 35 minutes    Elazar Argabright A Kamiya Acord, MD [facility]   If 7PM-7AM, please contact night-coverage [email or web address]  01/01/2024, 7:00 AM

## 2024-01-01 NOTE — Consult Note (Signed)
 [facility] Psychiatric Consult Initial  Patient Name: .Cynthia Frazier  MRN: [id number]  DOB: 1990-05-06  Consult Order details:  Orders (From admission, onward)     Start     Ordered   01/01/24 0916  IP CONSULT TO PSYCHIATRY       Ordering Provider: Madelyne Owen LABOR, MD  Provider:  (Not yet assigned)  Question Answer Comment  Location [facility]   Reason for Consult? insulin overdose, a lot of life stressors.      01/01/24 0915             Mode of Visit: In person    Psychiatry Consult Evaluation  Service Date: January 01, 2024 LOS:  LOS: 0 days  Chief Complaint I was not trying to kill myself.  I was taking the insulin at the dose recommended for weight loss.  She wants to lose weight so she can be there for her children and family.  Initially, she looked into weight loss drugs like Wegovy but could not afford these and went to the internet to seek weight loss assistance which led to the insulin use.  Primary Psychiatric Diagnoses  GAD 2.   Major depressive disorder, recurrent, moderate 3.   PTSD  Assessment  Cynthia Frazier is a 33 y.o. female admitted: Medicallyfor 12/31/2023  5:34 AM with history of anxiety, dysphagia, GERD, class III obesity, asthma, onychomycosis, vitamin D deficiency who presented yesterday to the emergency department early morning yesterday with blurry vision and dizziness due to hypoglycemia.  She has been having diarrhea for the past week and has had trouble keeping up with her oral intake.  Her husband had similar symptoms, but those were self-limited.  She had 2 episodes of emesis in the emergency department on the second visit.  No constipation, melena or hematochezia. Diagnosed with E coli with treatment in place.  Her current presentation of feeling overwhelmed, distracted at times, excessive worry that is difficult to control is most consistent with GAD.   Current outpatient psychotropic medications include Lexapro,  Buspar, Klonopin, Prazosin , and Seroquel and historically she has had a partial response to these medications. She was  compliant with medications prior to admission as evidenced by self-report. On initial examination, patient patient was talkative with no issues communicating her complex family issues, see note below for more detail.SABRA Please see plan below for detailed recommendations.   Diagnoses:  Active Hospital problems: Principal Problem:   Hypoglycemia Active Problems:   Asthma   Obesity, Class III, BMI 40-49.9 (morbid obesity) (HCC)   Vitamin D deficiency   Anxiety   Major depressive disorder, recurrent episode, moderate (HCC)    Plan   ## Psychiatric Medication Recommendations:  -Continue Lexapro 10 mg daily -Continue Buspar 5 mg TID -Continue Klonopin 0.5 mg PRN panic attacks -Continue Seroquel 50 mg at bedtime  ## Medical Decision Making Capacity: Not specifically addressed in this encounter  ## Further Work-up:  -- most recent EKG on 10/29 had QtC of 450 -- Pertinent labwork reviewed earlier this admission includes: toxicology, infectious disease, CBC, chem panel, U/A, EKG   ## Disposition:-- to be determined  ## Behavioral / Environmental: - No specific recommendations at this time.     ## Safety and Observation Level:  - Based on my clinical evaluation, I estimate the patient to be at low risk of self harm in the current setting. - At this time, we recommend  routine. This decision is based on my review of the chart including patient's  history and current presentation, interview of the patient, mental status examination, and consideration of suicide risk including evaluating suicidal ideation, plan, intent, suicidal or self-harm behaviors, risk factors, and protective factors. This judgment is based on our ability to directly address suicide risk, implement suicide prevention strategies, and develop a safety plan while the patient is in the clinical setting. Please  contact our team if there is a concern that risk level has changed.  CSSR Risk Category:C-SSRS RISK CATEGORY: No Risk  Suicide Risk Assessment: Patient has following modifiable risk factors for suicide: triggering events of stress in the home which we are addressing by continuation of therapy. Patient has following non-modifiable or demographic risk factors for suicide: psychiatric hospitalization Patient has the following protective factors against suicide: Access to outpatient mental health care, Supportive family, Minor children in the home, and no history of suicide attempts  Thank you for this consult request. Recommendations have been communicated to the primary team.  We will continue to follow at this time.   Sharlot Becker, NP       History of Present Illness  Relevant Aspects of [facility] Course:  Admitted on 12/31/2023 for hypoglycemia, N/V and diagnosed with E coli.  Patient Report:  33 yo female admitted with E coli and hypoglycemia.  She was taking insulin for weight loss and stated, I was not trying to kill myself.  I was taking the insulin at the dose recommended for weight loss.  She wants to lose weight so she can be there for her children and family.  Initially, she looked into weight loss drugs like Wegovy but could not afford these and went to the internet to seek weight loss assistance which led to the insulin use.  Her husband is over 400 pounds and diagnosed with a heart mass.  He is in palliative care as the mass cannot be removed due to his weight.  She is also overweight and worried that if she did not lose weight, her children would lose her also.  This led to her weight loss treatment.  The client works at [employer] which is very supportive to her and her family.  When she mentions she is struggling with caring for her husband, they send food to assist.  Her 56 yo stepdaughter (female to female transgender) lives in the home and assists with the care of her  father along with their friend.  The client and spouse have an 37 yo son who is in the home.  She knows she has to be strong for the family and is in treatment at [facility].  Val also recognized the need for her children to receive therapy and has that in place.  She is adamant on assessment that she was not trying to end her life and has had no past suicide attempts.  When she gets overwhelmed, she seeks help.  In 2016, she went to her husband who assisted her in getting care with two admissions that year; none since this time.  Her current depression is moderate with no suicidal ideations with support from family outside of the immediate family.  No hallucinations, paranoia, homicidal ideations, or substance abuse.    She was appropriate during the assessment with no concerning emotional issues outside the appropriate level for a person in her situation.  Val is managing the stress well while trying to maintain her own health.  She did not hesitate to have this provider contact her step-daughter and is trying not to worry her  husband who is sick.    Val is in ICU and will be here a couple of day with psych revisiting.  She does not seem to need an inpatient hospitalization and will continue to monitor.  Psych ROS:  Depression: moderate Anxiety:  moderate to high Mania (lifetime and current): none Psychosis: (lifetime and current): none  Collateral information:  Contacted her 38 yo stepdaughter Mateo) on 01/01/2024 who had no safety concerns and confirmed no guns in the home.  She stated, everyone is more stressed with my father's cancer and decline in health.  Review of Systems  Constitutional: Negative.   HENT: Negative.    Eyes: Negative.   Respiratory: Negative.    Cardiovascular: Negative.   Gastrointestinal:  Positive for abdominal pain and nausea.  Genitourinary: Negative.   Musculoskeletal: Negative.   Skin: Negative.   Neurological: Negative.   Endo/Heme/Allergies: Negative.    Psychiatric/Behavioral:  Positive for depression. The patient is nervous/anxious.      Psychiatric and Social History  Psychiatric History:  Information collected from patient, stepdaughter, and chart  Prev Dx/Sx: MDD, GAD, PTSD Current Psych Provider: [facility] (current): Prazosin , Seroquel, Buspar, Klonopin, and Lexapro Therapy: in place  Prior Psych Hospitalization: two in 2016 after the birth of her son  Prior Self Harm: cutting in middle school, none since then Prior Violence: none  Social History:  Occupational Hx: works for [employer] Hx: none Living Situation: lives with husband, 28 yo step daughter, and 88 yo son  Access to weapons/lethal means: none   Substance History Occasional use of cannabis, none recently  Exam Findings  Physical Exam: completed by the MD, reviewed. Vital Signs:  Temp:  [98.2 F (36.8 C)-99.2 F (37.3 C)] 98.4 F (36.9 C) (10/31 0800) Pulse Rate:  [56-82] 66 (10/31 1056) Resp:  [9-26] 9 (10/31 1054) BP: (103-157)/(50-96) 154/78 (10/31 1042) SpO2:  [96 %-100 %] 99 % (10/31 1056) Weight:  [107.5 kg] 107.5 kg (10/30 1244) Blood pressure (!) 154/78, pulse 66, temperature 98.4 F (36.9 C), temperature source Oral, resp. rate (!) 9, height 5' 2 (1.575 m), weight 107.5 kg, SpO2 99%. Body mass index is 43.35 kg/m.  Physical Exam Vitals and nursing note reviewed.  Constitutional:      Appearance: Normal appearance.  HENT:     Head: Normocephalic.     Nose: Nose normal.  Pulmonary:     Effort: Pulmonary effort is normal.  Musculoskeletal:     Cervical back: Normal range of motion.  Neurological:     Mental Status: She is alert.     Mental Status Exam: General Appearance: Casual  Orientation:  Full (Time, Place, and Person)  Memory:  Immediate;   Good Recent;   Good Remote;   Good  Concentration:  Concentration: Good and Attention Span: Good  Recall:  Good  Attention  Good  Eye Contact:  Good  Speech:  Normal  Rate  Language:  Good  Volume:  Normal  Mood: depressed, anxious  Affect:  Congruent  Thought Process:  Coherent  Thought Content:  Logical  Suicidal Thoughts:  No  Homicidal Thoughts:  No  Judgement:  Fair  Insight:  Good  Psychomotor Activity:  Decreased  Akathisia:  No  Fund of Knowledge:  Good      Assets:  Housing Leisure Time Physical Health Resilience Social Support  Cognition:  WNL  ADL's:  Intact  AIMS (if indicated):        Other History   These have been  pulled in through the EMR, reviewed, and updated if appropriate.  Family History:  The patient's family history is not on file. She was adopted.  Medical History: Past Medical History:  Diagnosis Date  . Anxiety   . Depression   . Dysphagia   . Left upper quadrant abdominal pain 07/11/2020  . Nausea and vomiting 07/11/2020  . Throat pain   . Vitamin D deficiency     Surgical History: Past Surgical History:  Procedure Laterality Date  . CESAREAN SECTION    . CHOLECYSTECTOMY       Medications:   Current Facility-Administered Medications:  .  acetaminophen (TYLENOL) tablet 650 mg, 650 mg, Oral, Q6H PRN **OR** acetaminophen (TYLENOL) suppository 650 mg, 650 mg, Rectal, Q6H PRN, Celinda Alm Lot, MD .  acyclovir (ZOVIRAX) tablet 400 mg, 400 mg, Oral, BID, Celinda Alm Lot, MD, 400 mg at 01/01/24 0841 .  busPIRone (BUSPAR) tablet 5 mg, 5 mg, Oral, TID, Celinda Alm Lot, MD, 5 mg at 01/01/24 0841 .  Chlorhexidine Gluconate Cloth 2 % PADS 6 each, 6 each, Topical, Daily, Celinda Alm Lot, MD, 6 each at 12/31/23 1318 .  clonazePAM (KLONOPIN) tablet 0.5 mg, 0.5 mg, Oral, Daily PRN, Celinda Alm Lot, MD .  dextrose 50 % solution 25 g, 25 g, Intravenous, PRN, Celinda Alm Lot, MD .  enoxaparin (LOVENOX) injection 60 mg, 60 mg, Subcutaneous, Q24H, Celinda Alm Lot, MD, 60 mg at 12/31/23 2232 .  escitalopram (LEXAPRO) tablet 10 mg, 10 mg, Oral, Daily, Celinda Alm Lot, MD, 10 mg at  01/01/24 0841 .  lactated ringers infusion, , Intravenous, Continuous, Regalado, Belkys A, MD, Last Rate: 75 mL/hr at 01/01/24 1000, Infusion Verify at 01/01/24 1000 .  loratadine (CLARITIN) tablet 10 mg, 10 mg, Oral, Daily, Celinda Alm Lot, MD, 10 mg at 01/01/24 0840 .  morphine (PF) 4 MG/ML injection 4 mg, 4 mg, Intravenous, Q3H PRN, Celinda Alm Lot, MD .  ondansetron  (ZOFRAN ) tablet 4 mg, 4 mg, Oral, Q6H PRN **OR** ondansetron  (ZOFRAN ) injection 4 mg, 4 mg, Intravenous, Q6H PRN, Celinda Alm Lot, MD .  Oral care mouth rinse, 15 mL, Mouth Rinse, PRN, Celinda Alm Lot, MD .  pantoprazole (PROTONIX) EC tablet 40 mg, 40 mg, Oral, Daily PRN, Celinda Alm Lot, MD .  prazosin  (MINIPRESS ) capsule 5 mg, 5 mg, Oral, QHS PRN, Celinda Alm Lot, MD .  QUEtiapine (SEROQUEL) tablet 25 mg, 25 mg, Oral, QHS, Celinda Alm Lot, MD  Allergies: Allergies  Allergen Reactions  . Latex Anaphylaxis, Hives and Dermatitis  . Sumatriptan Anaphylaxis and Hives  . Vancomycin Itching, Swelling and Other (See Comments)    Made the mouth itch and lips swell  . Other Other (See Comments)    Patient has Eosinophilic esophagitis, so she normally AVOIDS wheat, eggs, and dairy.  . Avocado Rash  . Banana Rash  . Kiwi Extract Rash  . Levofloxacin Rash and Hypertension    Sharlot Becker, NP

## 2024-01-01 NOTE — Progress Notes (Signed)
 Pt reports that she took 300 units of her husbands lantus on 10/28 PM in a weight loss attempt. Pt denies any suicidal ideations, pt states she does not want to die & that it was not an attempt to end her life.  MD notified.

## 2024-01-02 DIAGNOSIS — F411 Generalized anxiety disorder: Secondary | ICD-10-CM

## 2024-01-02 DIAGNOSIS — F331 Major depressive disorder, recurrent, moderate: Secondary | ICD-10-CM

## 2024-01-02 DIAGNOSIS — E162 Hypoglycemia, unspecified: Secondary | ICD-10-CM | POA: Diagnosis not present

## 2024-01-02 DIAGNOSIS — F4310 Post-traumatic stress disorder, unspecified: Secondary | ICD-10-CM | POA: Diagnosis not present

## 2024-01-02 LAB — GLUCOSE, CAPILLARY
Glucose-Capillary: 81 mg/dL (ref 70–99)
Glucose-Capillary: 74 mg/dL (ref 70–99)
Glucose-Capillary: 119 mg/dL — ABNORMAL HIGH (ref 70–99)

## 2024-01-02 MED ORDER — METHYLPREDNISOLONE SODIUM SUCC 40 MG IJ SOLR
40.0000 mg | Freq: Once | INTRAMUSCULAR | Status: AC
  Administered 2024-01-02: 40 mg via INTRAVENOUS
  Filled 2024-01-02: qty 1

## 2024-01-02 NOTE — Discharge Summary (Signed)
 Physician Discharge Summary   Patient: Cynthia Frazier MRN: [id number] DOB: December 14, 1990  Admit date:     12/31/2023  Discharge date: 01/02/24  Discharge Physician: Owen DELENA Lore   PCP: Verdia Lombard, MD   Recommendations at discharge:    Follow up with psych for further support.  Follow up with PCP for Chronic medical problems.   Discharge Diagnoses: Principal Problem:   Hypoglycemia Active Problems:   Asthma   Obesity, Class III, BMI 40-49.9 (morbid obesity) (HCC)   Vitamin D deficiency   Anxiety   Major depressive disorder, recurrent episode, moderate (HCC)  Resolved Problems:   * No resolved hospital problems. *  Hospital Course: 33 year old with past medical history significant for anxiety, dysphagia, GERD, obesity class III, asthma, onychomycosis, vitamin D deficiency who presented to the ED complaining of blurry vision, dizziness in the setting of hypoglycemia.  Patient reported having diarrhea for the last week and unable to eat and drink.  Reports emesis.  She report family sick contact.   Patient received amp of D50 in the ED x 3, Zofran , IV fluids.  GI pathogen was positive for Shiga toxin.  Assessment and Plan: 1-Hypoglycemia: Patient took 300 units of lantus  to try to loose weight - Proinsulin, insulin random 32 elevated,  C-peptide 3.4 -In setting of insulin used.  CBG normal range off D 10//  Will give solumedrol prior to discharge.  Stable for discharge. Discussed with patient symptoms of hypoglycemia.  Clear by psych for discharge as well.    Gastroenteritis: -Presented with nausea vomiting diarrhea.  GI pathogen positive for Shiga like toxin on 10/29. -she reporter, nausea, vomiting and some diarrhea.  -improved.    Asthma -As needed nebulizers.   Obesity class III, BMI 43 - Encouraged lifestyle modification.   Vitamin D deficiency: - Continue with vitamin D supplementation.   Anxiety: - Continue with BuSpar and escitalopram.       Consultants: Psych  Procedures performed: none Disposition: Home Diet recommendation:  Discharge Diet Orders (From admission, onward)     Start     Ordered   01/02/24 0000  Diet general        01/02/24 1012           Regular diet DISCHARGE MEDICATION: Allergies as of 01/02/2024       Reactions   Latex Anaphylaxis, Hives, Dermatitis   Sumatriptan Anaphylaxis, Hives   Vancomycin Itching, Swelling, Other (See Comments)   Made the mouth itch and lips swell   Other Other (See Comments)   Patient has Eosinophilic esophagitis, so she normally AVOIDS wheat, eggs, and dairy.   Avocado Rash   Banana Rash   Kiwi Extract Rash   Levofloxacin Rash, Hypertension        Medication List     STOP taking these medications    ciclopirox  8 % solution Commonly known as: Penlac    fluconazole  150 MG tablet Commonly known as: Diflucan    lamoTRIgine  25 MG tablet Commonly known as: LAMICTAL    loperamide  2 MG capsule Commonly known as: IMODIUM    predniSONE  50 MG tablet Commonly known as: DELTASONE    Ryaltris  665-25 MCG/ACT Susp Generic drug: Olopatadine -Mometasone    sertraline  100 MG tablet Commonly known as: ZOLOFT        TAKE these medications    acyclovir 800 MG tablet Commonly known as: ZOVIRAX Take 400 mg by mouth in the morning and at bedtime.   Advair Diskus 250-50 MCG/ACT Aepb Generic drug: fluticasone -salmeterol 1 puff See admin instructions. Inhale 1 puff  into the mouth and swallow for Eosinophilic esophagitis one to two times a day   Advil  200 MG Caps Generic drug: Ibuprofen  Take 800 mg by mouth 2 (two) times daily as needed (for pain or headaches).   busPIRone 5 MG tablet Commonly known as: BUSPAR Take 5 mg by mouth 3 (three) times daily.   clonazePAM 0.5 MG tablet Commonly known as: KLONOPIN Take 0.5 mg by mouth daily as needed for anxiety (or sleep).   escitalopram 10 MG tablet Commonly known as: LEXAPRO Take 10 mg by mouth daily.    estradiol 0.01 % Crea vaginal cream Commonly known as: ESTRACE Place vaginally See admin instructions. Apply to affected (vaginal) area up to three times a week   Excedrin Extra Strength 250-250-65 MG tablet Generic drug: aspirin-acetaminophen-caffeine Take 1-2 tablets by mouth every 6 (six) hours as needed for headache or migraine (or pain).   fexofenadine  180 MG tablet Commonly known as: ALLEGRA  Take 180 mg by mouth in the morning.   fluticasone  50 MCG/ACT nasal spray Commonly known as: FLONASE  Place 2 sprays into both nostrils daily. What changed:  when to take this reasons to take this   montelukast  10 MG tablet Commonly known as: SINGULAIR  TAKE 1 TABLET BY MOUTH AT BEDTIME   naproxen  500 MG tablet Commonly known as: NAPROSYN  Take 1 tablet (500 mg total) by mouth 2 (two) times daily with a meal. What changed:  when to take this reasons to take this   Olopatadine  HCl 0.2 % Soln Apply 1 drop to eye 2 (two) times daily. What changed:  how to take this when to take this reasons to take this   ondansetron  4 MG disintegrating tablet Commonly known as: ZOFRAN -ODT Take 1 tablet (4 mg total) by mouth every 8 (eight) hours as needed. What changed: reasons to take this   One-A-Day Womens Formula Tabs Take 1 tablet by mouth daily with breakfast.   pantoprazole 40 MG tablet Commonly known as: PROTONIX Take 40 mg by mouth daily as needed (for reflux symptoms- in the morning/30 minutes before breakfast).   prazosin  5 MG capsule Commonly known as: MINIPRESS  Take 5 mg by mouth at bedtime as needed (for nightmares).   promethazine  25 MG tablet Commonly known as: PHENERGAN  Take 1 tablet (25 mg total) by mouth every 6 (six) hours as needed for nausea or vomiting.   QUEtiapine 50 MG tablet Commonly known as: SEROQUEL Take 25 mg by mouth at bedtime.        Discharge Exam: Filed Weights   12/31/23 0539 12/31/23 1244  Weight: 108.8 kg 107.5 kg  General;  NAD  Condition at discharge: stable  The results of significant diagnostics from this hospitalization (including imaging, microbiology, ancillary and laboratory) are listed below for reference.   Imaging Studies: CT ABDOMEN PELVIS W CONTRAST Result Date: 12/30/2023 EXAM: CT ABDOMEN AND PELVIS WITH CONTRAST 12/30/2023 05:01:42 AM TECHNIQUE: CT of the abdomen and pelvis was performed with the administration of intravenous contrast, 100mL of iohexol  (Omnipaque ) 300 mg/mL solution. Multiplanar reformatted images are provided for review. Automated exposure control, iterative reconstruction, and/or weight-based adjustment of the mA/kV was utilized to reduce the radiation dose to as low as reasonably achievable. COMPARISON: Comparison to CT dated 12/16/2023. CLINICAL HISTORY: Abdominal pain, acute, nonlocalized. FINDINGS: LOWER CHEST: No acute abnormality. LIVER: The liver is unremarkable. GALLBLADDER AND BILE DUCTS: Status post cholecystectomy. No biliary ductal dilatation. SPLEEN: No acute abnormality. PANCREAS: No acute abnormality. ADRENAL GLANDS: No acute abnormality. KIDNEYS, URETERS AND BLADDER: No  stones in the kidneys or ureters. No hydronephrosis. No perinephric or periureteral stranding. Urinary bladder is unremarkable. An intrauterine device (IUD) is seen in the appropriate position. GI AND BOWEL: Appendix is normal. Stomach demonstrates no acute abnormality. There is no bowel obstruction. PERITONEUM AND RETROPERITONEUM: No ascites. No free air. Clips from prior bilateral tubal ligation are noted in the pelvic cul-de-sac. VASCULATURE: Aorta is normal in caliber. LYMPH NODES: No lymphadenopathy. REPRODUCTIVE ORGANS: No acute abnormality. BONES AND SOFT TISSUES: No acute osseous abnormality. No focal soft tissue abnormality. IMPRESSION: 1. No acute findings or other significant abnormality. Electronically signed by: Norleen Kil MD 12/30/2023 05:13 AM EDT RP Workstation: [id number]   CT Angio Chest PE W  and/or Wo Contrast Result Date: 12/16/2023 EXAM: CTA CHEST PE WITH CONTRAST CT ABDOMEN AND PELVIS WITH CONTRAST 12/16/2023 12:45:47 AM TECHNIQUE: CTA of the chest was performed after the administration of 100 mL iohexol  (OMNIPAQUE ) 350 MG/ML injection. Multiplanar reformatted images are provided for review. MIP images are provided for review. CT of the abdomen and pelvis was performed with the administration of 100 mL iohexol  (OMNIPAQUE ) 350 MG/ML injection. Automated exposure control, iterative reconstruction, and/or weight based adjustment of the mA/kV was utilized to reduce the radiation dose to as low as reasonably achievable. COMPARISON: CT abdomen/pelvis dated 08/17/2023. CLINICAL HISTORY: Pulmonary embolism (PE) suspected, high prob. Pt POV reporting bilateral flank pain, worse on R x3 days and no urine output x8hrs, endoscopy this AM. FINDINGS: CHEST: PULMONARY ARTERIES: Pulmonary arteries are adequately opacified for evaluation. No intraluminal filling defect to suggest pulmonary embolism. Main pulmonary artery is normal in caliber. MEDIASTINUM: No mediastinal lymphadenopathy. The heart and pericardium demonstrate no acute abnormality. There is no acute abnormality of the thoracic aorta. LUNGS AND PLEURA: The lungs are without acute process. No focal consolidation or pulmonary edema. No pleural effusion or pneumothorax. SOFT TISSUES AND BONES: No acute bone or soft tissue abnormality. ABDOMEN AND PELVIS: LIVER: The liver is unremarkable. GALLBLADDER AND BILE DUCTS: Status post cholecystectomy. No biliary ductal dilatation. SPLEEN: Spleen demonstrates no acute abnormality. PANCREAS: Pancreas demonstrates no acute abnormality. ADRENAL GLANDS: Adrenal glands demonstrate no acute abnormality. KIDNEYS, URETERS AND BLADDER: No stones in the kidneys or ureters. No hydronephrosis. No perinephric or periureteral stranding. Urinary bladder is unremarkable. GI AND BOWEL: Tiny hiatal hernia. Stomach and duodenal  sweep demonstrate no acute abnormality. Normal appendix (image 54). There is no bowel obstruction. No abnormal bowel wall thickening or distension. REPRODUCTIVE: Uterine IUD in satisfactory position. Bilateral ovaries are within normal limits. PERITONEUM AND RETROPERITONEUM: Trace pelvic ascites, likely physiologic. No free air. LYMPH NODES: No lymphadenopathy. BONES AND SOFT TISSUES: No acute abnormality of the visualized bones. No focal soft tissue abnormality. IMPRESSION: 1. No pulmonary embolism. 2. No acute findings in the chest, abdomen, or pelvis. Electronically signed by: Pinkie Pebbles MD 12/16/2023 12:53 AM EDT RP Workstation: [id number]   CT ABDOMEN PELVIS W CONTRAST Result Date: 12/16/2023 EXAM: CTA CHEST PE WITH CONTRAST CT ABDOMEN AND PELVIS WITH CONTRAST 12/16/2023 12:45:47 AM TECHNIQUE: CTA of the chest was performed after the administration of 100 mL iohexol  (OMNIPAQUE ) 350 MG/ML injection. Multiplanar reformatted images are provided for review. MIP images are provided for review. CT of the abdomen and pelvis was performed with the administration of 100 mL iohexol  (OMNIPAQUE ) 350 MG/ML injection. Automated exposure control, iterative reconstruction, and/or weight based adjustment of the mA/kV was utilized to reduce the radiation dose to as low as reasonably achievable. COMPARISON: CT abdomen/pelvis dated 08/17/2023. CLINICAL HISTORY: Pulmonary embolism (PE)  suspected, high prob. Pt POV reporting bilateral flank pain, worse on R x3 days and no urine output x8hrs, endoscopy this AM. FINDINGS: CHEST: PULMONARY ARTERIES: Pulmonary arteries are adequately opacified for evaluation. No intraluminal filling defect to suggest pulmonary embolism. Main pulmonary artery is normal in caliber. MEDIASTINUM: No mediastinal lymphadenopathy. The heart and pericardium demonstrate no acute abnormality. There is no acute abnormality of the thoracic aorta. LUNGS AND PLEURA: The lungs are without acute process. No  focal consolidation or pulmonary edema. No pleural effusion or pneumothorax. SOFT TISSUES AND BONES: No acute bone or soft tissue abnormality. ABDOMEN AND PELVIS: LIVER: The liver is unremarkable. GALLBLADDER AND BILE DUCTS: Status post cholecystectomy. No biliary ductal dilatation. SPLEEN: Spleen demonstrates no acute abnormality. PANCREAS: Pancreas demonstrates no acute abnormality. ADRENAL GLANDS: Adrenal glands demonstrate no acute abnormality. KIDNEYS, URETERS AND BLADDER: No stones in the kidneys or ureters. No hydronephrosis. No perinephric or periureteral stranding. Urinary bladder is unremarkable. GI AND BOWEL: Tiny hiatal hernia. Stomach and duodenal sweep demonstrate no acute abnormality. Normal appendix (image 54). There is no bowel obstruction. No abnormal bowel wall thickening or distension. REPRODUCTIVE: Uterine IUD in satisfactory position. Bilateral ovaries are within normal limits. PERITONEUM AND RETROPERITONEUM: Trace pelvic ascites, likely physiologic. No free air. LYMPH NODES: No lymphadenopathy. BONES AND SOFT TISSUES: No acute abnormality of the visualized bones. No focal soft tissue abnormality. IMPRESSION: 1. No pulmonary embolism. 2. No acute findings in the chest, abdomen, or pelvis. Electronically signed by: Pinkie Pebbles MD 12/16/2023 12:53 AM EDT RP Workstation: [id number]    Microbiology: Results for orders placed or performed during the hospital encounter of 12/31/23  MRSA Next Gen by PCR, Nasal     Status: None   Collection Time: 12/31/23  1:02 PM   Specimen: Nasal Mucosa; Nasal Swab  Result Value Ref Range Status   MRSA by PCR Next Gen NOT DETECTED NOT DETECTED Final    Comment: (NOTE) The GeneXpert MRSA Assay (FDA approved for NASAL specimens only), is one component of a comprehensive MRSA colonization surveillance program. It is not intended to diagnose MRSA infection nor to guide or monitor treatment for MRSA infections. Test performance is not FDA approved in  patients less than 59 years old. Performed at [facility], [address], [place], [place] [address]     Labs: CBC: Recent Labs  Lab 12/30/23 0109 12/31/23 0604 01/01/24 0307  WBC 8.3 11.3* 12.8*  NEUTROABS  --  8.3*  --   HGB 13.5 13.8 13.1  HCT 40.5 41.0 40.7  MCV 94.8 94.0 95.5  PLT 246 291 292   Basic Metabolic Panel: Recent Labs  Lab 12/30/23 0109 12/31/23 0604 01/01/24 0307  NA 141 142 144  K 4.2 4.1 3.5  CL 108 106 106  CO2 22 26 26   GLUCOSE 64* 54* 116*  BUN 14 <5* 7  CREATININE 0.78 0.75 0.67  CALCIUM 9.3 9.8 9.2   Liver Function Tests: Recent Labs  Lab 12/30/23 0109 12/31/23 0604 01/01/24 0307  AST 28 24 18   ALT 37 43 33  ALKPHOS 78 90 80  BILITOT 0.4 0.5 0.6  PROT 7.0 7.5 6.6  ALBUMIN 4.3 4.6 4.1   CBG: Recent Labs  Lab 01/01/24 2039 01/01/24 2337 01/02/24 0257 01/02/24 0559 01/02/24 0855  GLUCAP 118* 100* 81 74 119*    Discharge time spent: greater than 30 minutes.  Signed: Owen DELENA Lore, MD [facility] 01/02/2024

## 2024-01-02 NOTE — Progress Notes (Signed)
   01/02/24 1011  TOC Brief Assessment  Insurance and Status Reviewed (AETNA / AETNA [employer] QHP)  Patient has primary care physician Yes Annamae, Ajith, MD)  Home environment has been reviewed Single famiy home  Prior level of function: Independent with ADL's  Prior/Current Home Services No current home services  Social Drivers of Health Review SDOH reviewed interventions complete;SDOH reviewed needs interventions  Readmission risk has been reviewed Yes  Transition of care needs no transition of care needs at this time   Pt screened. SDOH needs identified, resources added to AVS. No further ICM needs at this time.

## 2024-01-02 NOTE — Discharge Instructions (Signed)
 FOOD PANTRY [employer] [address] [phone]  [facility] [employer] [address] [place] [phone]  [facility] Distribution [facility] [address] [place] [phone]  [facility] [address] [phone]  [facility] - Food Distribution Center [address] [place], [place] [address] [phone]  UTILITIES [facility] [address] [place] [phone] Rental assistance/rental hotline: [phone] ext. [phone].    Utility assistance/utility hotline: [phone] ext. [address] Department of IT consultant (heating/cooling and water assistance) [phone] (rental and utility assistance) [phone]  [employer] - call 211

## 2024-01-02 NOTE — Progress Notes (Signed)
 Discharge orders received after pt cleared by psychiatry. Discharge instructions provided and discussed with patient. All questions answered, IV removed, pt ordered personal uber, taken to entrance via wheelchair with NT and RN.

## 2024-01-02 NOTE — Consult Note (Signed)
 [facility] Psychiatric Consult Follow Up  Patient Name: .Cynthia Frazier  MRN: [id number]  DOB: 31-Jul-1990  Consult Order details:  Orders (From admission, onward)     Start     Ordered   01/01/24 0916  IP CONSULT TO PSYCHIATRY       Ordering Provider: Madelyne Owen LABOR, MD  Provider:  (Not yet assigned)  Question Answer Comment  Location [facility]   Reason for Consult? insulin overdose, a lot of life stressors.      01/01/24 0915             Mode of Visit: In person    Psychiatry Consult Evaluation  Service Date: January 02, 2024 LOS:  LOS: 1 day  Chief Complaint I was not trying to kill myself.  I was taking the insulin at the dose recommended for weight loss.  She wants to lose weight so she can be there for her children and family.  Initially, she looked into weight loss drugs like Wegovy but could not afford these and went to the internet to seek weight loss assistance which led to the insulin use.  Primary Psychiatric Diagnoses  GAD 2.   Major depressive disorder, recurrent, moderate 3.   PTSD  Assessment  Cynthia Frazier is a 33 y.o. female admitted: Medicallyfor 12/31/2023  5:34 AM with history of anxiety, dysphagia, GERD, class III obesity, asthma, onychomycosis, vitamin D deficiency who presented yesterday to the emergency department early morning yesterday with blurry vision and dizziness due to hypoglycemia.  She has been having diarrhea for the past week and has had trouble keeping up with her oral intake.  Her husband had similar symptoms, but those were self-limited.  She had 2 episodes of emesis in the emergency department on the second visit.  No constipation, melena or hematochezia. Diagnosed with E coli with treatment in place.  Her current presentation of feeling overwhelmed, distracted at times, excessive worry that is difficult to control is most consistent with GAD.   Current outpatient psychotropic medications include Lexapro,  Buspar, Klonopin, Prazosin , and Seroquel and historically she has had a partial response to these medications. She was  compliant with medications prior to admission as evidenced by self-report. On initial examination, patient patient was talkative with no issues communicating her complex family issues, see note below for more detail.SABRA Please see plan below for detailed recommendations.   Diagnoses:  Active Hospital problems: Principal Problem:   Hypoglycemia Active Problems:   Asthma   Obesity, Class III, BMI 40-49.9 (morbid obesity) (HCC)   Vitamin D deficiency   Anxiety   Major depressive disorder, recurrent episode, moderate (HCC)    Plan   ## Psychiatric Medication Recommendations:  -Continue Lexapro 10 mg daily -Continue Buspar 5 mg TID -Continue Klonopin 0.5 mg PRN panic attacks -Continue Seroquel 50 mg at bedtime  ## Medical Decision Making Capacity: Not specifically addressed in this encounter  ## Further Work-up:  -- most recent EKG on 10/29 had QtC of 450 -- Pertinent labwork reviewed earlier this admission includes: toxicology, infectious disease, CBC, chem panel, U/A, EKG   ## Disposition:-- to be determined  ## Behavioral / Environmental: - No specific recommendations at this time.     ## Safety and Observation Level:  - Based on my clinical evaluation, I estimate the patient to be at low risk of self harm in the current setting. - At this time, we recommend  routine. This decision is based on my review of the chart including  patient's history and current presentation, interview of the patient, mental status examination, and consideration of suicide risk including evaluating suicidal ideation, plan, intent, suicidal or self-harm behaviors, risk factors, and protective factors. This judgment is based on our ability to directly address suicide risk, implement suicide prevention strategies, and develop a safety plan while the patient is in the clinical setting. Please  contact our team if there is a concern that risk level has changed.  CSSR Risk Category:C-SSRS RISK CATEGORY: No Risk  Suicide Risk Assessment: Patient has following modifiable risk factors for suicide: triggering events of stress in the home which we are addressing by continuation of therapy. Patient has following non-modifiable or demographic risk factors for suicide: psychiatric hospitalization Patient has the following protective factors against suicide: Access to outpatient mental health care, Supportive family, Minor children in the home, and no history of suicide attempts  Thank you for this consult request. Recommendations have been communicated to the primary team.  We will sign off at this time.   Sharlot Becker, NP       History of Present Illness  Relevant Aspects of [facility] Course:  Admitted on 12/31/2023 for hypoglycemia, N/V and diagnosed with E coli.  Patient Report:  33 yo female admitted with E coli and hypoglycemia.  She was taking insulin for weight loss and stated, I was not trying to kill myself.  I was taking the insulin at the dose recommended for weight loss.  She wants to lose weight so she can be there for her children and family.  Initially, she looked into weight loss drugs like Wegovy but could not afford these and went to the internet to seek weight loss assistance which led to the insulin use.  Her husband is over 400 pounds and diagnosed with a heart mass.  He is in palliative care as the mass cannot be removed due to his weight.  She is also overweight and worried that if she did not lose weight, her children would lose her also.  This led to her weight loss treatment.  The client works at [employer] which is very supportive to her and her family.  When she mentions she is struggling with caring for her husband, they send food to assist.  Her 55 yo stepdaughter (female to female transgender) lives in the home and assists with the care of her father  along with their friend.  The client and spouse have an 71 yo son who is in the home.  She knows she has to be strong for the family and is in treatment at [facility].  Val also recognized the need for her children to receive therapy and has that in place.  She is adamant on assessment that she was not trying to end her life and has had no past suicide attempts.  When she gets overwhelmed, she seeks help.  In 2016, she went to her husband who assisted her in getting care with two admissions that year; none since this time.  Her current depression is moderate with no suicidal ideations with support from family outside of the immediate family.  No hallucinations, paranoia, homicidal ideations, or substance abuse.    She was appropriate during the assessment with no concerning emotional issues outside the appropriate level for a person in her situation.  Val is managing the stress well while trying to maintain her own health.  She did not hesitate to have this provider contact her step-daughter and is trying not to worry her  husband who is sick.    Val is in ICU and will be here a couple of day with psych revisiting.  She does not seem to need an inpatient hospitalization and will continue to monitor.  01/02/2024: The client is medically cleared and desires to discharge.  Reviewed the case with the psychiatrist with an emphasis on her not returning to use insulin.  This provider met with her and her affect was bright and reported feeling much better.  When discussing the insulin use, she stated I am absolutely not going to use it.  She also talked to her husband, best friend, and messaged her therapist about the insulin issue with plans to discuss with her psychiatrist on Monday.  I have a great support system.  Psych services in place and request for an emergency mental health number if needed in the future to add to the refrigerator with 911 for her husband due to an 23 yo being in the house, in case he needs to  call if there is an emergency.  The 988 number was given for mental health crises.    Psych ROS:  Depression: moderate Anxiety:  moderate to high Mania (lifetime and current): none Psychosis: (lifetime and current): none  Collateral information:  Contacted her 74 yo stepdaughter Mateo) on 01/01/2024 who had no safety concerns and confirmed no guns in the home.  She stated, everyone is more stressed with my father's cancer and decline in health.  Review of Systems  Constitutional: Negative.   HENT: Negative.    Eyes: Negative.   Respiratory: Negative.    Cardiovascular: Negative.   Gastrointestinal: Negative.   Genitourinary: Negative.   Musculoskeletal: Negative.   Skin: Negative.   Neurological: Negative.   Endo/Heme/Allergies: Negative.   Psychiatric/Behavioral:  Positive for depression. The patient is nervous/anxious.      Psychiatric and Social History  Psychiatric History:  Information collected from patient, stepdaughter, and chart  Prev Dx/Sx: MDD, GAD, PTSD Current Psych Provider: [facility] (current): Prazosin , Seroquel, Buspar, Klonopin, and Lexapro Therapy: in place  Prior Psych Hospitalization: two in 2016 after the birth of her son  Prior Self Harm: cutting in middle school, none since then Prior Violence: none  Social History:  Occupational Hx: works for [employer] Hx: none Living Situation: lives with husband, 73 yo step daughter, and 46 yo son  Access to weapons/lethal means: none   Substance History Occasional use of cannabis, none recently  Exam Findings  Physical Exam: completed by the MD, reviewed. Vital Signs:  Temp:  [98 F (36.7 C)-98.8 F (37.1 C)] 98.4 F (36.9 C) (11/01 0858) Pulse Rate:  [56-115] 62 (11/01 0700) Resp:  [8-24] 13 (11/01 0700) BP: (99-163)/(50-97) 163/89 (11/01 0700) SpO2:  [96 %-100 %] 97 % (11/01 0700) Blood pressure (!) 163/89, pulse 62, temperature 98.4 F (36.9 C), temperature source Oral,  resp. rate 13, height 5' 2 (1.575 m), weight 107.5 kg, SpO2 97%. Body mass index is 43.35 kg/m.  Physical Exam Vitals and nursing note reviewed.  Constitutional:      Appearance: Normal appearance.  HENT:     Head: Normocephalic.     Nose: Nose normal.  Pulmonary:     Effort: Pulmonary effort is normal.  Musculoskeletal:     Cervical back: Normal range of motion.  Neurological:     Mental Status: She is alert.     Mental Status Exam: General Appearance: Casual  Orientation:  Full (Time, Place, and Person)  Memory:  Immediate;   Good Recent;   Good Remote;   Good  Concentration:  Concentration: Good and Attention Span: Good  Recall:  Good  Attention  Good  Eye Contact:  Good  Speech:  Normal Rate  Language:  Good  Volume:  Normal  Mood: depressed, anxious  Affect:  Congruent  Thought Process:  Coherent  Thought Content:  Logical  Suicidal Thoughts:  No  Homicidal Thoughts:  No  Judgement:  Fair  Insight:  Good  Psychomotor Activity:  Decreased  Akathisia:  No  Fund of Knowledge:  Good      Assets:  Housing Leisure Time Physical Health Resilience Social Support  Cognition:  WNL  ADL's:  Intact  AIMS (if indicated):        Other History   These have been pulled in through the EMR, reviewed, and updated if appropriate.  Family History:  The patient's family history is not on file. She was adopted.  Medical History: Past Medical History:  Diagnosis Date   Anxiety    Depression    Dysphagia    Left upper quadrant abdominal pain 07/11/2020   Nausea and vomiting 07/11/2020   Throat pain    Vitamin D deficiency     Surgical History: Past Surgical History:  Procedure Laterality Date   CESAREAN SECTION     CHOLECYSTECTOMY       Medications:   Current Facility-Administered Medications:    acetaminophen (TYLENOL) tablet 650 mg, 650 mg, Oral, Q6H PRN **OR** acetaminophen (TYLENOL) suppository 650 mg, 650 mg, Rectal, Q6H PRN, Celinda Alm Lot,  MD   acyclovir (ZOVIRAX) tablet 400 mg, 400 mg, Oral, BID, Celinda Alm Lot, MD, 400 mg at 01/01/24 2149   busPIRone (BUSPAR) tablet 5 mg, 5 mg, Oral, TID, Celinda Alm Lot, MD, 5 mg at 01/01/24 2148   Chlorhexidine Gluconate Cloth 2 % PADS 6 each, 6 each, Topical, Daily, Celinda Alm Lot, MD, 6 each at 01/01/24 2233   clonazePAM (KLONOPIN) tablet 0.5 mg, 0.5 mg, Oral, Daily PRN, Celinda Alm Lot, MD   dextrose 50 % solution 25 g, 25 g, Intravenous, PRN, Celinda Alm Lot, MD   enoxaparin (LOVENOX) injection 60 mg, 60 mg, Subcutaneous, Q24H, Celinda Alm Lot, MD, 60 mg at 01/01/24 2148   escitalopram (LEXAPRO) tablet 10 mg, 10 mg, Oral, Daily, Celinda Alm Lot, MD, 10 mg at 01/01/24 0841   loratadine (CLARITIN) tablet 10 mg, 10 mg, Oral, Daily, Celinda Alm Lot, MD, 10 mg at 01/01/24 0840   methylPREDNISolone  sodium succinate (SOLU-MEDROL ) 40 mg/mL injection 40 mg, 40 mg, Intravenous, Once, Regalado, Belkys A, MD   morphine (PF) 4 MG/ML injection 4 mg, 4 mg, Intravenous, Q3H PRN, Celinda Alm Lot, MD   ondansetron  (ZOFRAN ) tablet 4 mg, 4 mg, Oral, Q6H PRN **OR** ondansetron  (ZOFRAN ) injection 4 mg, 4 mg, Intravenous, Q6H PRN, Celinda Alm Lot, MD   Oral care mouth rinse, 15 mL, Mouth Rinse, PRN, Celinda Alm Lot, MD   pantoprazole (PROTONIX) EC tablet 40 mg, 40 mg, Oral, Daily PRN, Celinda Alm Lot, MD   prazosin  (MINIPRESS ) capsule 5 mg, 5 mg, Oral, QHS PRN, Celinda Alm Lot, MD   QUEtiapine (SEROQUEL) tablet 25 mg, 25 mg, Oral, QHS, Celinda Alm Lot, MD  Allergies: Allergies  Allergen Reactions   Latex Anaphylaxis, Hives and Dermatitis   Sumatriptan Anaphylaxis and Hives   Vancomycin Itching, Swelling and Other (See Comments)    Made the mouth itch and lips swell   Other Other (See Comments)  Patient has Eosinophilic esophagitis, so she normally AVOIDS wheat, eggs, and dairy.   Avocado Rash   Banana Rash   Kiwi Extract Rash   Levofloxacin Rash  and Hypertension    Sharlot Becker, NP

## 2024-01-02 NOTE — Plan of Care (Signed)

## 2024-01-03 DIAGNOSIS — B370 Candidal stomatitis: Secondary | ICD-10-CM | POA: Diagnosis not present

## 2024-01-03 NOTE — Progress Notes (Signed)
 [facility]gent [facility]  Urgent Care Provider Note   Provider at bedside: 2:36 PM  History obtained from the: Patient  HISTORY   PATIENT ID: Cynthia Frazier is a 33 y.o. female.  CHIEF COMPLAINT: Chief Complaint  Patient presents with  . Thrush    Patient had a recent hospital visit where she was given treatments that could cause Thrush. Patient having difficulty swallowing, no breathing problems.      ALLERGIES: Allergies[1]   PAST MEDICAL HISTORY: PMH - Asthma Eczema PTSD (post-traumatic stress disorder)  CURRENT MEDICATIONS: Current Medications[2]  ROS  All other symptoms are reviewed and are negative except those listed in HPI   HPI   Cynthia Frazier is a 33 y.o. female  presents to Urgent care for white coating and tenderness sensation to tongue after use of multiple antibiotic and steroid for food poisoning hospitalization.  Patient states her symptoms started a couple of days ago and she scraped a lot of exudates from her tongue over the last 24 hours.  She denies fever or significant pain to her tongue, however tongue does feel swollen.  Denies sore throat  History of Present Illness   PHYSICAL EXAM   Vitals:   01/03/24 1412 01/03/24 1418  BP: (!) 133/101 (!) 139/95  Pulse: 79   Resp: 18   Temp: 98.8 F (37.1 C)   TempSrc: Tympanic   SpO2: 100%   Weight: 107 kg (236 lb)   Height: 1.626 m (5' 4)      Physical Exam Vitals and nursing note reviewed.  Constitutional:      General: She is not in acute distress.    Appearance: Normal appearance. She is not ill-appearing, toxic-appearing or diaphoretic.  HENT:     Head: Normocephalic and atraumatic.     Right Ear: Tympanic membrane, ear canal and external ear normal.     Left Ear: Tympanic membrane, ear canal and external ear normal.     Nose: Nose normal.     Mouth/Throat:     Pharynx: No posterior oropharyngeal erythema.     Comments: Geographic tongue with mild white coating Eyes:      Conjunctiva/sclera: Conjunctivae normal.  Cardiovascular:     Rate and Rhythm: Normal rate and regular rhythm.  Pulmonary:     Effort: Pulmonary effort is normal. No respiratory distress.  Skin:    General: Skin is warm and dry.     Capillary Refill: Capillary refill takes less than 2 seconds.  Neurological:     Mental Status: She is alert and oriented to person, place, and time.        RESULTS  No results found for this visit on 01/03/24.   ASSESSMENT/PLAN/MDM   1. Cynthia Frazier      Cynthia Frazier is a 32 y.o. female  presents to Urgent care for white coating and tenderness sensation to tongue after use of multiple antibiotic and steroid for food poisoning hospitalization.  On exam patient appears in no acute distress.  She is afebrile and nontachycardic.  Mouth shows a geographic tongue with mild white coating centrally possible for thrush.  Posterior pharynx is noninjected no gross swelling, airway is patent.  History and physical are consistent with mild thrush along with incidental geographic tongue.  Nystatin  swish is ordered.  She will return for new or worsening symptoms at any time.  Patient is agreeable to this plan.      UC DISPOSITION   Follow up with PCP  There are no Patient  Instructions on file for this visit.   Hand out provided, I discussed the findings today, diagnosis/differential diagnosis, plan and red flags that require return for reevaluation with PCP,  Urgent care or EMERGENCY. Patient/representitive was agreeable to outlined plan. Questions were answered and patient is stable for discharge.  Provider time spent in patient care today, inclusive of but not limited to clinical reassessment, review of diagnostic studies, and discharge preparation, was less than 30 minutes.  This document was created using the aid of voice recognition Scientist, clinical (histocompatibility and immunogenetics).  Electrically signed by Channing Sero ENP-C MSN at 2:49 PM        [1] Allergies Allergen  Reactions  . Latex Anaphylaxis, Hives and Other (See Comments)  . Sumatriptan Anaphylaxis, Hives and Other (See Comments)  . Vancomycin Anaphylaxis, Itching and Other (See Comments)  . Avocado Rash  . Banana Rash  . Kiwi (Actinidia Chinensis) Rash  . Levofloxacin Rash and Other (See Comments)    Other Reaction(s): Bradycardia  [2] .  acyclovir (ZOVIRAX) 800 mg tablet .  aspirin-acetaminophen-caffeine (Excedrin Extra Strength) 250-250-65 mg tab per tablet .  busPIRone (BUSPAR) 5 mg tablet .  clonazePAM (KlonoPIN) 0.5 mg tablet .  escitalopram (LEXAPRO) 10 mg tablet .  estradioL (ESTRACE) 0.01 % (0.1 mg/gram) vaginal cream .  fexofenadine  (ALLEGRA ) 180 mg tablet .  omeprazole (PriLOSEC) 20 mg DR capsule .  nystatin  (MYCOSTATIN ) 100,000 unit/mL suspension .  prazosin  (MINIPRESS ) 5 mg capsule No current facility-administered medications for this visit.

## 2024-01-04 ENCOUNTER — Telehealth: Payer: Self-pay

## 2024-01-04 NOTE — Transitions of Care (Post Inpatient/ED Visit) (Signed)
   01/04/2024  Name: Cynthia Frazier MRN: [id number] DOB: [birth year]  Today's TOC FU Call Status: Today's TOC FU Call Status:: Unsuccessful Call (1st Attempt) Unsuccessful Call (1st Attempt) Date: 01/04/24  Attempted to reach the patient regarding the most recent Inpatient/ED visit.  Follow Up Plan: Additional outreach attempts will be made to reach the patient to complete the Transitions of Care (Post Inpatient/ED visit) call.   [name] J. Demetrice Combes RN, MSN [facility] RN Care Manager Direct Dial: [phone]  Fax: [phone] Website: [email or web address]

## 2024-01-07 ENCOUNTER — Telehealth: Payer: Self-pay

## 2024-01-07 NOTE — Transitions of Care (Post Inpatient/ED Visit) (Signed)
 01/07/2024  Name: Cynthia Frazier MRN: [id number] DOB: 11/19/1990  Today's TOC FU Call Status: Today's TOC FU Call Status:: Successful TOC FU Call Completed TOC FU Call Complete Date: 01/07/24 Patient's Name and Date of Birth confirmed.  Transition Care Management Follow-up Telephone Call Date of Discharge: 01/02/24 Discharge Facility: [facility] Type of Discharge: Inpatient Admission Primary Inpatient Discharge Diagnosis:: Hypoglycemia How have you been since you were released from the hospital?: Better Any questions or concerns?: No  Items Reviewed: Did you receive and understand the discharge instructions provided?: Yes Medications obtained,verified, and reconciled?: Yes (Medications Reviewed) Any new allergies since your discharge?: No Dietary orders reviewed?: Yes Type of Diet Ordered:: heart healthy Do you have support at home?: Yes People in Home [RPT]: spouse Name of Support/Comfort Primary Source: [name]  Medications Reviewed Today: Medications Reviewed Today     Reviewed by Ikaika Showers, RN (Case Manager) on 01/07/24 at 1407  Med List Status: <None>   Medication Order Taking? Sig Documenting Provider Last Dose Status Informant  acyclovir (ZOVIRAX) 800 MG tablet [id number] Yes Take 400 mg by mouth in the morning and at bedtime. [provider]  Active Self  ADVIL  200 MG CAPS [id number] Yes Take 800 mg by mouth 2 (two) times daily as needed (for pain or headaches). [provider]  Active Self  busPIRone (BUSPAR) 5 MG tablet [id number] Yes Take 5 mg by mouth 3 (three) times daily. [provider]  Active Self  clonazePAM (KLONOPIN) 0.5 MG tablet [id number] Yes Take 0.5 mg by mouth daily as needed for anxiety (or sleep). [provider]  Active Self  escitalopram (LEXAPRO) 10 MG tablet [id number] Yes Take 10 mg by mouth daily. [provider]  Active Self  estradiol (ESTRACE) 0.01 % CREA vaginal cream [id number] Yes  Place vaginally See admin instructions. Apply to affected (vaginal) area up to three times a week [provider]  Active Self  EXCEDRIN EXTRA STRENGTH 250-250-65 MG tablet [id number] Yes Take 1-2 tablets by mouth every 6 (six) hours as needed for headache or migraine (or pain). [provider]  Active Self  fexofenadine  (ALLEGRA ) 180 MG tablet [id number] Yes Take 180 mg by mouth in the morning. [provider]  Active Self  fluticasone  (FLONASE ) 50 MCG/ACT nasal spray [id number] Yes Place 2 sprays into both nostrils daily.  Patient taking differently: Place 2 sprays into both nostrils daily as needed (for seasonal allergies).   Kennyth Domino, FNP  Active Self  fluticasone -salmeterol (ADVAIR DISKUS) 250-50 MCG/ACT AEPB [id number] Yes 1 puff See admin instructions. Inhale 1 puff into the mouth and swallow for Eosinophilic esophagitis one to two times a day [provider]  Active Self  montelukast  (SINGULAIR ) 10 MG tablet [id number]  TAKE 1 TABLET BY MOUTH AT BEDTIME  Patient not taking: Reported on 01/07/2024   Jeneal Danita Macintosh, MD  Active Self  Multiple Vitamins-Calcium (ONE-A-DAY WOMENS FORMULA) TABS [id number] Yes Take 1 tablet by mouth daily with breakfast. [provider]  Active Self  naproxen  (NAPROSYN ) 500 MG tablet [id number] Yes Take 1 tablet (500 mg total) by mouth 2 (two) times daily with a meal.  Patient taking differently: Take 500 mg by mouth 2 (two) times daily as needed for mild pain (pain score 1-3) (take with food).   Christopher Savannah, PA-C  Active Self  Olopatadine  HCl 0.2 % SOLN [id number] Yes Apply 1 drop to eye 2 (two) times daily.  Patient taking differently: Place 1 drop into both eyes 2 (  two) times daily as needed (for seasonal allergies).   Jeneal Danita Macintosh, MD  Active Self  ondansetron  (ZOFRAN -ODT) 4 MG disintegrating tablet [id number] Yes Take 1 tablet (4 mg total) by mouth every 8 (eight) hours as needed.  Patient taking  differently: Take 4 mg by mouth every 8 (eight) hours as needed for nausea or vomiting (dissolve orally).   Carita Senior, MD  Active Self  pantoprazole (PROTONIX) 40 MG tablet [id number] Yes Take 40 mg by mouth daily as needed (for reflux symptoms- in the morning/30 minutes before breakfast). [provider]  Active Self  prazosin  (MINIPRESS ) 5 MG capsule [id number] Yes Take 5 mg by mouth at bedtime as needed (for nightmares). [provider]  Active Self  promethazine  (PHENERGAN ) 25 MG tablet [id number] Yes Take 1 tablet (25 mg total) by mouth every 6 (six) hours as needed for nausea or vomiting. Elnor Bernarda SQUIBB, DO  Active Self  QUEtiapine (SEROQUEL) 50 MG tablet [id number] Yes Take 25 mg by mouth at bedtime. [provider]  Active Self           Med Note MARISA, NATHANEL SAILOR   Thu Dec 31, 2023  5:41 PM) 0.5 tablet = 25 mg            Home Care and Equipment/Supplies: Were Home Health Services Ordered?: NA Any new equipment or medical supplies ordered?: NA  Functional Questionnaire: Do you need assistance with bathing/showering or dressing?: No Do you need assistance with meal preparation?: No Do you need assistance with eating?: No Do you have difficulty maintaining continence: No Do you need assistance with getting out of bed/getting out of a chair/moving?: No Do you have difficulty managing or taking your medications?: No  Follow up appointments reviewed: PCP Follow-up appointment confirmed?: Yes Date of PCP follow-up appointment?: 01/12/24 Follow-up Provider: Dr. Verdia [facility] Follow-up appointment confirmed?: NA Do you need transportation to your follow-up appointment?: No Do you understand care options if your condition(s) worsen?: Yes-patient verbalized understanding  SDOH Interventions Today    Flowsheet Row Most Recent Value  SDOH Interventions   Food Insecurity Interventions Intervention Not Indicated  [States they are doing  fine right now]  Housing Interventions Intervention Not Indicated  [Patient states they are doing fine right now]  Transportation Interventions Intervention Not Indicated  Utilities Interventions Intervention Not Indicated  [patient states they are fine now]    Omarr Hann J. Toshiyuki Fredell RN, MSN [facility]  [facility], [facility] RN Care Manager Direct Dial: [phone]  Fax: [phone] Website: [email or web address]

## 2024-01-07 NOTE — Patient Instructions (Addendum)
 Visit Information  Thank you for taking time to visit with me today.   Signs of hypoglycemia  Shakiness or tremors  Sweating  Rapid or irregular heartbeat (palpitations)  Anxiety, nervousness, or irritability Dizziness or lightheadedness  Hunger or nausea  Paleness   If you experience these: check your sugar, treat with juice or glucose tablets then recheck in 15 minutes, Severe case such as loss of consciousness call 911  Patient verbalizes understanding of instructions and care plan provided today and agrees to view in MyChart. Active MyChart status and patient understanding of how to access instructions and care plan via MyChart confirmed with patient.     The patient has been provided with contact information for the care management team and has been advised to call with any health related questions or concerns.   Please call the care guide team at [phone] if you need to cancel or reschedule your appointment.   Please call the Suicide and Crisis Lifeline: 988 if you are experiencing a Mental Health or Behavioral Health Crisis or need someone to talk to.  Paysen Goza J. Lipa Knauff RN, MSN [facility], [facility] RN Care Manager Direct Dial: [phone]  Fax: [phone] Website: [email or web address]

## 2024-01-08 LAB — SULFONYLUREA HYPOGLYCEMICS PANEL, SERUM
Acetohexamide: NEGATIVE ug/mL (ref 20–60)
Chlorpropamide: NEGATIVE ug/mL (ref 75–250)
Glimepiride: NEGATIVE ng/mL (ref 80–250)
Glipizide: NEGATIVE ng/mL (ref 200–1000)
Glyburide: NEGATIVE ng/mL
Nateglinide: NEGATIVE ng/mL
Repaglinide: NEGATIVE ng/mL
Tolazamide: NEGATIVE ug/mL
Tolbutamide: NEGATIVE ug/mL (ref 40–100)

## 2024-01-11 LAB — MISCELLANEOUS TEST

## 2024-01-12 DIAGNOSIS — F4310 Post-traumatic stress disorder, unspecified: Secondary | ICD-10-CM | POA: Diagnosis not present

## 2024-01-12 DIAGNOSIS — K529 Noninfective gastroenteritis and colitis, unspecified: Secondary | ICD-10-CM | POA: Diagnosis not present

## 2024-01-13 LAB — PROINSULIN/INSULIN RATIO
Insulin: 144 u[IU]/mL — ABNORMAL HIGH
Proinsulin: 3.6 pmol/L

## 2024-01-15 DIAGNOSIS — F410 Panic disorder [episodic paroxysmal anxiety] without agoraphobia: Secondary | ICD-10-CM | POA: Diagnosis not present

## 2024-01-15 DIAGNOSIS — F4312 Post-traumatic stress disorder, chronic: Secondary | ICD-10-CM | POA: Diagnosis not present

## 2024-01-15 DIAGNOSIS — F332 Major depressive disorder, recurrent severe without psychotic features: Secondary | ICD-10-CM | POA: Diagnosis not present

## 2024-01-20 DIAGNOSIS — F332 Major depressive disorder, recurrent severe without psychotic features: Secondary | ICD-10-CM | POA: Diagnosis not present

## 2024-02-08 ENCOUNTER — Emergency Department (HOSPITAL_COMMUNITY): Admit: 2024-02-08 | Discharge: 2024-02-08 | Disposition: A

## 2024-02-08 ENCOUNTER — Emergency Department (HOSPITAL_COMMUNITY)
Admission: EM | Admit: 2024-02-08 | Discharge: 2024-02-08 | Disposition: A | Discharge status: Home / Self Care | Source: Ambulatory Visit | Attending: Emergency Medicine | Admitting: Emergency Medicine

## 2024-02-08 ENCOUNTER — Encounter (HOSPITAL_COMMUNITY): Payer: Self-pay | Admitting: Emergency Medicine

## 2024-02-08 DIAGNOSIS — Z91040 Latex allergy status: Secondary | ICD-10-CM | POA: Diagnosis not present

## 2024-02-08 DIAGNOSIS — M79601 Pain in right arm: Secondary | ICD-10-CM | POA: Diagnosis not present

## 2024-02-08 DIAGNOSIS — I82611 Acute embolism and thrombosis of superficial veins of right upper extremity: Secondary | ICD-10-CM | POA: Diagnosis not present

## 2024-02-08 DIAGNOSIS — M79621 Pain in right upper arm: Secondary | ICD-10-CM | POA: Diagnosis not present

## 2024-02-08 LAB — CBC WITH DIFFERENTIAL/PLATELET
Abs Immature Granulocytes: 0.02 K/uL (ref 0.00–0.07)
Basophils Absolute: 0.1 K/uL (ref 0.0–0.1)
Basophils Relative: 1 %
Eosinophils Absolute: 0.1 K/uL (ref 0.0–0.5)
Eosinophils Relative: 1 %
HCT: 44.3 % (ref 36.0–46.0)
Hemoglobin: 14.7 g/dL (ref 12.0–15.0)
Immature Granulocytes: 0 %
Lymphocytes Relative: 21 %
Lymphs Abs: 1.6 K/uL (ref 0.7–4.0)
MCH: 31.9 pg (ref 26.0–34.0)
MCHC: 33.2 g/dL (ref 30.0–36.0)
MCV: 96.1 fL (ref 80.0–100.0)
Monocytes Absolute: 0.4 K/uL (ref 0.1–1.0)
Monocytes Relative: 6 %
Neutro Abs: 5.5 K/uL (ref 1.7–7.7)
Neutrophils Relative %: 71 %
Platelets: 263 K/uL (ref 150–400)
RBC: 4.61 MIL/uL (ref 3.87–5.11)
RDW: 12.5 % (ref 11.5–15.5)
WBC: 7.7 K/uL (ref 4.0–10.5)
nRBC: 0 % (ref 0.0–0.2)

## 2024-02-08 LAB — COMPREHENSIVE METABOLIC PANEL WITH GFR
ALT: 27 U/L (ref 0–44)
AST: 20 U/L (ref 15–41)
Albumin: 4.8 g/dL (ref 3.5–5.0)
Alkaline Phosphatase: 94 U/L (ref 38–126)
Anion gap: 11 (ref 5–15)
BUN: 17 mg/dL (ref 6–20)
CO2: 23 mmol/L (ref 22–32)
Calcium: 9.7 mg/dL (ref 8.9–10.3)
Chloride: 108 mmol/L (ref 98–111)
Creatinine, Ser: 0.7 mg/dL (ref 0.44–1.00)
GFR, Estimated: >60 mL/min (ref >60)
Glucose, Bld: 91 mg/dL (ref 70–99)
Potassium: 4.3 mmol/L (ref 3.5–5.1)
Sodium: 141 mmol/L (ref 135–145)
Total Bilirubin: 0.6 mg/dL (ref 0.0–1.2)
Total Protein: 7.6 g/dL (ref 6.5–8.1)

## 2024-02-08 MED ORDER — RIVAROXABAN (XARELTO) VTE STARTER PACK (15 & 20 MG): 1.0000 | ORAL_TABLET | ORAL | 0 refills | Status: DC

## 2024-02-08 NOTE — ED Provider Notes (Signed)
 [place] EMERGENCY DEPARTMENT AT [facility] Provider Note   CSN: [id number] Arrival date & time: 02/08/24  1115     Patient presents with: Arm Pain   Cynthia Frazier is a 33 y.o. female.  Patient with past history significant for anxiety, depression, obesity presents to the emergency department today with concerns of right arm pain.  Was reportedly seen by her PCP and advised to come to the emergency department for evaluation for evaluation of possible DVT.  Patient reports a history of DVT in one of her upper arms years ago after surgery.  Reportedly this was due to IV placement.  She has no other history of spontaneous DVT.  She was on anticoagulants for 3 months at that time and has not been on any blood thinner since then.  No reported leg swelling, arm swelling, or feelings of shortness of breath.   Arm Pain       Prior to Admission medications   Medication Sig Start Date End Date Taking? Authorizing Provider  acyclovir  (ZOVIRAX ) 800 MG tablet Take 400 mg by mouth in the morning and at bedtime. 12/17/23   [provider]  ADVIL  200 MG CAPS Take 800 mg by mouth 2 (two) times daily as needed (for pain or headaches).    [provider]  busPIRone  (BUSPAR ) 5 MG tablet Take 5 mg by mouth 3 (three) times daily.    [provider]  clonazePAM  (KLONOPIN ) 0.5 MG tablet Take 0.5 mg by mouth daily as needed for anxiety (or sleep).    [provider]  escitalopram  (LEXAPRO ) 10 MG tablet Take 10 mg by mouth daily.    [provider]  estradiol (ESTRACE) 0.01 % CREA vaginal cream Place vaginally See admin instructions. Apply to affected (vaginal) area up to three times a week    [provider]  EXCEDRIN EXTRA STRENGTH 250-250-65 MG tablet Take 1-2 tablets by mouth every 6 (six) hours as needed for headache or migraine (or pain).    [provider]  fexofenadine  (ALLEGRA ) 180 MG tablet Take 180 mg by mouth in the  morning.    [provider]  fluticasone  (FLONASE ) 50 MCG/ACT nasal spray Place 2 sprays into both nostrils daily. Patient taking differently: Place 2 sprays into both nostrils daily as needed (for seasonal allergies). 04/08/22   Kennyth Domino, FNP  fluticasone -salmeterol (ADVAIR DISKUS) 250-50 MCG/ACT AEPB 1 puff See admin instructions. Inhale 1 puff into the mouth and swallow for Eosinophilic esophagitis one to two times a day    [provider]  montelukast  (SINGULAIR ) 10 MG tablet TAKE 1 TABLET BY MOUTH AT BEDTIME Patient not taking: Reported on 01/07/2024 01/13/22   Jeneal Danita Macintosh, MD  Multiple Vitamins-Calcium (ONE-A-DAY WOMENS FORMULA) TABS Take 1 tablet by mouth daily with breakfast.    [provider]  naproxen  (NAPROSYN ) 500 MG tablet Take 1 tablet (500 mg total) by mouth 2 (two) times daily with a meal. Patient taking differently: Take 500 mg by mouth 2 (two) times daily as needed for mild pain (pain score 1-3) (take with food). 11/05/22   Christopher Savannah, PA-C  Olopatadine  HCl 0.2 % SOLN Apply 1 drop to eye 2 (two) times daily. Patient taking differently: Place 1 drop into both eyes 2 (two) times daily as needed (for seasonal allergies). 07/18/21   Jeneal Danita Macintosh, MD  ondansetron  (ZOFRAN -ODT) 4 MG disintegrating tablet Take 1 tablet (4 mg total) by mouth every 8 (eight) hours as needed. Patient taking  differently: Take 4 mg by mouth every 8 (eight) hours as needed for nausea or vomiting (dissolve orally). 12/30/23   Rancour, Garnette, MD  pantoprazole  (PROTONIX ) 40 MG tablet Take 40 mg by mouth daily as needed (for reflux symptoms- in the morning/30 minutes before breakfast).    [provider]  prazosin  (MINIPRESS ) 5 MG capsule Take 5 mg by mouth at bedtime as needed (for nightmares). 05/14/23   [provider]  promethazine  (PHENERGAN ) 25 MG tablet Take 1 tablet (25 mg total) by mouth every 6 (six) hours as needed for nausea or  vomiting. 04/13/23   Elnor Hila P, DO  QUEtiapine  (SEROQUEL ) 50 MG tablet Take 25 mg by mouth at bedtime. 12/07/23   [provider]    Allergies: Latex, Sumatriptan, Vancomycin, Other, Avocado, Banana, Kiwi extract, and Levofloxacin    Review of Systems  Musculoskeletal:        Right arm pain  All other systems reviewed and are negative.   Updated Vital Signs BP 138/85   Pulse 64   Temp 98 F (36.7 C)   Resp 16   SpO2 100%   Physical Exam Vitals and nursing note reviewed.  Constitutional:      General: She is not in acute distress.    Appearance: She is well-developed.  HENT:     Head: Normocephalic and atraumatic.  Eyes:     Conjunctiva/sclera: Conjunctivae normal.  Cardiovascular:     Rate and Rhythm: Normal rate and regular rhythm.     Heart sounds: No murmur heard. Pulmonary:     Effort: Pulmonary effort is normal. No respiratory distress.     Breath sounds: Normal breath sounds.  Abdominal:     Palpations: Abdomen is soft.     Tenderness: There is no abdominal tenderness.  Musculoskeletal:        General: Tenderness present. No swelling.       Arms:     Cervical back: Neck supple.     Comments: Right upper arm tenderness above the level of the antecubital fossa.  There is an area of induration with no appreciable skin changes.  This area is tender on exam.  No evidence of swelling to the right arm compared to the left.  Skin:    General: Skin is warm and dry.     Capillary Refill: Capillary refill takes less than 2 seconds.  Neurological:     Mental Status: She is alert.  Psychiatric:        Mood and Affect: Mood normal.     (all labs ordered are listed, but only abnormal results are displayed) Labs Reviewed  CBC WITH DIFFERENTIAL/PLATELET  COMPREHENSIVE METABOLIC PANEL WITH GFR    EKG: None  Radiology: No results found.   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   This patient presents to the ED for concern of arm pain.  Differential diagnosis includes thrombophlebitis, DVT, cellulitis, edema    Additional history obtained:  Additional history obtained  from chart review   Lab Tests:  I Ordered, and personally interpreted labs.  The pertinent results include:  CBC and CMP pending.   Imaging Studies ordered:  I ordered imaging studies including ultrasound of the right upper extremity. I independently visualized and interpreted imaging which showed pending at time of sign out I agree with the radiologist interpretation   Problem List / ED Course:  Patient presents to the ED with concerns of right upper arm pain. Reportedly noticed pain on the inner right upper arm about 4 days ago. Previously has had DVT in similar area. States last DVT provoked by IV during time of a surgery and she recently had IV and is concerned for similar occurrence this time. She denies any leg swelling, pain, shortness of breath, or chest pain. On exam, there is an area of induration and tenderness to the right inner arm. No appreciable swelling of the right arm compared top the left. Legs unremarkable. No increased WOB. Based on exam and history, will obtain US  of the right upper extremity for further evaluation of possible DVT.  Delay in getting US  DVT of right upper extremity. Attempted to conctact vascular US  tech on listed phone number and got no answer from them x3. Unclear if they are here or at another site at this time. Informed patient of delay and would still like to have US  performed for DVT evaluation. Added on CBC and CMP to prevent further delay if DVT US  were to be positive  so anticoagulation selection can be made.  3:03 PM Care of Robinette Esters transferred to [facility] and Dr. Francesca at the end of my shift as the patient will require reassessment once labs/imaging have resulted. Patient presentation, ED course, and plan of care discussed with review of all pertinent labs and imaging. Please see his/her note for further details regarding further ED course and disposition. Plan at time of handoff is pending US  DVT results and labs. Likely discharge home with conservative management for thrombophlebitis. This may be altered or completely changed at the discretion of the oncoming team pending results of further workup.    Social Determinants of Health:  None  Final diagnoses:  None    ED Discharge Orders     None          Cecily Legrand LABOR, PA-C 02/08/24 1504    Mannie Pac T, DO 02/09/24 [phone]

## 2024-02-08 NOTE — ED Triage Notes (Signed)
 Patient c/o right arm pain x4 days. Patient report PCP recommended to be seen to r/o dvt on right arm. Patient denies Chest pain and SOB. Hx DVT

## 2024-02-08 NOTE — Discharge Instructions (Addendum)
 Due to the extent of the superficial clot in the arm I am going to restart your blood thinning medication, please begin taking the medication as prescribed, and follow-up with your primary care doctor to discuss whether they would like you to stay on this medication long-term or whether you would qualify for discontinuing the medication at some time in the future.  You can contact the vascular surgeon whose contact information I provided above to discuss additional management.

## 2024-02-08 NOTE — ED Provider Notes (Signed)
 Accepted handoff at shift change from [name] PA-C. Please see prior provider note for more detail.   Briefly: Patient is 33 y.o.   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  Upper extremity venous duplex shows Superficial basilic and cephalic acute on chronic clot all the way to upper extremity axillary region  -- Given her history, and significant soft tissue swelling noted as well as the extent of the superficial clot I do think the patient would benefit from anticoagulation, recommended close follow-up with PCP as well as vascular surgery, patient understands and agrees to plan for close PCP and vascular follow up with plan to restart xarelto  at this time   Cynthia Frazier 02/08/24 1719    Francesca Elsie CROME, MD 02/08/24 2114

## 2024-02-08 NOTE — Progress Notes (Addendum)
 VASCULAR LAB    Right upper extremity venous duplex has been performed.  See CV proc for preliminary results.  Gave verbal report to Sherlean Carota, PA-C  Tateanna Bach, RVT 02/08/2024, 4:46 PM

## 2024-02-12 DIAGNOSIS — F4312 Post-traumatic stress disorder, chronic: Secondary | ICD-10-CM | POA: Diagnosis not present

## 2024-02-12 DIAGNOSIS — F410 Panic disorder [episodic paroxysmal anxiety] without agoraphobia: Secondary | ICD-10-CM | POA: Diagnosis not present

## 2024-02-12 DIAGNOSIS — F332 Major depressive disorder, recurrent severe without psychotic features: Secondary | ICD-10-CM | POA: Diagnosis not present

## 2024-02-15 ENCOUNTER — Encounter: Payer: Self-pay | Admitting: Student-PharmD

## 2024-02-15 ENCOUNTER — Ambulatory Visit: Attending: Surgery | Admitting: Student-PharmD

## 2024-02-15 DIAGNOSIS — I82611 Acute embolism and thrombosis of superficial veins of right upper extremity: Secondary | ICD-10-CM

## 2024-02-15 MED ORDER — RIVAROXABAN 20 MG PO TABS: 20.0000 mg | ORAL_TABLET | Freq: Every day | ORAL | 1 refills | Status: AC

## 2024-02-15 NOTE — Progress Notes (Cosign Needed)
 DVT Clinic Note  Name: Cynthia Frazier     MRN: [id number]     DOB: 05/05/1990     Sex: female  PCP: Verdia Lombard, MD  Today's Visit: Visit Information: Initial Visit  Referred to DVT Clinic by: Emergency Department - Dr. Francesca Referred to CPP by: Dr. Serene Reason for referral:  Chief Complaint  Patient presents with   Med Management - DVT   HISTORY OF PRESENT ILLNESS: Cynthia Frazier is a 33 y.o. female who presents after diagnosis of DVT for medication management. Patient presented to the ED 02/08/24 with right arm pain. 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 She was started on anticoagulation in the ED and referred to DVT Clinic for follow up. Patient endorses prior history of DVT in the upper extremity in 2018 which was after gallbladder surgery and related to IV placement. This was in the left arm and did extend into the axillary vein. Also has a history of DVT during pregnancy (RLE calf DVT) in 2017. Endorses heavy menstrual bleeding at baseline. Was on Xarelto  for previous DVT and tolerated this well. Denies abnormal bruising or bleeding thus far though she did just start her cycle. Taking it at 11am and 11pm and is taking it with food. Asks if she can see hematology for history of recurrent clots.   Positive Thrombotic Risk Factors: Previous VTE, Recent admission to hospital with acute illness (within 3 months), Other (comment), Obesity (IVs placed in recent hospitalization) Bleeding Risk Factors: Anticoagulant therapy  Negative Thrombotic Risk Factors: Recent surgery (within 3 months), Recent trauma (within 3 months), Paralysis, paresis, or recent plaster cast immobilization of lower extremity, Central venous catheterization, Bed rest >72 hours within 3 months, Sedentary journey lasting >8  hours within 4 weeks, Pregnancy, Within 6 weeks postpartum, Recent cesarean section (within 3 months), Testosterone therapy, Erythropoiesis-stimulating agent, Recent COVID diagnosis (within 3 months), Active cancer, Non-malignant, chronic inflammatory condition, Known thrombophilic condition, Smoking, Older age  Rx Insurance Coverage: Commercial Rx Affordability: Has met OOP max for the year so Xarelto  is $0. Counseled on availability of copay card online should her copay in January be more than $10.  Rx Assistance Provided: None needed at this time Preferred Pharmacy: Refills sent to [facility] per patient request.  Past Medical History:  Diagnosis Date   Anxiety    Depression    Dysphagia    Left upper quadrant abdominal pain 07/11/2020   Nausea and vomiting 07/11/2020   Throat pain    Vitamin D deficiency     Past Surgical History:  Procedure Laterality Date   CESAREAN SECTION     CHOLECYSTECTOMY      Social History   Socioeconomic History   Marital status: Married    Spouse name: Not on file   Number of children: Not on file   Years of education: Not on file   Highest education level: Not on file  Occupational History   Not on file  Tobacco Use   Smoking status: Never   Smokeless tobacco: Never  Vaping Use   Vaping status: Never Used  Substance and Sexual Activity   Alcohol use: Yes    Comment: 2 times/week   Drug use: Never   Sexual activity: Yes    Birth control/protection: I.U.D.  Other Topics Concern   Not on file  Social History Narrative  Not on file   Social Drivers of Health   Tobacco Use: Low Risk (02/15/2024)   Patient History    Smoking Tobacco Use: Never    Smokeless Tobacco Use: Never    Passive Exposure: Not on file  Financial Resource Strain: Not on file  Food Insecurity: Medium Risk (01/07/2024)   Received from [facility]   Epic    Within the past 12 months, you worried that your food would run out before you got money to buy more:  Sometimes true    Within the past 12 months, the food you bought just didn't last and you didn't have money to get more. : Sometimes true  Transportation Needs: No Transportation Needs (01/07/2024)   Received from [facility]    In the past 12 months, has lack of reliable transportation kept you from medical appointments, meetings, work or from getting things needed for daily living? : No  Physical Activity: Not on file  Stress: Not on file  Social Connections: Not on file  Intimate Partner Violence: Not At Risk (01/07/2024)   Epic    Fear of Current or Ex-Partner: No    Emotionally Abused: No    Physically Abused: No    Sexually Abused: No  Depression (PHQ2-9): Low Risk (01/07/2024)   Depression (PHQ2-9)    PHQ-2 Score: 0  Alcohol Screen: Not on file  Housing: Low Risk (01/07/2024)   Received from [facility]   Epic    What is your living situation today?: I have a steady place to live    Think about the place you live. Do you have problems with any of the following? Choose all that apply:: None/None on this list  Recent Concern: Housing - High Risk (01/07/2024)   Epic    Unable to Pay for Housing in the Last Year: Yes    Number of Times Moved in the Last Year: Not on file    Homeless in the Last Year: No  Utilities: Low Risk (01/07/2024)   Received from [facility]   Utilities    In the past 12 months has the electric, gas, oil, or water company threatened to shut off services in your home? : No  Recent Concern: Utilities - At Risk (01/07/2024)   Epic    Threatened with loss of utilities: Yes  Health Literacy: Not on file    Family History  Adopted: Yes    Allergies as of 02/15/2024 - Review Complete 02/15/2024  Allergen Reaction Noted   Latex Anaphylaxis, Hives, and Dermatitis 12/05/2014   Sumatriptan Anaphylaxis and Hives 12/05/2014   Vancomycin Itching, Swelling, and Other (See Comments) 07/25/2015   Other Other (See Comments) 12/31/2023   Avocado  Rash 06/21/2015   Banana Rash 06/21/2015   Kiwi extract Rash 06/21/2015   Levofloxacin Rash and Hypertension 12/05/2014    Medications Ordered Prior to Encounter[1] REVIEW OF SYSTEMS:  Review of Systems  Respiratory:  Negative for shortness of breath.   Cardiovascular:  Negative for chest pain and palpitations.  Neurological:  Negative for dizziness and tingling.   PHYSICAL EXAMINATION:  Physical Exam Musculoskeletal:        General: Swelling and tenderness present.  Skin:    Findings: No bruising or erythema.  Psychiatric:        Mood and Affect: Mood normal.        Behavior: Behavior normal.        Thought Content: Thought content normal.   LABS:  CBC  Component Value Date/Time   WBC 7.7 02/08/2024 1535   RBC 4.61 02/08/2024 1535   HGB 14.7 02/08/2024 1535   HGB 13.7 05/13/2021 1617   HCT 44.3 02/08/2024 1535   HCT 39.9 05/13/2021 1617   PLT 263 02/08/2024 1535   PLT 325 05/13/2021 1617   MCV 96.1 02/08/2024 1535   MCV 90 05/13/2021 1617   MCH 31.9 02/08/2024 1535   MCHC 33.2 02/08/2024 1535   RDW 12.5 02/08/2024 1535   RDW 11.8 05/13/2021 1617   LYMPHSABS 1.6 02/08/2024 1535   LYMPHSABS 2.5 05/13/2021 1617   MONOABS 0.4 02/08/2024 1535   EOSABS 0.1 02/08/2024 1535   EOSABS 0.4 05/13/2021 1617   BASOSABS 0.1 02/08/2024 1535   BASOSABS 0.1 05/13/2021 1617    Hepatic Function      Component Value Date/Time   PROT 7.6 02/08/2024 1535   PROT 6.9 07/11/2020 1600   ALBUMIN 4.8 02/08/2024 1535   ALBUMIN 4.5 07/11/2020 1600   AST 20 02/08/2024 1535   ALT 27 02/08/2024 1535   ALKPHOS 94 02/08/2024 1535   BILITOT 0.6 02/08/2024 1535   BILITOT 0.4 07/11/2020 1600   BILIDIR 0.2 12/15/2023 2256   IBILI 0.2 (L) 12/15/2023 2256    Renal Function   Lab Results  Component Value Date   CREATININE 0.70 02/08/2024   CREATININE 0.67 01/01/2024   CREATININE 0.75 12/31/2023    CrCl cannot be calculated (Unknown ideal weight.).   [facility] Vascular Lab Studies:   02/08/24 VAS US  UPPER EXTREMITY VENOUS DUPLEX RIGHT Summary:    Right:  No evidence of deep vein thrombosis in the upper extremity. Acute on  chronic superficial venous thrombosis noted in the right cephalic and basilic  veins, extending to just before the confluence with the deep system.    Left:  No evidence of thrombosis in the subclavian.   ASSESSMENT: Location of DVT: Right upper extremity Cause of DVT: provoked by a transient risk factor  Patient with remote history of upper extremity thrombus s/p surgery from an IV, now with nonocclusive acute on chronic superficial venous thrombosis in the right cephalic and basilic veins, extending to just before the confluence with the deep system. Patient previously tolerated Xarelto  well, so this was restarted in the ED. Discussed baseline heavy menstrual bleeding. She will see how Xarelto  worsens menstrual bleeding. Could consider switching to Eliquis in the future if needed which has some data for worsening menstrual bleeding slightly less than Xarelto . She is taking Xarelto  correctly, and no barriers were identified today to medication adherence or access. Given that this is her third thrombosis (RLE DVT 2017, LUE DVT 2018, now with superficial vein thrombosis 2025), she asks if it would be reasonable to see a hematologist. She is adopted and doesn't have a full medical history of her biological parents. Agreed that it would be reasonable to further investigate causes, so we've referred her to hematology. Will defer duration of treatment and further management to them. Her husband is established at the [facility] and prefers to be scheduled there.   PLAN: -Continue rivaroxaban  (Xarelto ) 15 mg twice daily with food for 21 days followed by 20 mg daily with food. -Expected duration of therapy: per hematology. Therapy started on 02/08/24. -Patient educated on purpose, proper use and potential adverse effects of rivaroxaban  (Xarelto ). -Discussed  importance of taking medication around the same time every day. -Advised patient of medications to avoid (NSAIDs, aspirin doses >100 mg daily). -Educated that Tylenol  (acetaminophen ) is the preferred analgesic  to lower the risk of bleeding. -Advised patient to alert all providers of anticoagulation therapy prior to starting a new medication or having a procedure. -Emphasized importance of monitoring for signs and symptoms of bleeding (abnormal bruising, prolonged bleeding, nose bleeds, bleeding from gums, discolored urine, black tarry stools). -Educated patient to present to the ED if emergent signs and symptoms of new thrombosis occur.  Follow up: Referred to hematology. DVT Clinic as needed.   Lum Herald, PharmD, [place], CPP [facility] [phone]   I have evaluated the patient's chart/imaging and refer this patient to the Clinical Pharmacist Practitioner for medication management. I have reviewed the CPP's documentation and agree with her assessment and plan. I was immediately available during the visit for questions and collaboration.   Malvina New, MD     [1]  Current Outpatient Medications on File Prior to Visit  Medication Sig Dispense Refill   acyclovir  (ZOVIRAX ) 800 MG tablet Take 400 mg by mouth in the morning and at bedtime.     busPIRone  (BUSPAR ) 15 MG tablet Take 15 mg by mouth 3 (three) times daily.     clonazePAM  (KLONOPIN ) 0.5 MG tablet Take 0.5 mg by mouth daily as needed for anxiety (or sleep).     escitalopram  (LEXAPRO ) 10 MG tablet Take 10 mg by mouth daily.     estradiol (ESTRACE) 0.01 % CREA vaginal cream Place vaginally See admin instructions. Apply to affected (vaginal) area up to three times a week     fexofenadine  (ALLEGRA ) 180 MG tablet Take 180 mg by mouth in the morning.     fluticasone -salmeterol (ADVAIR DISKUS) 250-50 MCG/ACT AEPB 1 puff See admin instructions. Inhale 1 puff into the mouth and swallow for  Eosinophilic esophagitis one to two times a day     Multiple Vitamins-Calcium (ONE-A-DAY WOMENS FORMULA) TABS Take 1 tablet by mouth daily with breakfast.     Olopatadine  HCl 0.2 % SOLN Apply 1 drop to eye 2 (two) times daily. 2.5 mL 5   ondansetron  (ZOFRAN -ODT) 4 MG disintegrating tablet Take 1 tablet (4 mg total) by mouth every 8 (eight) hours as needed. 20 tablet 0   pantoprazole  (PROTONIX ) 40 MG tablet Take 40 mg by mouth daily as needed (for reflux symptoms- in the morning/30 minutes before breakfast). (Patient taking differently: Take 40 mg by mouth daily.)     prazosin  (MINIPRESS ) 5 MG capsule Take 5 mg by mouth at bedtime as needed (for nightmares). (Patient taking differently: Take 5 mg by mouth at bedtime.)     promethazine  (PHENERGAN ) 25 MG tablet Take 1 tablet (25 mg total) by mouth every 6 (six) hours as needed for nausea or vomiting. 30 tablet 0   RIVAROXABAN  (XARELTO ) VTE STARTER PACK (15 & 20 MG) Take 1 each by mouth as directed. Follow package directions: Take one 15mg  tablet by mouth twice a day. On day 22, switch to one 20mg  tablet once a day. Take with food. 51 each 0   QUEtiapine  (SEROQUEL ) 50 MG tablet Take 25 mg by mouth at bedtime. (Patient not taking: Reported on 02/15/2024)     No current facility-administered medications on file prior to visit.

## 2024-02-15 NOTE — Patient Instructions (Signed)
-  Continue rivaroxaban  (Xarelto ) 15 mg twice daily with food for 21 days followed by 20 mg daily with food. -Your refills have been sent to your [facility]. You may need to call the pharmacy to ask them to fill this when you start to run low on your current supply.  -It is important to take your medication around the same time every day.  -Avoid NSAIDs like ibuprofen  (Advil , Motrin ) and naproxen  (Aleve ) as well as aspirin doses over 100 mg daily. -Tylenol  (acetaminophen ) is the preferred over the counter pain medication to lower the risk of bleeding. -Be sure to alert all of your health care providers that you are taking an anticoagulant prior to starting a new medication or having a procedure. -Monitor for signs and symptoms of bleeding (abnormal bruising, prolonged bleeding, nose bleeds, bleeding from gums, discolored urine, black tarry stools). If you have fallen and hit your head OR if your bleeding is severe or not stopping, seek emergency care.  -Go to the emergency room if emergent signs and symptoms of new clot occur (new or worse swelling and pain in an arm or leg, shortness of breath, chest pain, fast or irregular heartbeats, lightheadedness, dizziness, fainting, coughing up blood) or if you experience a significant color change (pale or blue) in the extremity that has the DVT.  -We recommend you wear compression stockings (20-30 mmHg) as long as you are having swelling or pain. Be sure to purchase the correct size and take them off at night.   If you have any questions or need to reschedule an appointment, please call [phone]. If you are having an emergency, call 911 or present to the nearest emergency room.   What is a DVT?  -Deep vein thrombosis (DVT) is a condition in which a blood clot forms in a vein of the deep venous system which can occur in the lower leg, thigh, pelvis, arm, or neck. This condition is serious and can be life-threatening if the clot travels to the arteries of  the lungs and causing a blockage (pulmonary embolism, PE). A DVT can also damage veins in the leg, which can lead to long-term venous disease, leg pain, swelling, discoloration, and ulcers or sores (post-thrombotic syndrome).  -Treatment may include taking an anticoagulant medication to prevent more clots from forming and the current clot from growing, wearing compression stockings, and/or surgical procedures to remove or dissolve the clot.

## 2024-02-17 DIAGNOSIS — F332 Major depressive disorder, recurrent severe without psychotic features: Secondary | ICD-10-CM | POA: Diagnosis not present

## 2024-02-21 IMAGING — CT CT NECK W/ CM
4 of 5 series · 14 of 33 positions shown, 16 images · IV contrast (agent unspecified)
Comparison: None.

CLINICAL DATA: Neck mass, nonpulsatile

EXAM:
CT NECK WITH CONTRAST
TECHNIQUE: Multidetector CT imaging of the neck was performed using the
standard protocol following the bolus administration of intravenous
contrast.

[Series 4: axial bone · axial · 0.53mm/px · z∈[+1254,+1366]mm · 3 of 112 slices shown, 4 images]
[im 28/112  soft-tissue]
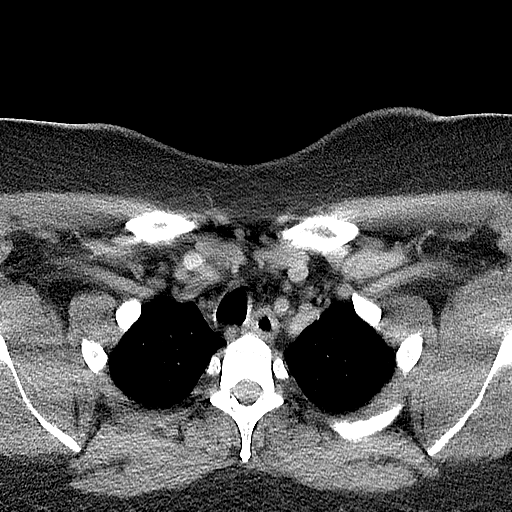
[im 28/112  bone]
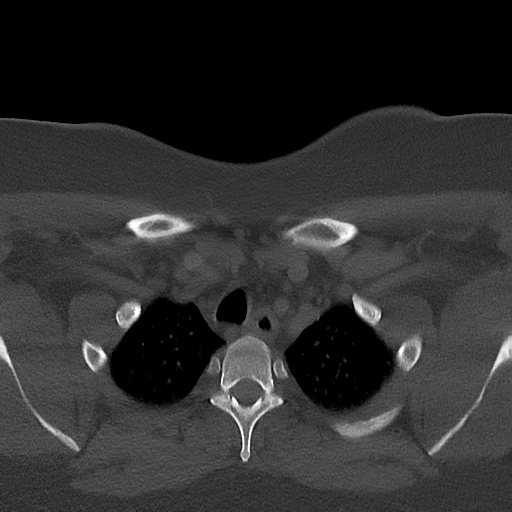
[im 56/112  bone]
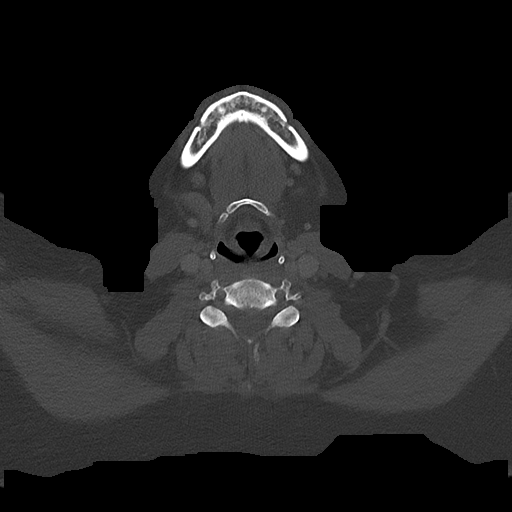
[im 84/112  bone]
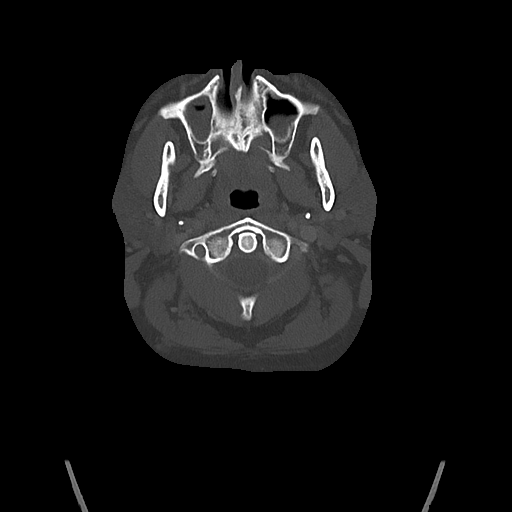

[Series 5: sag neck · sagittal · 0.51mm/px · 5 of 120 slices shown, 6 images]
[im 40/120  bone]
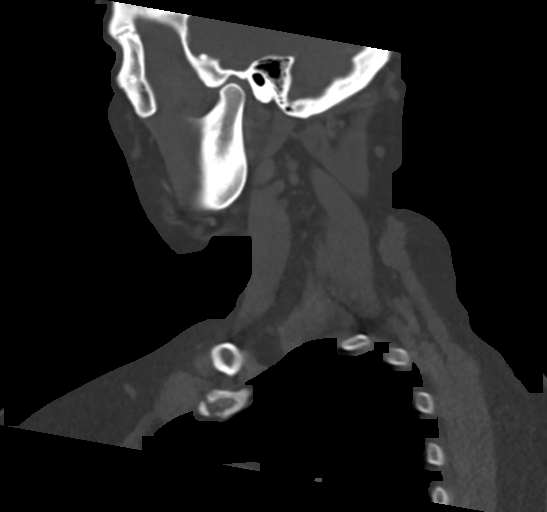
[im 50/120  bone]
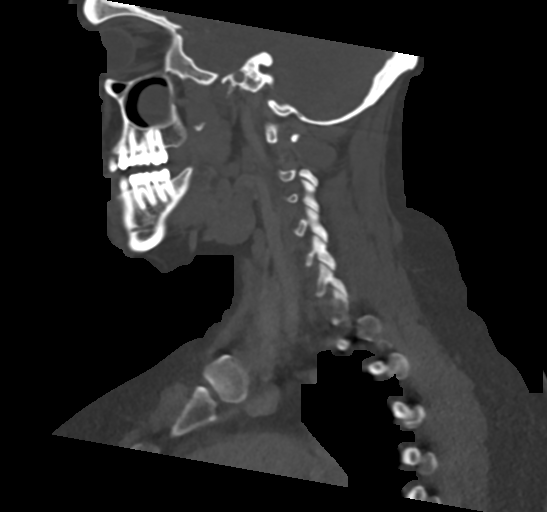
[im 60/120  soft-tissue]
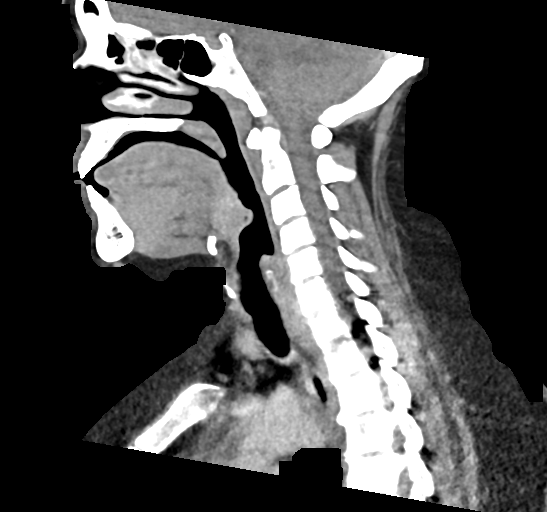
[im 60/120  bone]
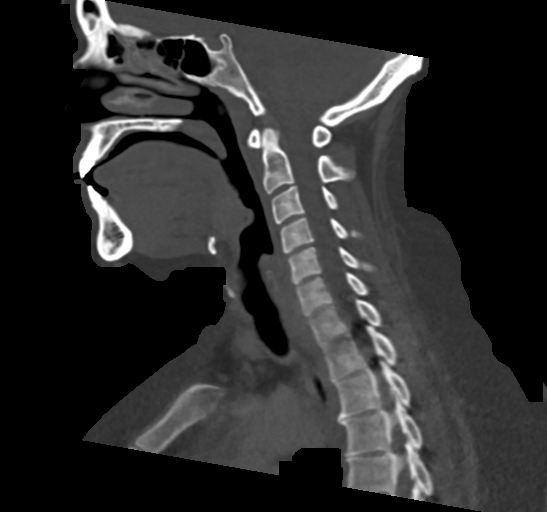
[im 70/120  bone]
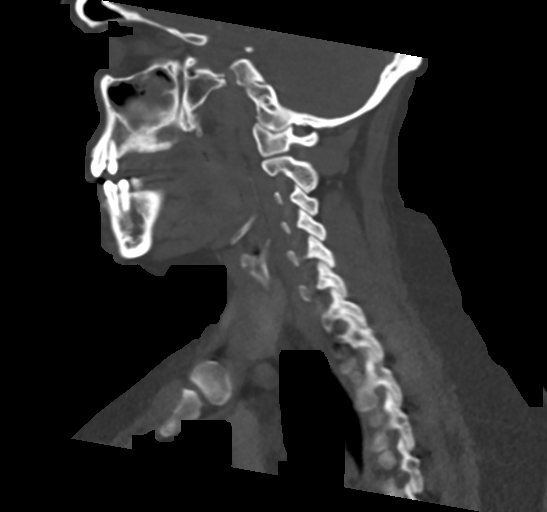
[im 80/120  bone]
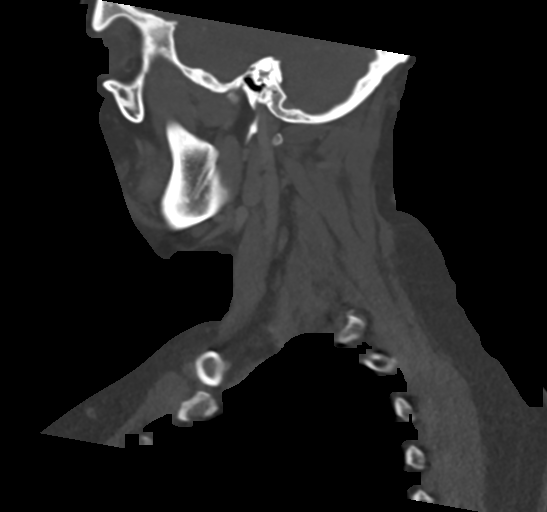

[Series 6: cor neck · coronal · 0.45mm/px · 3 of 155 slices shown]
[im 31/155  bone]
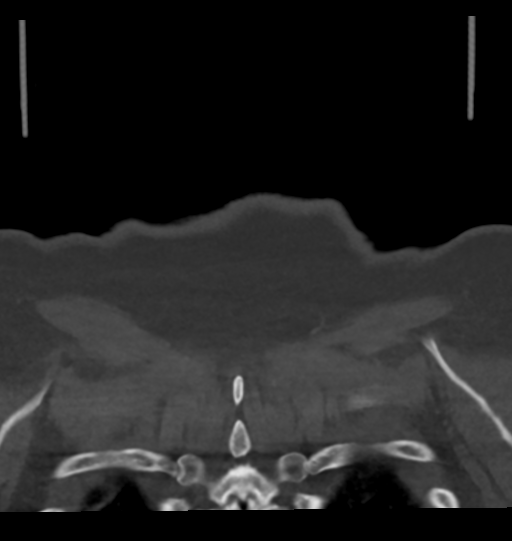
[im 62/155  bone]
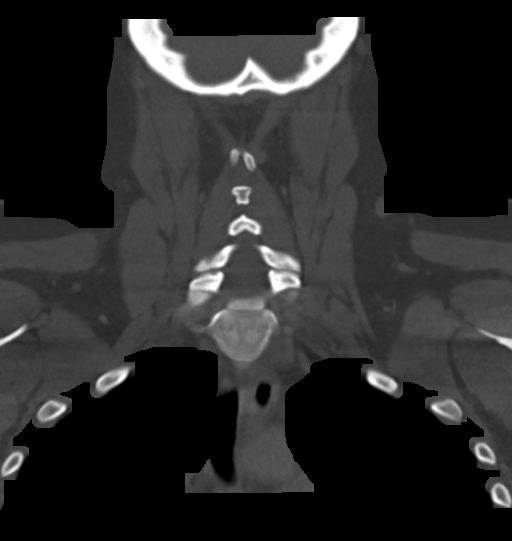
[im 93/155  bone]
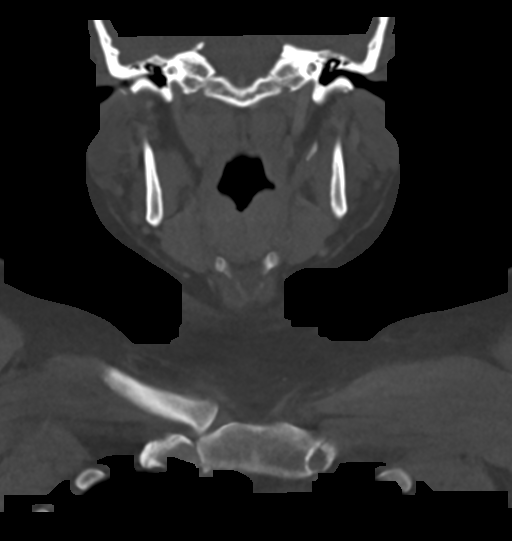

[Series 7: ax oropharynx · axial · 0.48mm/px · z∈[+1257,+1369]mm · 3 of 112 slices shown]
[im 28/112  bone]
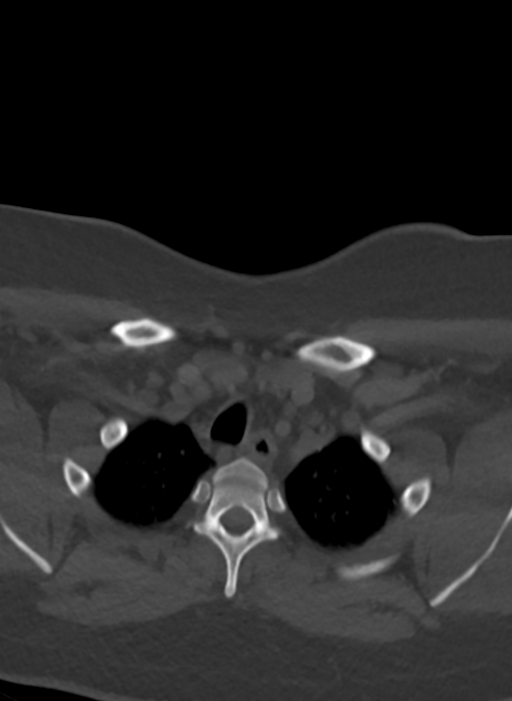
[im 56/112  bone]
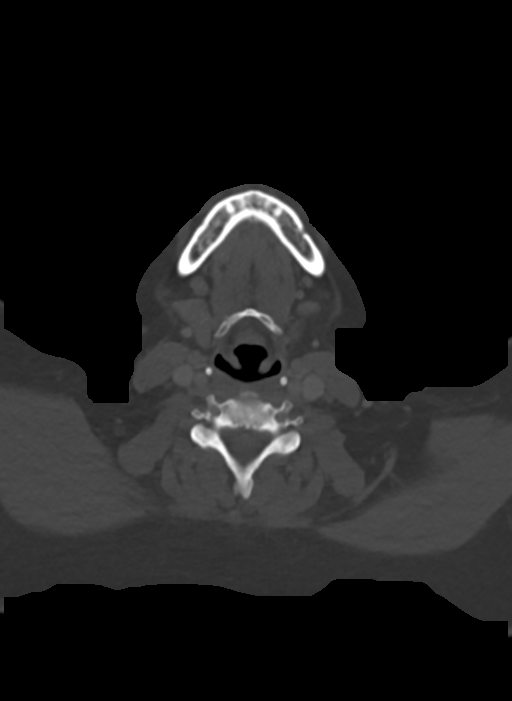
[im 84/112  bone]
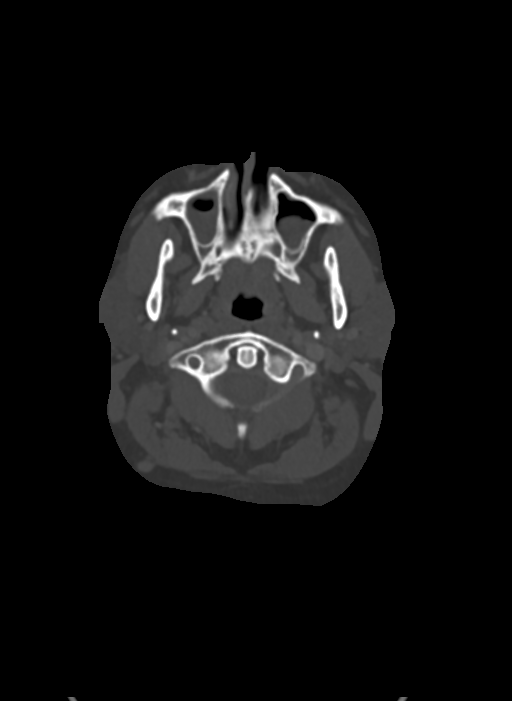

[14 of 33 positions shown; findings below may reference images not displayed]

RADIATION DOSE REDUCTION: This exam was performed according to the
departmental dose-optimization program which includes automated
exposure control, adjustment of the mA and/or kV according to
patient size and/or use of iterative reconstruction technique.

CONTRAST:  65mL OMNIPAQUE IOHEXOL 350 MG/ML SOLN
FINDINGS: Pharynx and larynx: Slight enhancement and soft tissue prominence at
the midline tongue base with effacement of the vallecula (for
example series 3, image 49; series 5, image 59). Otherwise, no
evidence of edema or mass.

Salivary glands: No inflammation, mass, or stone.

Thyroid: Normal.

Lymph nodes: None enlarged or abnormal density.

Vascular: Limited evaluation due to non arterial timing. Major
arteries in the neck are grossly patent.

Limited intracranial: Negative.

Visualized orbits: Negative.

Mastoids and visualized paranasal sinuses: Mucosal thickening of
ethmoid air cells and maxillary sinuses with probable left maxillary
sinus retention cyst.

Skeleton: No acute findings.

Upper chest: Visualized lung apices are clear.
IMPRESSION: 1. Slight enhancement and soft tissue prominence at the midline
tongue base with effacement of the vallecula. This is favored to
represent mildly prominent lingual tonsillar tissue; however,
recommend correlation with direct inspection to exclude malignancy.
2. Paranasal sinus disease, as above.

## 2024-02-29 ENCOUNTER — Other Ambulatory Visit: Payer: Self-pay | Admitting: Family

## 2024-02-29 DIAGNOSIS — I82619 Acute embolism and thrombosis of superficial veins of unspecified upper extremity: Secondary | ICD-10-CM

## 2024-03-01 ENCOUNTER — Inpatient Hospital Stay: Attending: Hematology & Oncology

## 2024-03-01 ENCOUNTER — Encounter: Payer: Self-pay | Admitting: Family

## 2024-03-01 ENCOUNTER — Other Ambulatory Visit: Payer: Self-pay

## 2024-03-01 ENCOUNTER — Inpatient Hospital Stay: Admitting: Family

## 2024-03-01 VITALS — BP 116/67 | HR 74 | Temp 98.6°F | Resp 18 | Ht 64.0 in | Wt 245.0 lb

## 2024-03-01 DIAGNOSIS — I82619 Acute embolism and thrombosis of superficial veins of unspecified upper extremity: Secondary | ICD-10-CM

## 2024-03-01 DIAGNOSIS — I82611 Acute embolism and thrombosis of superficial veins of right upper extremity: Secondary | ICD-10-CM

## 2024-03-01 DIAGNOSIS — Z7901 Long term (current) use of anticoagulants: Secondary | ICD-10-CM | POA: Diagnosis not present

## 2024-03-01 LAB — CMP (CANCER CENTER ONLY)
ALT: 37 U/L (ref 0–44)
AST: 27 U/L (ref 15–41)
Albumin: 4.2 g/dL (ref 3.5–5.0)
Alkaline Phosphatase: 86 U/L (ref 38–126)
Anion gap: 11 (ref 5–15)
BUN: 17 mg/dL (ref 6–20)
CO2: 25 mmol/L (ref 22–32)
Calcium: 8.7 mg/dL — ABNORMAL LOW (ref 8.9–10.3)
Chloride: 104 mmol/L (ref 98–111)
Creatinine: 0.79 mg/dL (ref 0.44–1.00)
GFR, Estimated: >60 mL/min
Glucose, Bld: 89 mg/dL (ref 70–99)
Potassium: 3.8 mmol/L (ref 3.5–5.1)
Sodium: 140 mmol/L (ref 135–145)
Total Bilirubin: 0.5 mg/dL (ref 0.0–1.2)
Total Protein: 6.7 g/dL (ref 6.5–8.1)

## 2024-03-01 LAB — CBC WITH DIFFERENTIAL (CANCER CENTER ONLY)
Abs Immature Granulocytes: 0.02 K/uL (ref 0.00–0.07)
Basophils Absolute: 0 K/uL (ref 0.0–0.1)
Basophils Relative: 0 %
Eosinophils Absolute: 0.2 K/uL (ref 0.0–0.5)
Eosinophils Relative: 3 %
HCT: 40.3 % (ref 36.0–46.0)
Hemoglobin: 13.7 g/dL (ref 12.0–15.0)
Immature Granulocytes: 0 %
Lymphocytes Relative: 34 %
Lymphs Abs: 2.4 K/uL (ref 0.7–4.0)
MCH: 31.9 pg (ref 26.0–34.0)
MCHC: 34 g/dL (ref 30.0–36.0)
MCV: 93.7 fL (ref 80.0–100.0)
Monocytes Absolute: 0.4 K/uL (ref 0.1–1.0)
Monocytes Relative: 6 %
Neutro Abs: 4.2 K/uL (ref 1.7–7.7)
Neutrophils Relative %: 57 %
Platelet Count: 267 K/uL (ref 150–400)
RBC: 4.3 MIL/uL (ref 3.87–5.11)
RDW: 12.1 % (ref 11.5–15.5)
WBC Count: 7.3 K/uL (ref 4.0–10.5)
nRBC: 0 % (ref 0.0–0.2)

## 2024-03-01 LAB — ANTITHROMBIN III: AntiThromb III Func: 98 % (ref 75–120)

## 2024-03-01 NOTE — Progress Notes (Signed)
 Hematology/Oncology Consultation   Name: Cynthia Frazier      MRN: [id number]    Location: Room/bed info not found  Date: 03/01/2024 Time:8:53 AM   REFERRING PHYSICIAN:  Gaile New, MD  REASON FOR CONSULT:  Superficial venous thrombosis of the right arm   DIAGNOSIS: Superficial thrombosis of right cephalic and basilic veins  HISTORY OF PRESENT ILLNESS: Cynthia Frazier is a pleasant 33 yo female with recent diagnosis of superficial thrombosis of the right cephalic and basilic veins extending to just before the confluence with the deep system.  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  She had an IV infiltrate during her October hospitalization in the same arm she has the thrombus which may have contributed.  No smoking, ETOH or recreational drug use.  No birth control use.  No travel around thrombotic events.  She has prior history of upper extremity in 2018 post gallbladder surgery related to IV placement. She also has history of RLE DVT during pregnancy in 2017 treated with Xarelto .  She is followed now by vascular surgery as needed. She last saw them on 02/15/2024.  She has 1 son but prior to his birth she had 3 miscarriages within the first trimester as well as 1 still birth due to hydrops fetalis.  She has occasional migraine. This improved with the birth of her son 8 years ago.  She states that her cycle is regular and flow heavy. He flow has been heavier recently with Xarelto .  She notes dizziness and SOB when standing while on her cycle. She also has associated numbness and tingling in her hands and feet  She has history of juvenile arthritis. This resolved as she grew.  Patient is adopted and has little family history.  No personal history of cancer.  No history of diabetes or thyroid  disease.  No fever, chills, n/v, cough, rash,  dizziness, SOB, chest pain, palpitations, abdominal pain or changes in bowel or bladder habits at this time.  No swelling, numbness or tingling in her extremities at this time.  Tenderness in the right arm improved.  No falls or syncope.  Appetite and hydration are good. Weight is stable at 245 lbs.   ROS: All other 10 point review of systems is negative.   PAST MEDICAL HISTORY:   Past Medical History:  Diagnosis Date   Anxiety    Depression    Dysphagia    Left upper quadrant abdominal pain 07/11/2020   Nausea and vomiting 07/11/2020   Throat pain    Vitamin D deficiency     ALLERGIES: Allergies[1]    MEDICATIONS:  Medications Ordered Prior to Encounter[2]   PAST SURGICAL HISTORY Past Surgical History:  Procedure Laterality Date   CESAREAN SECTION     CHOLECYSTECTOMY      FAMILY HISTORY: Family History  Adopted: Yes    SOCIAL HISTORY:  reports that she has never smoked. She has never used smokeless tobacco. She reports current alcohol use. She reports that she does not use drugs.  PERFORMANCE STATUS: The patient's performance status is 1 - Symptomatic but completely ambulatory  PHYSICAL EXAM: Most Recent Vital Signs: There were no vitals taken for this visit. There were no vitals taken for this visit.  General Appearance:    Alert, cooperative, no distress, appears stated age  Head:    Normocephalic, without obvious abnormality, atraumatic  Eyes:  PERRL, conjunctiva/corneas clear, EOM's intact, fundi    benign, both eyes        Throat:   Lips, mucosa, and tongue normal; teeth and gums normal  Neck:   Supple, symmetrical, trachea midline, no adenopathy;    thyroid :  no enlargement/tenderness/nodules; no carotid   bruit or JVD  Back:     Symmetric, no curvature, ROM normal, no CVA tenderness  Lungs:     Clear to auscultation bilaterally, respirations unlabored  Chest Wall:    No tenderness or deformity   Heart:    Regular rate and rhythm, S1 and S2 normal,  no murmur, rub   or gallop     Abdomen:     Soft, non-tender, bowel sounds active all four quadrants,    no masses, no organomegaly        Extremities:   Extremities normal, atraumatic, no cyanosis or edema  Pulses:   2+ and symmetric all extremities  Skin:   Skin color, texture, turgor normal, no rashes or lesions  Lymph nodes:   Cervical, supraclavicular, and axillary nodes normal  Neurologic:   CNII-XII intact, normal strength, sensation and reflexes    throughout    LABORATORY DATA:  No results found for this or any previous visit (from the past 48 hours).    RADIOGRAPHY: No results found.     PATHOLOGY: None   ASSESSMENT/PLAN: Cynthia Frazier is a pleasant 33 yo female with history of 3 separate thrombotic events (2017, 2018 and 2025).  Hyper coag pending.  She will continue her same regimen with Xarelto  20 mg PO daily.  We will plan to follow-up with repeat US  and visit in late February to assess her response.  If thrombus has resolved at that time we will look at reducing her dose of Xarelto  to 10 mg PO daily.   All questions were answered. The patient knows to call the clinic with any problems, questions or concerns. We can certainly see the patient much sooner if necessary.  The patient was discussed with Dr. Timmy and he is in agreement with the aforementioned.   Lauraine Pepper, NP           [1]  Allergies Allergen Reactions   Latex Anaphylaxis, Hives and Dermatitis   Sumatriptan Anaphylaxis and Hives   Vancomycin Itching, Swelling and Other (See Comments)    Made the mouth itch and lips swell   Other Other (See Comments)    Patient has Eosinophilic esophagitis, so she normally AVOIDS wheat, eggs, and dairy.   Avocado Rash   Banana Rash   Kiwi Extract Rash   Levofloxacin Rash and Hypertension  [2]  Current Outpatient Medications on File Prior to Visit  Medication Sig Dispense Refill   acyclovir  (ZOVIRAX ) 800 MG tablet Take 400 mg by mouth in the morning  and at bedtime.     busPIRone  (BUSPAR ) 15 MG tablet Take 15 mg by mouth 3 (three) times daily.     clonazePAM  (KLONOPIN ) 0.5 MG tablet Take 0.5 mg by mouth daily as needed for anxiety (or sleep).     escitalopram  (LEXAPRO ) 10 MG tablet Take 10 mg by mouth daily.     estradiol (ESTRACE) 0.01 % CREA vaginal cream Place vaginally See admin instructions. Apply to affected (vaginal) area up to three times a week     fexofenadine  (ALLEGRA ) 180 MG tablet Take 180 mg by mouth in the morning.     fluticasone -salmeterol (ADVAIR DISKUS) 250-50 MCG/ACT AEPB 1 puff See admin instructions. Inhale  1 puff into the mouth and swallow for Eosinophilic esophagitis one to two times a day     Multiple Vitamins-Calcium (ONE-A-DAY WOMENS FORMULA) TABS Take 1 tablet by mouth daily with breakfast.     Olopatadine  HCl 0.2 % SOLN Apply 1 drop to eye 2 (two) times daily. 2.5 mL 5   ondansetron  (ZOFRAN -ODT) 4 MG disintegrating tablet Take 1 tablet (4 mg total) by mouth every 8 (eight) hours as needed. 20 tablet 0   pantoprazole  (PROTONIX ) 40 MG tablet Take 40 mg by mouth daily as needed (for reflux symptoms- in the morning/30 minutes before breakfast). (Patient taking differently: Take 40 mg by mouth daily.)     prazosin  (MINIPRESS ) 5 MG capsule Take 5 mg by mouth at bedtime as needed (for nightmares). (Patient taking differently: Take 5 mg by mouth at bedtime.)     promethazine  (PHENERGAN ) 25 MG tablet Take 1 tablet (25 mg total) by mouth every 6 (six) hours as needed for nausea or vomiting. 30 tablet 0   QUEtiapine  (SEROQUEL ) 50 MG tablet Take 25 mg by mouth at bedtime. (Patient not taking: Reported on 02/15/2024)     rivaroxaban  (XARELTO ) 20 MG TABS tablet Take 1 tablet (20 mg total) by mouth daily with supper. Take with food. Start taking after completion of starter pack. 30 tablet 1   RIVAROXABAN  (XARELTO ) VTE STARTER PACK (15 & 20 MG) Take 1 each by mouth as directed. Follow package directions: Take one 15mg  tablet by mouth  twice a day. On day 22, switch to one 20mg  tablet once a day. Take with food. 51 each 0   No current facility-administered medications on file prior to visit.

## 2024-03-02 LAB — CARDIOLIPIN ANTIBODIES, IGG, IGM, IGA
Anticardiolipin IgA: <9 U/mL (ref 0–11)
Anticardiolipin IgG: <9 GPL U/mL (ref 0–14)
Anticardiolipin IgM: 28 [MPL'U]/mL — ABNORMAL HIGH (ref 0–12)

## 2024-03-03 LAB — PROTEIN C ACTIVITY: Protein C Activity: 74 % (ref 73–180)

## 2024-03-03 LAB — PROTEIN S, TOTAL: Protein S Ag, Total: 73 % (ref 60–150)

## 2024-03-03 LAB — DRVVT CONFIRM: dRVVT Confirm: 1.5 ratio — ABNORMAL HIGH (ref 0.8–1.2)

## 2024-03-03 LAB — LUPUS ANTICOAGULANT PANEL
DRVVT: 172.5 s — ABNORMAL HIGH (ref 0.0–47.0)
PTT Lupus Anticoagulant: 60.1 s — ABNORMAL HIGH (ref 0.0–43.5)

## 2024-03-03 LAB — BETA-2-GLYCOPROTEIN I ABS, IGG/M/A
Beta-2 Glyco I IgG: <9 GPI IgG units (ref 0–20)
Beta-2-Glycoprotein I IgA: <9 GPI IgA units (ref 0–25)
Beta-2-Glycoprotein I IgM: <9 GPI IgM units (ref 0–32)

## 2024-03-03 LAB — DRVVT MIX: dRVVT Mix: 121.6 s — ABNORMAL HIGH (ref 0.0–40.4)

## 2024-03-03 LAB — HEXAGONAL PHASE PHOSPHOLIPID: Hexagonal Phase Phospholipid: 6 s (ref 0–11)

## 2024-03-03 LAB — PTT-LA MIX: PTT-LA Mix: 52.2 s — ABNORMAL HIGH (ref 0.0–40.5)

## 2024-03-03 LAB — PROTEIN S ACTIVITY: Protein S Activity: 127 % (ref 63–140)

## 2024-03-04 LAB — HOMOCYSTEINE: Homocysteine: 9.6 umol/L (ref 0.0–14.5)

## 2024-03-04 LAB — FACTOR 5 LEIDEN

## 2024-03-04 LAB — PROTHROMBIN GENE MUTATION

## 2024-03-05 LAB — PROTEIN C, TOTAL: Protein C, Total: 41 % — ABNORMAL LOW (ref 60–150)

## 2024-03-07 ENCOUNTER — Encounter: Payer: Self-pay | Admitting: Family

## 2024-03-08 ENCOUNTER — Telehealth: Payer: Self-pay | Admitting: Family

## 2024-03-08 NOTE — Telephone Encounter (Signed)
 I was able to speak with Ms. Ontko and go over hyper coag results which included positive lupus anticoagulant, low protein C total and mildly increased anticardiolipin IgM level. These had also been reviewed with Dr. Timmy. We recommend she remain on full dose Xarelto  for 1 year and then consider reducing to maintenance 10 mg Po daily lifelong secondary to her multiple thrombotic events and miscarriages. Patient in agreement.  She will be transferring her care to [facility] due to insurance coverage. She will let us  know if she needs any records forwarded to the new office. No questions or concerns at this time. Pateint appreciative of call.

## 2024-03-28 IMAGING — DX DG CHEST 2V
2 series · 2 of 2 positions shown · non-contrast
Comparison: None.

CLINICAL DATA: Cough, shortness of breath.

EXAM:
CHEST - 2 VIEW

[chest pa]
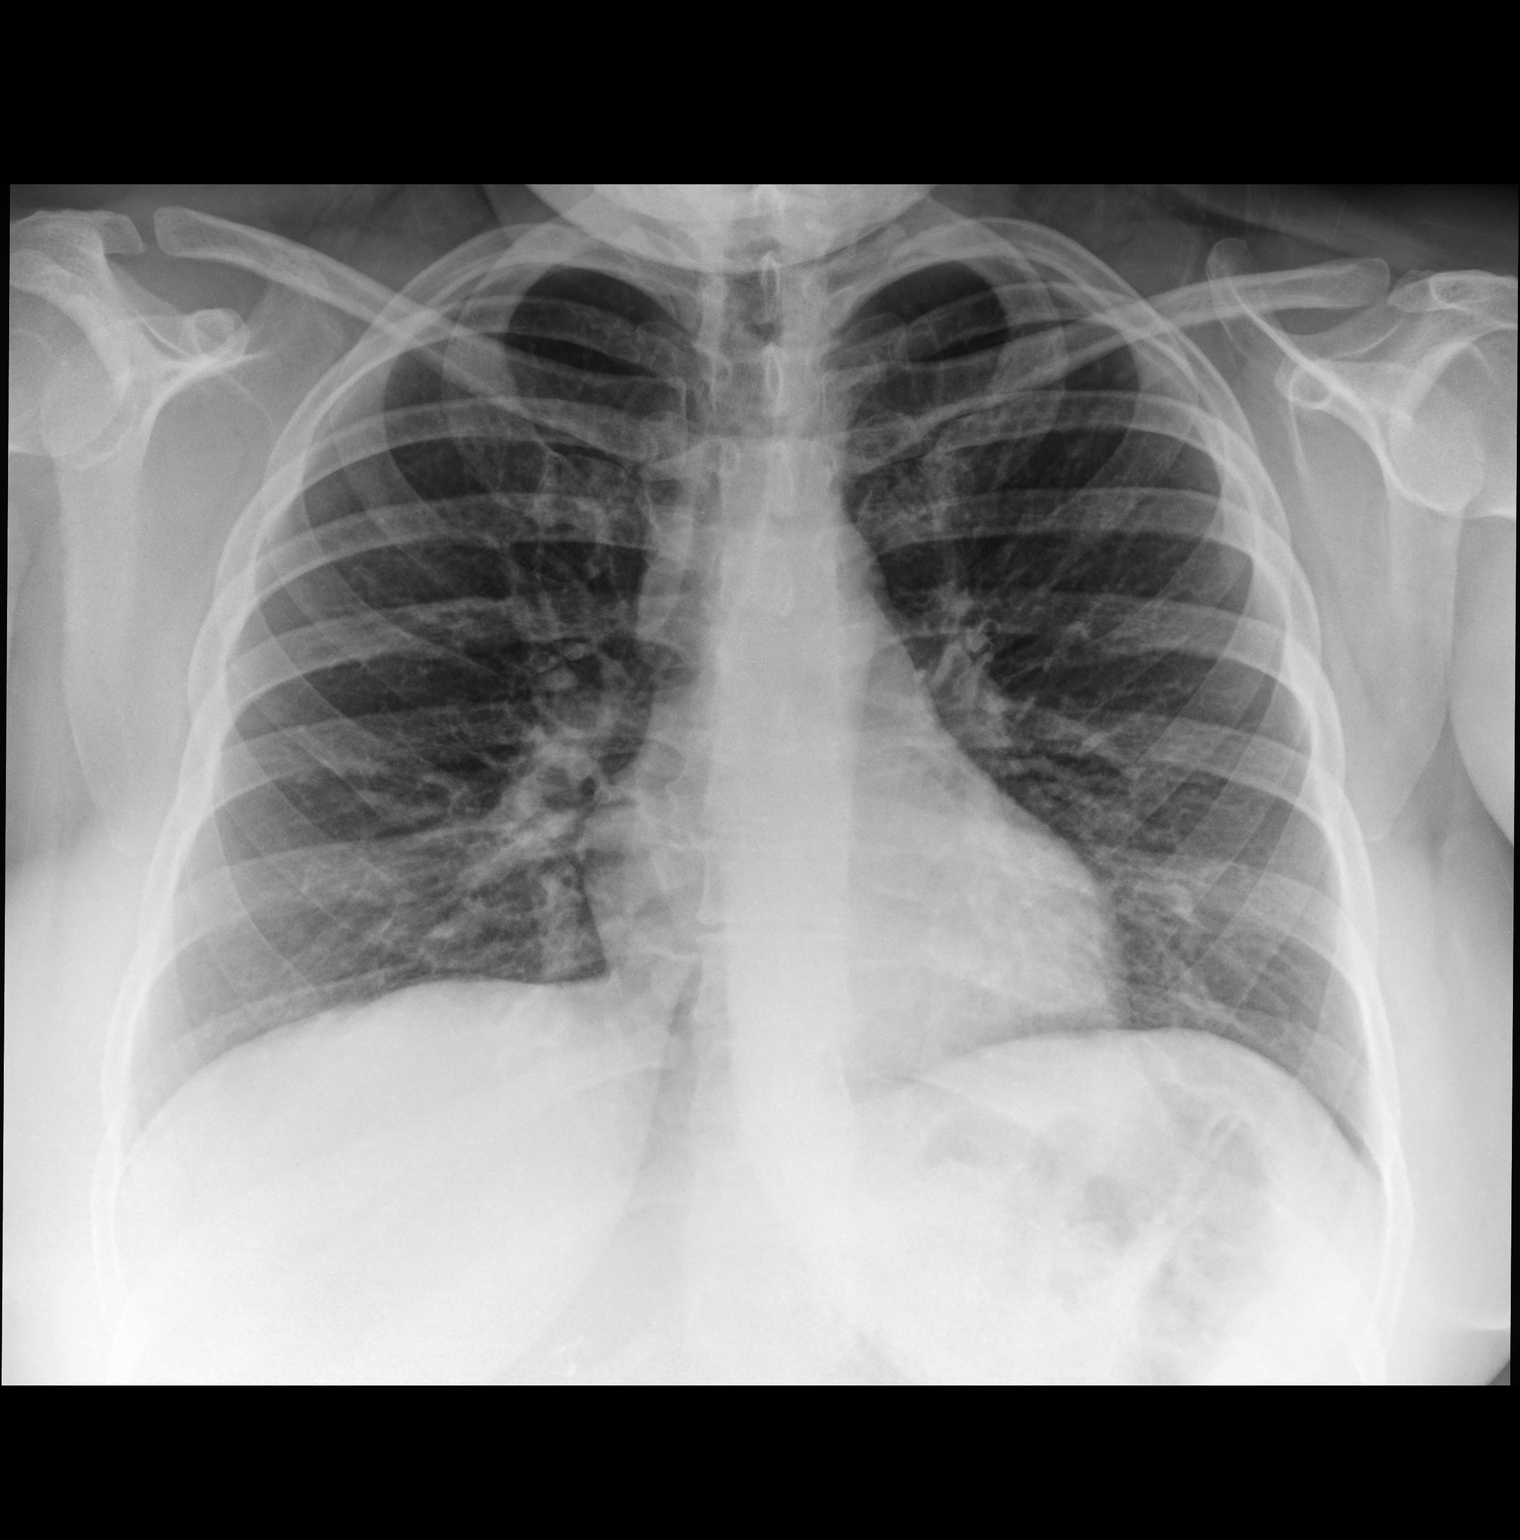

[chest lat]
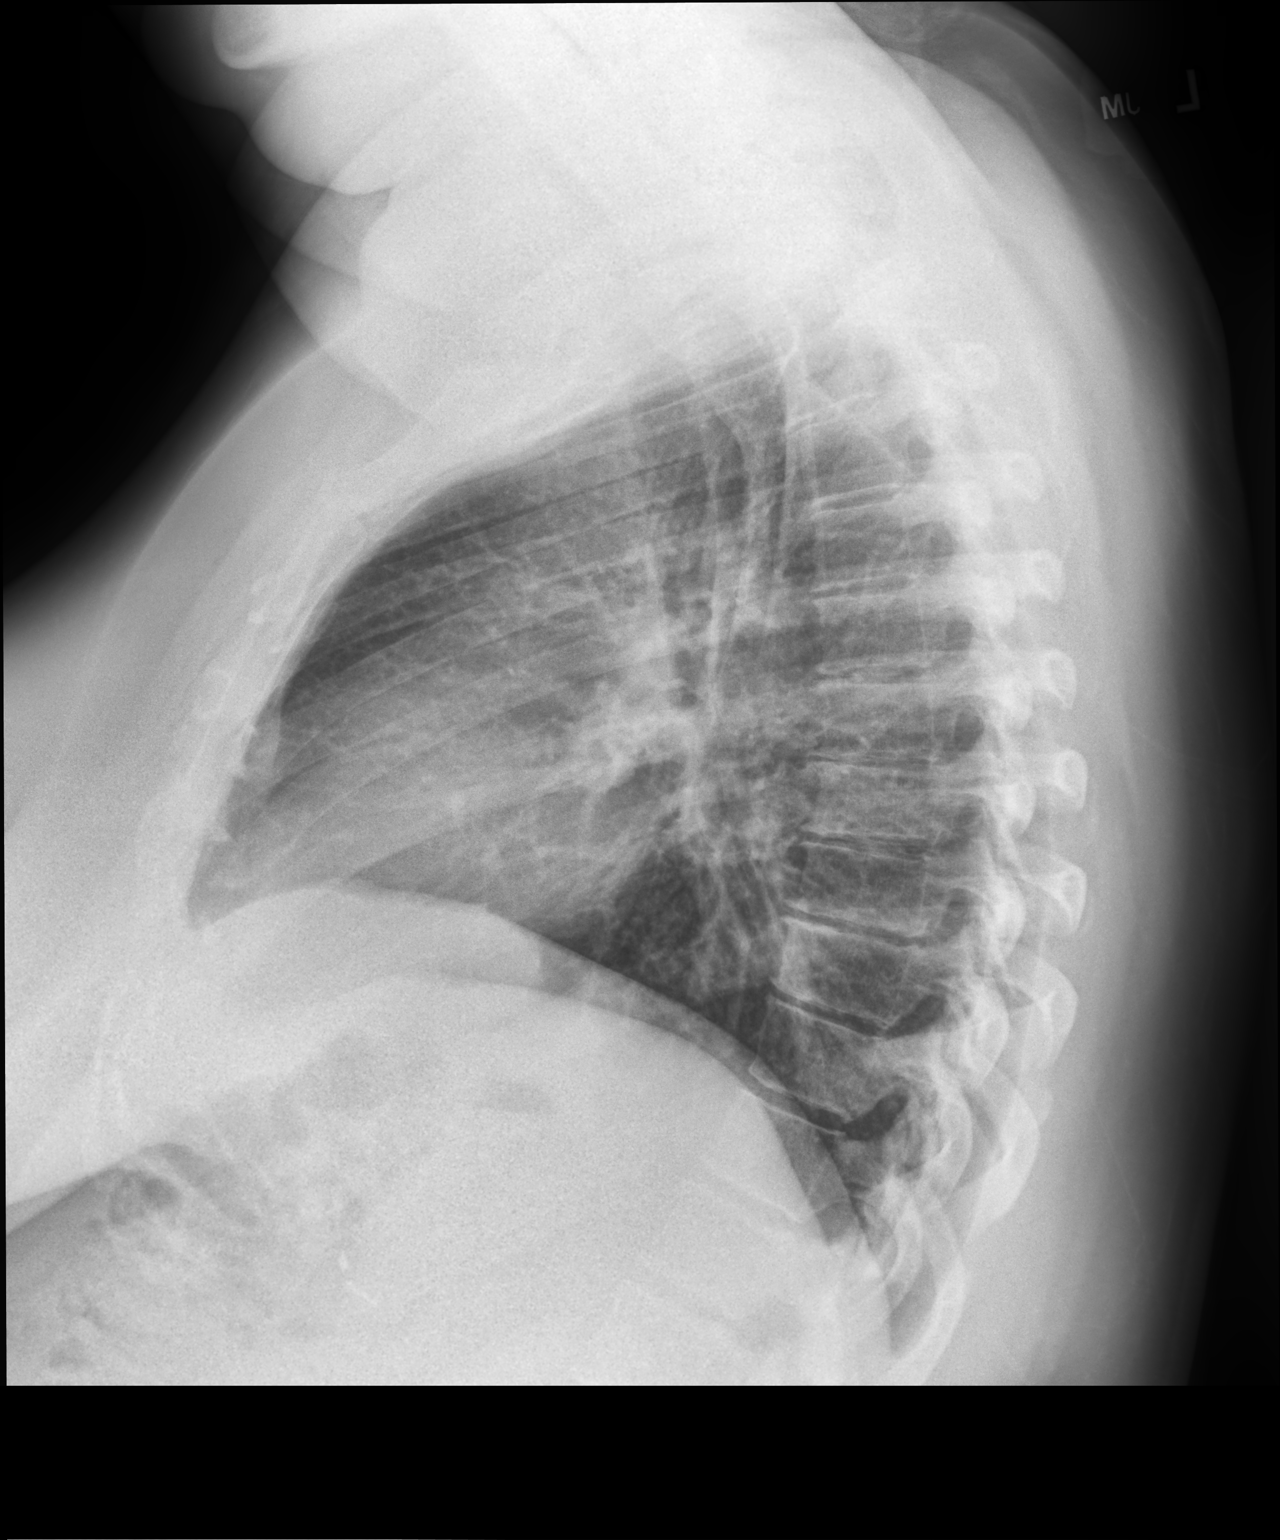

[2 of 2 positions shown; findings below may reference images not displayed]

FINDINGS: The heart size and mediastinal contours are within normal limits.
Both lungs are clear. The visualized skeletal structures are
unremarkable.
IMPRESSION: No active cardiopulmonary disease.

## 2024-04-25 ENCOUNTER — Inpatient Hospital Stay

## 2024-04-25 ENCOUNTER — Inpatient Hospital Stay: Admitting: Family

## 2024-04-25 ENCOUNTER — Other Ambulatory Visit (HOSPITAL_BASED_OUTPATIENT_CLINIC_OR_DEPARTMENT_OTHER)
